# Patient Record
Sex: Female | Born: 1985
Health system: Southern US, Community
[De-identification: ages and names within clinical notes are randomized; demographics above are authoritative.]

## PROBLEM LIST (undated history)

## (undated) DIAGNOSIS — Z98891 History of uterine scar from previous surgery: Secondary | ICD-10-CM

## (undated) DIAGNOSIS — O3110X0 Continuing pregnancy after spontaneous abortion of one fetus or more, unspecified trimester, not applicable or unspecified: Secondary | ICD-10-CM

## (undated) DIAGNOSIS — F32A Depression, unspecified: Secondary | ICD-10-CM

## (undated) DIAGNOSIS — N942 Vaginismus: Secondary | ICD-10-CM

## (undated) DIAGNOSIS — E559 Vitamin D deficiency, unspecified: Secondary | ICD-10-CM

## (undated) DIAGNOSIS — K219 Gastro-esophageal reflux disease without esophagitis: Secondary | ICD-10-CM

## (undated) DIAGNOSIS — N979 Female infertility, unspecified: Secondary | ICD-10-CM

## (undated) DIAGNOSIS — Z8489 Family history of other specified conditions: Secondary | ICD-10-CM

## (undated) DIAGNOSIS — E282 Polycystic ovarian syndrome: Secondary | ICD-10-CM

## (undated) DIAGNOSIS — Z8759 Personal history of other complications of pregnancy, childbirth and the puerperium: Secondary | ICD-10-CM

## (undated) DIAGNOSIS — F419 Anxiety disorder, unspecified: Secondary | ICD-10-CM

## (undated) DIAGNOSIS — R7303 Prediabetes: Secondary | ICD-10-CM

## (undated) HISTORY — DX: Personal history of other complications of pregnancy, childbirth and the puerperium: Z87.59

## (undated) HISTORY — DX: Prediabetes: R73.03

## (undated) HISTORY — DX: Vaginismus: N94.2

## (undated) HISTORY — DX: Vitamin D deficiency, unspecified: E55.9

## (undated) HISTORY — DX: Anxiety disorder, unspecified: F41.9

## (undated) HISTORY — DX: History of uterine scar from previous surgery: Z98.891

## (undated) HISTORY — DX: Female infertility, unspecified: N97.9

## (undated) HISTORY — DX: Continuing pregnancy after spontaneous abortion of one fetus or more, unspecified trimester, not applicable or unspecified: O31.10X0

## (undated) HISTORY — DX: Gastro-esophageal reflux disease without esophagitis: K21.9

## (undated) HISTORY — DX: Polycystic ovarian syndrome: E28.2

## (undated) HISTORY — DX: Depression, unspecified: F32.A

---

## 2007-09-12 ENCOUNTER — Emergency Department (HOSPITAL_COMMUNITY): Admission: EM | Admit: 2007-09-12 | Discharge: 2007-09-12 | Payer: Self-pay | Admitting: Emergency Medicine

## 2009-05-08 ENCOUNTER — Other Ambulatory Visit: Admission: RE | Admit: 2009-05-08 | Discharge: 2009-05-08 | Payer: Self-pay | Admitting: Obstetrics and Gynecology

## 2010-05-05 ENCOUNTER — Encounter: Payer: Self-pay | Admitting: Emergency Medicine

## 2010-11-06 ENCOUNTER — Other Ambulatory Visit: Payer: Self-pay | Admitting: Otolaryngology

## 2010-11-12 ENCOUNTER — Ambulatory Visit
Admission: RE | Admit: 2010-11-12 | Discharge: 2010-11-12 | Disposition: A | Discharge status: Home / Self Care | Payer: BC Managed Care – PPO | Source: Ambulatory Visit | Attending: Otolaryngology | Admitting: Otolaryngology

## 2011-01-08 LAB — POCT PREGNANCY, URINE
Operator id: 244461
Preg Test, Ur: NEGATIVE

## 2012-07-28 ENCOUNTER — Ambulatory Visit: Payer: BC Managed Care – PPO | Admitting: Physical Therapy

## 2012-08-11 ENCOUNTER — Ambulatory Visit: Payer: BC Managed Care – PPO | Attending: Obstetrics and Gynecology | Admitting: Physical Therapy

## 2012-08-11 DIAGNOSIS — M2420 Disorder of ligament, unspecified site: Secondary | ICD-10-CM | POA: Insufficient documentation

## 2012-08-11 DIAGNOSIS — IMO0002 Reserved for concepts with insufficient information to code with codable children: Secondary | ICD-10-CM | POA: Insufficient documentation

## 2012-08-11 DIAGNOSIS — IMO0001 Reserved for inherently not codable concepts without codable children: Secondary | ICD-10-CM | POA: Insufficient documentation

## 2012-08-11 DIAGNOSIS — M629 Disorder of muscle, unspecified: Secondary | ICD-10-CM | POA: Insufficient documentation

## 2012-08-15 ENCOUNTER — Ambulatory Visit: Payer: BC Managed Care – PPO | Admitting: Physical Therapy

## 2012-08-23 ENCOUNTER — Ambulatory Visit: Payer: BC Managed Care – PPO | Admitting: Physical Therapy

## 2012-08-25 ENCOUNTER — Other Ambulatory Visit: Payer: Self-pay | Admitting: Family Medicine

## 2012-08-25 ENCOUNTER — Encounter: Payer: BC Managed Care – PPO | Admitting: Physical Therapy

## 2012-08-25 DIAGNOSIS — R635 Abnormal weight gain: Secondary | ICD-10-CM

## 2012-08-29 ENCOUNTER — Other Ambulatory Visit: Payer: BC Managed Care – PPO

## 2012-08-30 ENCOUNTER — Ambulatory Visit: Payer: BC Managed Care – PPO | Admitting: Physical Therapy

## 2012-09-01 ENCOUNTER — Ambulatory Visit: Payer: BC Managed Care – PPO | Admitting: Physical Therapy

## 2012-09-06 ENCOUNTER — Ambulatory Visit: Payer: BC Managed Care – PPO | Admitting: Physical Therapy

## 2012-09-08 ENCOUNTER — Ambulatory Visit: Payer: BC Managed Care – PPO | Admitting: Physical Therapy

## 2012-09-13 ENCOUNTER — Ambulatory Visit: Payer: BC Managed Care – PPO | Admitting: Physical Therapy

## 2012-09-15 ENCOUNTER — Ambulatory Visit: Payer: BC Managed Care – PPO | Attending: Obstetrics and Gynecology | Admitting: Physical Therapy

## 2012-09-15 DIAGNOSIS — M629 Disorder of muscle, unspecified: Secondary | ICD-10-CM | POA: Insufficient documentation

## 2012-09-15 DIAGNOSIS — IMO0001 Reserved for inherently not codable concepts without codable children: Secondary | ICD-10-CM | POA: Insufficient documentation

## 2012-09-15 DIAGNOSIS — IMO0002 Reserved for concepts with insufficient information to code with codable children: Secondary | ICD-10-CM | POA: Insufficient documentation

## 2012-09-15 DIAGNOSIS — M2420 Disorder of ligament, unspecified site: Secondary | ICD-10-CM | POA: Insufficient documentation

## 2012-09-19 ENCOUNTER — Other Ambulatory Visit: Payer: BC Managed Care – PPO

## 2012-09-20 ENCOUNTER — Ambulatory Visit: Payer: BC Managed Care – PPO | Admitting: Physical Therapy

## 2012-09-21 ENCOUNTER — Other Ambulatory Visit: Payer: BC Managed Care – PPO

## 2012-09-22 ENCOUNTER — Ambulatory Visit: Payer: BC Managed Care – PPO | Admitting: Physical Therapy

## 2012-09-27 ENCOUNTER — Ambulatory Visit: Payer: BC Managed Care – PPO | Admitting: Physical Therapy

## 2012-09-29 ENCOUNTER — Encounter: Payer: BC Managed Care – PPO | Admitting: Physical Therapy

## 2012-09-30 ENCOUNTER — Ambulatory Visit
Admission: RE | Admit: 2012-09-30 | Discharge: 2012-09-30 | Disposition: A | Discharge status: Home / Self Care | Payer: BC Managed Care – PPO | Source: Ambulatory Visit | Attending: Family Medicine | Admitting: Family Medicine

## 2012-09-30 DIAGNOSIS — R635 Abnormal weight gain: Secondary | ICD-10-CM

## 2012-10-04 ENCOUNTER — Ambulatory Visit: Payer: BC Managed Care – PPO | Admitting: Physical Therapy

## 2012-10-06 ENCOUNTER — Ambulatory Visit: Payer: BC Managed Care – PPO | Admitting: Physical Therapy

## 2013-01-04 ENCOUNTER — Emergency Department (HOSPITAL_COMMUNITY): Payer: BC Managed Care – PPO

## 2013-01-04 ENCOUNTER — Encounter (HOSPITAL_COMMUNITY): Payer: Self-pay | Admitting: Nurse Practitioner

## 2013-01-04 ENCOUNTER — Emergency Department (HOSPITAL_COMMUNITY)
Admission: EM | Admit: 2013-01-04 | Discharge: 2013-01-04 | Disposition: A | Discharge status: Home / Self Care | Payer: BC Managed Care – PPO | Attending: Emergency Medicine | Admitting: Emergency Medicine

## 2013-01-04 DIAGNOSIS — R109 Unspecified abdominal pain: Secondary | ICD-10-CM | POA: Insufficient documentation

## 2013-01-04 DIAGNOSIS — N949 Unspecified condition associated with female genital organs and menstrual cycle: Secondary | ICD-10-CM | POA: Insufficient documentation

## 2013-01-04 DIAGNOSIS — N921 Excessive and frequent menstruation with irregular cycle: Secondary | ICD-10-CM | POA: Insufficient documentation

## 2013-01-04 DIAGNOSIS — R102 Pelvic and perineal pain: Secondary | ICD-10-CM

## 2013-01-04 DIAGNOSIS — N898 Other specified noninflammatory disorders of vagina: Secondary | ICD-10-CM | POA: Insufficient documentation

## 2013-01-04 DIAGNOSIS — Z3202 Encounter for pregnancy test, result negative: Secondary | ICD-10-CM | POA: Insufficient documentation

## 2013-01-04 LAB — URINE MICROSCOPIC-ADD ON

## 2013-01-04 LAB — URINALYSIS, ROUTINE W REFLEX MICROSCOPIC
Bilirubin Urine: NEGATIVE
Ketones, ur: NEGATIVE mg/dL
Nitrite: NEGATIVE
Urobilinogen, UA: 0.2 mg/dL (ref 0.0–1.0)
pH: 5 (ref 5.0–8.0)

## 2013-01-04 LAB — POCT PREGNANCY, URINE: Preg Test, Ur: NEGATIVE

## 2013-01-04 LAB — WET PREP, GENITAL: Trich, Wet Prep: NONE SEEN

## 2013-01-04 MED ORDER — HYDROCODONE-ACETAMINOPHEN 5-325 MG PO TABS
1.0000 | ORAL_TABLET | Freq: Once | ORAL | Status: AC
  Administered 2013-01-04: 1 via ORAL
  Filled 2013-01-04: qty 1

## 2013-01-04 MED ORDER — PHENAZOPYRIDINE HCL 200 MG PO TABS: 200.0000 mg | ORAL_TABLET | Freq: Three times a day (TID) | ORAL | Status: DC

## 2013-01-04 MED ORDER — PHENAZOPYRIDINE HCL 100 MG PO TABS
100.0000 mg | ORAL_TABLET | Freq: Once | ORAL | Status: AC
  Administered 2013-01-04: 100 mg via ORAL
  Filled 2013-01-04: qty 1

## 2013-01-04 MED ORDER — TRAMADOL HCL 50 MG PO TABS: 50.0000 mg | ORAL_TABLET | Freq: Four times a day (QID) | ORAL | Status: DC | PRN

## 2013-01-04 NOTE — ED Provider Notes (Signed)
CSN: [id number]     Arrival date & time 01/04/13  1044 History   First MD Initiated Contact with Patient 01/04/13 1201     Chief Complaint  Patient presents with  . Pelvic Pain   (Consider location/radiation/quality/duration/timing/severity/associated sxs/prior Treatment) Patient is a 27 y.o. female presenting with pelvic pain. The history is provided by the patient and medical records.  Pelvic Pain  Patient presents to the ED for pelvic pain and abnormal menstrual cycle. Patient states she has not had a menstrual cycle in over 2 months but has had some slight spotting. She states her cycles are abnormal but she never goes more than 1 month without a cycle. She's not currently on any type of birth control. Patient has taken 2 home pregnancy tests, both of which were negative. Patient states she is followed by OB-GYN, Dr. Vickey Sages, but has not had a pap smear this year due to pelvic spasms. Her OB/GYN sent her to physical therapy, which she completed but did not notice any improvement.  Pt states pain starts below her navel and progresses all the way down to the entrance of her vagina.  Pain described as a sharp sensation, exacerbated during intercourse.  Has had some intermittent nausea but no vomiting.  No urinary sx or vaginal discharge.  No new sexual partners or concern for STD.  BM and PO intake normal.  Denies hx of ovarian cysts, endometriosis, or other GYN conditions.  No recent fevers, sweats, or chills.  History reviewed. No pertinent past medical history. History reviewed. No pertinent past surgical history. History reviewed. No pertinent family history. History  Substance Use Topics  . Smoking status: Never Smoker   . Smokeless tobacco: Not on file  . Alcohol Use: No   OB History   Grav Para Term Preterm Abortions TAB SAB Ect Mult Living                 Review of Systems  Genitourinary: Positive for menstrual problem and pelvic pain.  All other systems reviewed and are  negative.    Allergies  Lamisil  Home Medications   Current Outpatient Rx  Name  Route  Sig  Dispense  Refill  . famotidine (PEPCID AC) 10 MG chewable tablet   Oral   Chew 10 mg by mouth 2 (two) times daily as needed for heartburn.         . IBUPROFEN CHILDRENS PO   Oral   Take 30 mLs by mouth daily as needed (headache).         . Pediatric Multiple Vit-C-FA (FLINSTONES GUMMIES OMEGA-3 DHA PO)   Oral   Take 1 each by mouth daily.          BP 137/96  Pulse 73  Temp(Src) 98.1 F (36.7 C) (Oral)  Resp 18  SpO2 100%  Physical Exam  Nursing note and vitals reviewed. Constitutional: She is oriented to person, place, and time. She appears well-developed and well-nourished.  HENT:  Head: Normocephalic and atraumatic.  Mouth/Throat: Oropharynx is clear and moist.  Eyes: Conjunctivae and EOM are normal. Pupils are equal, round, and reactive to light.  Neck: Normal range of motion.  Cardiovascular: Normal rate, regular rhythm and normal heart sounds.   Pulmonary/Chest: Effort normal and breath sounds normal.  Abdominal: Soft. Bowel sounds are normal. There is tenderness in the suprapubic area. There is no guarding, no CVA tenderness, no tenderness at McBurney's point and negative Murphy's sign.    Genitourinary: Vaginal discharge found.  Pt  could not tolerate speculum or bimanual exam; scant purulent vaginal discharge at anterior introitus  Musculoskeletal: Normal range of motion.  Neurological: She is alert and oriented to person, place, and time.  Skin: Skin is warm and dry.  Psychiatric: She has a normal mood and affect.    ED Course  Procedures (including critical care time) Labs Review Labs Reviewed  URINALYSIS, ROUTINE W REFLEX MICROSCOPIC - Abnormal; Notable for the following:    Leukocytes, UA SMALL (*)    All other components within normal limits  URINE MICROSCOPIC-ADD ON - Abnormal; Notable for the following:    Squamous Epithelial / LPF FEW (*)     Bacteria, UA FEW (*)    All other components within normal limits  URINE CULTURE  GC/CHLAMYDIA PROBE AMP  WET PREP, GENITAL  POCT PREGNANCY, URINE   Imaging Review No results found.  MDM   1. Pelvic pain     u-preg negative.  U/a without signs of infection, culture pending.  Given vicodin and pyrdium prior to pelvic exam.  1:18 PM Pt continually tensed up and moved away from me during pelvic exam.  Could not tolerate speculum or bimanual exam but cultures were obtained.  Advised pt that she will likely will not be able to tolerate pelvic u/s but she would like to try.  2:59 PM Notified by ultrasound that pt could not tolerate pelvic u/s despite numerous tries-- repeated tensing and moving away as before.  Discussed with pt, sx improved after meds and does not want any other invasive procedures at this time.  Pt afebrile, non-toxic appearing, NAD, VS stable.  I doubt acute/surgical abdomen at this time including but not limited to TOA, ovarian torsion, appendicitis, SBO.  She has previously scheduled FU with her OB-GYN on oct 7th-- instructed to keep this appt and discuss other options for possible imaging.  Rx tramadol and pyridium.  Discussed plan with pt, she agreed.  Return precautions advised.  Garlon Hatchet, PA-C 01/04/13 1557

## 2013-01-04 NOTE — ED Provider Notes (Signed)
Medical screening examination/treatment/procedure(s) were performed by non-physician practitioner and as supervising physician I was immediately available for consultation/collaboration.   Layla Maw Candi Profit, DO 01/04/13 1558

## 2013-01-04 NOTE — ED Notes (Addendum)
States she has not had a period since July 13, had 2 different days where she noticed a light spotting on tissue paper but no full cycle. Took 2 home pregnancy tests at home that were negative. Pt reports she did not have her pap smear this year because she had too many spasms to obtain culture and OBGYN sent her to physical therapy to try to reduce spasms. Pt reports she has been having abd pain from navel to pelvis that increases with intercourse.

## 2013-01-05 LAB — GC/CHLAMYDIA PROBE AMP
CT Probe RNA: NEGATIVE
GC Probe RNA: NEGATIVE

## 2013-01-05 LAB — URINE CULTURE: Colony Count: 50000

## 2013-03-08 DIAGNOSIS — I10 Essential (primary) hypertension: Secondary | ICD-10-CM | POA: Insufficient documentation

## 2013-03-08 DIAGNOSIS — Z7189 Other specified counseling: Secondary | ICD-10-CM | POA: Insufficient documentation

## 2013-03-08 DIAGNOSIS — E282 Polycystic ovarian syndrome: Secondary | ICD-10-CM | POA: Insufficient documentation

## 2013-08-04 ENCOUNTER — Encounter (HOSPITAL_COMMUNITY): Payer: Self-pay | Admitting: Emergency Medicine

## 2013-08-04 DIAGNOSIS — R55 Syncope and collapse: Secondary | ICD-10-CM | POA: Insufficient documentation

## 2013-08-04 DIAGNOSIS — Z79899 Other long term (current) drug therapy: Secondary | ICD-10-CM | POA: Insufficient documentation

## 2013-08-04 DIAGNOSIS — R42 Dizziness and giddiness: Secondary | ICD-10-CM | POA: Insufficient documentation

## 2013-08-04 DIAGNOSIS — R110 Nausea: Secondary | ICD-10-CM | POA: Insufficient documentation

## 2013-08-04 DIAGNOSIS — R0789 Other chest pain: Secondary | ICD-10-CM | POA: Insufficient documentation

## 2013-08-04 DIAGNOSIS — Z3202 Encounter for pregnancy test, result negative: Secondary | ICD-10-CM | POA: Insufficient documentation

## 2013-08-04 LAB — BASIC METABOLIC PANEL
BUN: 8 mg/dL (ref 6–23)
CO2: 24 mEq/L (ref 19–32)
Calcium: 9.7 mg/dL (ref 8.4–10.5)
Chloride: 104 mEq/L (ref 96–112)
Creatinine, Ser: 0.83 mg/dL (ref 0.50–1.10)
Glucose, Bld: 110 mg/dL — ABNORMAL HIGH (ref 70–99)
POTASSIUM: 3.5 meq/L — AB (ref 3.7–5.3)
SODIUM: 142 meq/L (ref 137–147)

## 2013-08-04 LAB — CBC
HCT: 40.1 % (ref 36.0–46.0)
Hemoglobin: 13.5 g/dL (ref 12.0–15.0)
MCH: 29.2 pg (ref 26.0–34.0)
MCHC: 33.7 g/dL (ref 30.0–36.0)
MCV: 86.8 fL (ref 78.0–100.0)
Platelets: 427 10*3/uL — ABNORMAL HIGH (ref 150–400)
RBC: 4.62 MIL/uL (ref 3.87–5.11)
RDW: 13.1 % (ref 11.5–15.5)
WBC: 14.2 10*3/uL — ABNORMAL HIGH (ref 4.0–10.5)

## 2013-08-04 LAB — I-STAT TROPONIN, ED: Troponin i, poc: 0.01 ng/mL (ref 0.00–0.08)

## 2013-08-04 NOTE — ED Notes (Signed)
Pt. reports intermittent mid chest tightness with nausea and dizziness for 3 days . Denies SOB or diaphoresis .

## 2013-08-05 ENCOUNTER — Emergency Department (HOSPITAL_COMMUNITY)
Admission: EM | Admit: 2013-08-05 | Discharge: 2013-08-05 | Disposition: A | Discharge status: Home / Self Care | Payer: BC Managed Care – PPO | Attending: Emergency Medicine | Admitting: Emergency Medicine

## 2013-08-05 DIAGNOSIS — R110 Nausea: Secondary | ICD-10-CM

## 2013-08-05 DIAGNOSIS — R42 Dizziness and giddiness: Secondary | ICD-10-CM

## 2013-08-05 LAB — URINALYSIS, ROUTINE W REFLEX MICROSCOPIC
Bilirubin Urine: NEGATIVE
Glucose, UA: NEGATIVE mg/dL
KETONES UR: NEGATIVE mg/dL
Nitrite: NEGATIVE
PH: 5.5 (ref 5.0–8.0)
Protein, ur: NEGATIVE mg/dL
SPECIFIC GRAVITY, URINE: 1.027 (ref 1.005–1.030)
Urobilinogen, UA: 0.2 mg/dL (ref 0.0–1.0)

## 2013-08-05 LAB — DIFFERENTIAL
BASOS ABS: 0 10*3/uL (ref 0.0–0.1)
Basophils Relative: 0 % (ref 0–1)
Eosinophils Absolute: 0 10*3/uL (ref 0.0–0.7)
Eosinophils Relative: 0 % (ref 0–5)
LYMPHS ABS: 3.5 10*3/uL (ref 0.7–4.0)
LYMPHS PCT: 25 % (ref 12–46)
Monocytes Absolute: 0.8 10*3/uL (ref 0.1–1.0)
Monocytes Relative: 6 % (ref 3–12)
NEUTROS ABS: 9.4 10*3/uL — AB (ref 1.7–7.7)
Neutrophils Relative %: 69 % (ref 43–77)

## 2013-08-05 LAB — POC URINE PREG, ED: PREG TEST UR: NEGATIVE

## 2013-08-05 LAB — URINE MICROSCOPIC-ADD ON

## 2013-08-05 MED ORDER — PANTOPRAZOLE SODIUM 40 MG PO TBEC
40.0000 mg | DELAYED_RELEASE_TABLET | Freq: Every day | ORAL | Status: DC
  Filled 2013-08-05: qty 1

## 2013-08-05 MED ORDER — ONDANSETRON 8 MG PO TBDP: 8.0000 mg | ORAL_TABLET | Freq: Three times a day (TID) | ORAL | Status: DC | PRN

## 2013-08-05 MED ORDER — PANTOPRAZOLE SODIUM 40 MG PO TBEC
40.0000 mg | DELAYED_RELEASE_TABLET | Freq: Once | ORAL | Status: AC
  Administered 2013-08-05: 40 mg via ORAL

## 2013-08-05 MED ORDER — MECLIZINE HCL 25 MG PO TABS
25.0000 mg | ORAL_TABLET | Freq: Once | ORAL | Status: AC
  Administered 2013-08-05: 25 mg via ORAL
  Filled 2013-08-05: qty 1

## 2013-08-05 MED ORDER — LANSOPRAZOLE 30 MG PO TBDP: 30.0000 mg | ORAL_TABLET | Freq: Every day | ORAL | Status: DC

## 2013-08-05 MED ORDER — MECLIZINE HCL 50 MG PO TABS: 50.0000 mg | ORAL_TABLET | Freq: Three times a day (TID) | ORAL | Status: DC | PRN

## 2013-08-05 MED ORDER — ONDANSETRON 4 MG PO TBDP
8.0000 mg | ORAL_TABLET | Freq: Once | ORAL | Status: AC
  Administered 2013-08-05: 8 mg via ORAL
  Filled 2013-08-05: qty 2

## 2013-08-05 NOTE — ED Provider Notes (Signed)
CSN: [id number]     Arrival date & time 08/04/13  2230 History   First MD Initiated Contact with Patient 08/05/13 [phone]     No chief complaint on file.    (Consider location/radiation/quality/duration/timing/severity/associated sxs/prior Treatment) HPI 28 year old female presents to emergency room from home with complaint of dizziness, nausea, chest tightness.  Symptoms ongoing for the last 3 days, worse over the last 24 hours.  Patient describes dizziness as both vertigo and near syncope.  Symptoms are worse when she is standing.  No fever, chills.  No vomiting, or diarrhea.  Patient, reports she's had decreased appetite, and has not been eating or drinking well, secondary to feeling nauseated.  Patient reports she has history of PCOS., has had her menstrual cycle, on for last 8 days.  No recent URI symptoms.  No ear pain, no headache, no chest pain, no abdominal pain.  No urinary symptoms. History reviewed. No pertinent past medical history. History reviewed. No pertinent past surgical history. No family history on file. History  Substance Use Topics  . Smoking status: Never Smoker   . Smokeless tobacco: Not on file  . Alcohol Use: No   OB History   Grav Para Term Preterm Abortions TAB SAB Ect Mult Living                 Review of Systems   See History of Present Illness; otherwise all other systems are reviewed and negative  Allergies  Lamisil  Home Medications   Prior to Admission medications   Medication Sig Start Date End Date Taking? Authorizing Provider  BLACK COHOSH PO Take 1 tablet by mouth daily.   Yes Historical Provider, MD  ibuprofen (ADVIL,MOTRIN) 200 MG tablet Take 200 mg by mouth every 6 (six) hours as needed for mild pain.   Yes Historical Provider, MD  IBUPROFEN CHILDRENS PO Take 30 mLs by mouth daily as needed (headache).   Yes Historical Provider, MD  IRON PO Take 1 tablet by mouth daily.   Yes Historical Provider, MD  MAGNESIUM PO Take 1 tablet by mouth  daily.   Yes Historical Provider, MD  Pediatric Multivit-Minerals-C (CHILDRENS VITAMINS PO) Take 1 tablet by mouth daily.   Yes Historical Provider, MD  tiZANidine (ZANAFLEX) 4 MG tablet Take 4 mg by mouth every 6 (six) hours as needed for muscle spasms.   Yes Historical Provider, MD   BP 111/70  Pulse 60  Temp(Src) 97.2 F (36.2 C) (Oral)  Resp 18  Ht 5\' 4"  (1.626 m)  Wt 193 lb (87.544 kg)  BMI 33.11 kg/m2  SpO2 98%  LMP 07/24/2013 Physical Exam  Nursing note and vitals reviewed. Constitutional: She is oriented to person, place, and time. She appears well-developed and well-nourished. She appears distressed (uncomfortable appearing).  HENT:  Head: Normocephalic and atraumatic.  Right Ear: External ear normal.  Left Ear: External ear normal.  Nose: Nose normal.  Dry mucous membranes  Eyes: Conjunctivae and EOM are normal. Pupils are equal, round, and reactive to light.  Neck: Normal range of motion. Neck supple. No JVD present. No tracheal deviation present. No thyromegaly present.  Cardiovascular: Normal rate, regular rhythm, normal heart sounds and intact distal pulses.  Exam reveals no gallop and no friction rub.   No murmur heard. Pulmonary/Chest: Effort normal and breath sounds normal. No stridor. No respiratory distress. She has no wheezes. She has no rales. She exhibits no tenderness.  Abdominal: Soft. Bowel sounds are normal. She exhibits no distension and no mass. There  is no tenderness. There is no rebound and no guarding.  Musculoskeletal: Normal range of motion. She exhibits no edema and no tenderness.  Lymphadenopathy:    She has no cervical adenopathy.  Neurological: She is alert and oriented to person, place, and time. She has normal reflexes. No cranial nerve deficit. She exhibits normal muscle tone. Coordination normal.  Skin: Skin is warm and dry. No rash noted. No erythema. No pallor.  Psychiatric: She has a normal mood and affect. Her behavior is normal. Judgment  and thought content normal.    ED Course  Procedures (including critical care time) Labs Review Labs Reviewed  CBC - Abnormal; Notable for the following:    WBC 14.2 (*)    Platelets 427 (*)    All other components within normal limits  BASIC METABOLIC PANEL - Abnormal; Notable for the following:    Potassium 3.5 (*)    Glucose, Bld 110 (*)    All other components within normal limits  URINALYSIS, ROUTINE W REFLEX MICROSCOPIC - Abnormal; Notable for the following:    APPearance CLOUDY (*)    Hgb urine dipstick LARGE (*)    Leukocytes, UA SMALL (*)    All other components within normal limits  DIFFERENTIAL - Abnormal; Notable for the following:    Neutro Abs 9.4 (*)    All other components within normal limits  URINE MICROSCOPIC-ADD ON  I-STAT TROPOININ, ED  POC URINE PREG, ED    Imaging Review No results found.   EKG Interpretation   Date/Time:  Friday August 04 2013 22:33:42 EDT Ventricular Rate:  97 PR Interval:  128 QRS Duration: 84 QT Interval:  346 QTC Calculation: 439 R Axis:   64 Text Interpretation:  Normal sinus rhythm Normal ECG Confirmed by Baylin Cabal   MD, Marge Vandermeulen ([id number]) on 08/05/2013 3:23:03 AM      MDM   Final diagnoses:  Dizziness  Nausea   28 year old female with, dizziness, nausea for 3 days.  Mild elevation in her white blood cell count.  Patient's physical exam is normal, aside from mild dehydration.  No signs of urinary tract infection.  EKG is normal.  We'll instruct patient to increase fluid intake, prescribed at the scene, and Zofran.  Suspect this is a viral process, we'll refer her to primary care Dr. if symptoms are not improving.    Olivia Mackielga M Jary Louvier, MD 08/05/13 [phone]

## 2013-08-05 NOTE — ED Notes (Signed)
Family at bedside. 

## 2013-08-05 NOTE — Discharge Instructions (Signed)
Increase your fluid intake.  Stick to a bland diet, until you're feeling better.  It is important for you to the injury.  To help with her symptoms of dizziness and nausea.  Take medication as needed.  Followup with a primary care provider in 3-5 days.  Contact your insurance company for local providers.   Dizziness Dizziness is a common problem. It is a feeling of unsteadiness or lightheadedness. You may feel like you are about to faint. Dizziness can lead to injury if you stumble or fall. A person of any age group can suffer from dizziness, but dizziness is more common in older adults. CAUSES  Dizziness can be caused by many different things, including:  Middle ear problems.  Standing for too long.  Infections.  An allergic reaction.  Aging.  An emotional response to something, such as the sight of blood.  Side effects of medicines.  Fatigue.  Problems with circulation or blood pressure.  Excess use of alcohol, medicines, or illegal drug use.  Breathing too fast (hyperventilation).  An arrhythmia or problems with your heart rhythm.  Low red blood cell count (anemia).  Pregnancy.  Vomiting, diarrhea, fever, or other illnesses that cause dehydration.  Diseases or conditions such as Parkinson's disease, high blood pressure (hypertension), diabetes, and thyroid problems.  Exposure to extreme heat. DIAGNOSIS  To find the cause of your dizziness, your caregiver may do a physical exam, lab tests, radiologic imaging scans, or an electrocardiography test (ECG).  TREATMENT  Treatment of dizziness depends on the cause of your symptoms and can vary greatly. HOME CARE INSTRUCTIONS   Drink enough fluids to keep your urine clear or pale yellow. This is especially important in very hot weather. In the elderly, it is also important in cold weather.  If your dizziness is caused by medicines, take them exactly as directed. When taking blood pressure medicines, it is especially important  to get up slowly.  Rise slowly from chairs and steady yourself until you feel okay.  In the morning, first sit up on the side of the bed. When this seems okay, stand slowly while holding onto something until you know your balance is fine.  If you need to stand in one place for a long time, be sure to move your legs often. Tighten and relax the muscles in your legs while standing.  If dizziness continues to be a problem, have someone stay with you for a day or two. Do this until you feel you are well enough to stay alone. Have the person call your caregiver if he or she notices changes in you that are concerning.  Do not drive or use heavy machinery if you feel dizzy.  Do not drink alcohol. SEEK IMMEDIATE MEDICAL CARE IF:   Your dizziness or lightheadedness gets worse.  You feel nauseous or vomit.  You develop problems with talking, walking, weakness, or using your arms, hands, or legs.  You are not thinking clearly or you have difficulty forming sentences. It may take a friend or family member to determine if your thinking is normal.  You develop chest pain, abdominal pain, shortness of breath, or sweating.  Your vision changes.  You notice any bleeding.  You have side effects from medicine that seems to be getting worse rather than better. MAKE SURE YOU:   Understand these instructions.  Will watch your condition.  Will get help right away if you are not doing well or get worse. Document Released: 09/23/2000 Document Revised: 06/22/2011 Document  Reviewed: 10/17/2010 ExitCare Patient Information 2014 [place].  Nausea, Adult Nausea is the feeling that you have an upset stomach or have to vomit. Nausea by itself is not likely a serious concern, but it may be an early sign of more serious medical problems. As nausea gets worse, it can lead to vomiting. If vomiting develops, there is the risk of dehydration.  CAUSES   Viral infections.  Food  poisoning.  Medicines.  Pregnancy.  Motion sickness.  Migraine headaches.  Emotional distress.  Severe pain from any source.  Alcohol intoxication. HOME CARE INSTRUCTIONS  Get plenty of rest.  Ask your caregiver about specific rehydration instructions.  Eat small amounts of food and sip liquids more often.  Take all medicines as told by your caregiver. SEEK MEDICAL CARE IF:  You have not improved after 2 days, or you get worse.  You have a headache. SEEK IMMEDIATE MEDICAL CARE IF:   You have a fever.  You faint.  You keep vomiting or have blood in your vomit.  You are extremely weak or dehydrated.  You have dark or bloody stools.  You have severe chest or abdominal pain. MAKE SURE YOU:  Understand these instructions.  Will watch your condition.  Will get help right away if you are not doing well or get worse. Document Released: 05/07/2004 Document Revised: 12/23/2011 Document Reviewed: 12/10/2010 [facility] Patient Information 2014 [place], [place].

## 2015-11-08 ENCOUNTER — Ambulatory Visit: Payer: Self-pay | Admitting: Podiatry

## 2015-11-12 ENCOUNTER — Ambulatory Visit: Payer: Self-pay | Admitting: Podiatry

## 2016-06-10 ENCOUNTER — Ambulatory Visit (INDEPENDENT_AMBULATORY_CARE_PROVIDER_SITE_OTHER): Payer: BLUE CROSS/BLUE SHIELD | Admitting: Podiatry

## 2016-06-10 DIAGNOSIS — Z79899 Other long term (current) drug therapy: Secondary | ICD-10-CM

## 2016-06-10 DIAGNOSIS — B351 Tinea unguium: Secondary | ICD-10-CM

## 2016-06-10 DIAGNOSIS — L608 Other nail disorders: Secondary | ICD-10-CM

## 2016-06-10 DIAGNOSIS — L603 Nail dystrophy: Secondary | ICD-10-CM | POA: Diagnosis not present

## 2016-06-10 DIAGNOSIS — M79609 Pain in unspecified limb: Secondary | ICD-10-CM | POA: Diagnosis not present

## 2016-06-12 ENCOUNTER — Other Ambulatory Visit: Payer: Self-pay | Admitting: Podiatry

## 2016-06-12 LAB — HEPATIC FUNCTION PANEL
ALBUMIN: 4 g/dL (ref 3.5–5.5)
ALK PHOS: 89 IU/L (ref 39–117)
ALT: 9 IU/L (ref 0–32)
AST: 12 IU/L (ref 0–40)
Bilirubin Total: 0.2 mg/dL (ref 0.0–1.2)
Bilirubin, Direct: <0.1 mg/dL (ref 0.00–0.40)
TOTAL PROTEIN: 6.8 g/dL (ref 6.0–8.5)

## 2016-06-13 NOTE — Progress Notes (Signed)
   Subjective: Patient presents today for possible treatment and evaluation of fungal nails bilaterally 1 through 5. Patient states that the nails have been discolored and thickened for greater than 1 month. Patient presents today for further treatment and evaluation. Patient states that she did have a pedicure in 2011 at which time she developed toenail fungus. Patient also has an allergy to Lamisil/hives  Objective: Physical Exam General: The patient is alert and oriented x3 in no acute distress.  Dermatology: Hyperkeratotic, discolored, thickened, onychodystrophy of nails noted bilaterally.  Skin is warm, dry and supple bilateral lower extremities. Negative for open lesions or macerations.  Vascular: Palpable pedal pulses bilaterally. No edema or erythema noted. Capillary refill within normal limits.  Neurological: Epicritic and protective threshold grossly intact bilaterally.   Musculoskeletal Exam: Range of motion within normal limits to all pedal and ankle joints bilateral. Muscle strength 5/5 in all groups bilateral.   Assessment: #1 onychodystrophy bilateral toenails #2 possible onychomycosis #3 hyperkeratotic nails bilateral  Plan of Care:  #1 Patient was evaluated. #2 Orders for liver function tests were ordered today.  #3 Today nail biopsy was taken and sent to pathology for fungal culture. #5 patient is going to schedule EPAT with jessica quintana #6 if fungal results are (+) onychomycosis, we will prescribe Sporonox and antifungal nail lacquer through [facility] #7 RTC 4 months  Felecia ShellingBrent M. Eaton Folmar, DPM [facility]  Dr. Felecia ShellingBrent M. Lakeem Rozo, DPM    [address]                                        [place], [place] [address]                Office [phone]  Fax [phone]

## 2016-06-16 ENCOUNTER — Ambulatory Visit: Payer: BLUE CROSS/BLUE SHIELD | Admitting: Podiatry

## 2016-06-16 DIAGNOSIS — B351 Tinea unguium: Secondary | ICD-10-CM

## 2016-06-17 NOTE — Progress Notes (Signed)
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 All safety precautions were in place. Discussed hep fx results with patient and informed her that the results were WNL but we will need the results from her nail culture before we can write her an oral medication. Clinic [facility]. Re-appointed in 4 weeks for 2nd treatment

## 2016-06-25 NOTE — Progress Notes (Signed)
Please contact patient and let them know they are positive for fungus. As soon as the patient gets her liver function test we can order Sporanox and the antifungal nail lacquer.Thanks, Dr. Logan BoresEvans

## 2016-06-25 NOTE — Progress Notes (Signed)
Please disregard previous message. Patient is positive for fungus and liver function test is normal.  Please prescribe Sporanox #90 QD and [facility]. Return to clinic in 4 months.  Dr. Logan BoresEvans.

## 2016-07-17 ENCOUNTER — Telehealth: Payer: Self-pay | Admitting: *Deleted

## 2016-07-17 NOTE — Telephone Encounter (Signed)
Yes, continue topical.  Dr. Logan Bores

## 2016-07-17 NOTE — Telephone Encounter (Addendum)
Pt states she returning a call from her liver function test and was told they were fine,can she get the medication for the fungus without an appt. I spoke with pt and she states she had a reaction to the lamisil oral. I told her I would check if Dr. Logan Bores wanted her to use the topical with lamisil. 07/20/2016-I informed pt of Dr. Logan Bores okay to use the topical [facility] Onychomycosis Nail Lacquer. Faxed orders to [employer].

## 2016-07-20 ENCOUNTER — Ambulatory Visit (INDEPENDENT_AMBULATORY_CARE_PROVIDER_SITE_OTHER): Payer: BLUE CROSS/BLUE SHIELD | Admitting: Podiatry

## 2016-07-20 ENCOUNTER — Encounter: Payer: Self-pay | Admitting: Podiatry

## 2016-07-20 DIAGNOSIS — R52 Pain, unspecified: Secondary | ICD-10-CM

## 2016-07-20 DIAGNOSIS — M79609 Pain in unspecified limb: Secondary | ICD-10-CM

## 2016-07-20 DIAGNOSIS — B351 Tinea unguium: Secondary | ICD-10-CM

## 2016-07-20 MED ORDER — NONFORMULARY OR COMPOUNDED ITEM: 2 refills | Status: DC

## 2016-07-24 NOTE — Progress Notes (Signed)
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 All safety precautions were in place. Clinic will call when [facility] results are in. Re-appointed in 4 weeks for 3rd treatment

## 2016-07-31 ENCOUNTER — Ambulatory Visit (HOSPITAL_COMMUNITY)
Admission: EM | Admit: 2016-07-31 | Discharge: 2016-07-31 | Disposition: A | Discharge status: Home / Self Care | Payer: BLUE CROSS/BLUE SHIELD

## 2016-07-31 ENCOUNTER — Encounter (HOSPITAL_COMMUNITY): Payer: Self-pay | Admitting: Emergency Medicine

## 2016-07-31 DIAGNOSIS — H8309 Labyrinthitis, unspecified ear: Secondary | ICD-10-CM | POA: Diagnosis not present

## 2016-07-31 DIAGNOSIS — R42 Dizziness and giddiness: Secondary | ICD-10-CM | POA: Diagnosis not present

## 2016-07-31 MED ORDER — ONDANSETRON HCL 4 MG PO TABS: 4.0000 mg | ORAL_TABLET | Freq: Four times a day (QID) | ORAL | 0 refills | Status: DC

## 2016-07-31 MED ORDER — MECLIZINE HCL 25 MG PO TABS: 25.0000 mg | ORAL_TABLET | Freq: Three times a day (TID) | ORAL | 0 refills | Status: DC | PRN

## 2016-07-31 NOTE — ED Provider Notes (Signed)
CSN: [id number]     Arrival date & time 07/31/16  1936 History   First MD Initiated Contact with Patient 07/31/16 2028     Chief Complaint  Patient presents with  . Headache   (Consider location/radiation/quality/duration/timing/severity/associated sxs/prior Treatment) 31 year old female complaining of dizziness, lightheadedness and headache for one week. Headache is located to the top of the head. She takes Tylenol and sometimes that helps with her headache help her to feel better. The dizziness is worse with movement. When she gets up in the morning she feels fairly well but when she starts moving around the dizziness is worse. Occasionally she has nausea. At the end of the history the patient volunteers information that she has had 2 similar episodes in the past. The last episode was called BPV. He states she had vertigo then but not this time.      History reviewed. No pertinent past medical history. History reviewed. No pertinent surgical history. History reviewed. No pertinent family history. Social History  Substance Use Topics  . Smoking status: Never Smoker  . Smokeless tobacco: Not on file  . Alcohol use No   OB History    No data available     Review of Systems  Constitutional: Positive for activity change. Negative for fever.  HENT: Negative.   Eyes: Negative.   Respiratory: Negative.   Gastrointestinal: Positive for nausea.  Genitourinary: Negative.   Musculoskeletal: Negative.   Skin: Negative.   Neurological: Positive for dizziness and headaches. Negative for syncope, facial asymmetry and speech difficulty.    Allergies  Lamisil [terbinafine hcl]  Home Medications   Prior to Admission medications   Medication Sig Start Date End Date Taking? Authorizing Provider  phentermine (ADIPEX-P) 37.5 MG tablet Take 37.5 mg by mouth daily before breakfast.   Yes Historical Provider, MD  BLACK COHOSH PO Take 1 tablet by mouth daily.    Historical Provider, MD   Calcium-Phosphorus-Vitamin D (CALCIUM GUMMIES PO) Take by mouth.    Historical Provider, MD  Cholecalciferol (VITAMIN D3) 2000 units CHEW Chew by mouth.    Historical Provider, MD  Cyanocobalamin (B-12 PO) Take by mouth.    Historical Provider, MD  ibuprofen (ADVIL,MOTRIN) 200 MG tablet Take 200 mg by mouth every 6 (six) hours as needed for mild pain.    Historical Provider, MD  IBUPROFEN CHILDRENS PO Take 30 mLs by mouth daily as needed (headache).    Historical Provider, MD  IRON PO Take 1 tablet by mouth daily.    Historical Provider, MD  MAGNESIUM PO Take 1 tablet by mouth daily.    Historical Provider, MD  meclizine (ANTIVERT) 25 MG tablet Take 1 tablet (25 mg total) by mouth 3 (three) times daily as needed for dizziness. 07/31/16   Hayden Rasmussen, NP  Multiple Vitamins-Minerals (MULTIVITAMIN PO) Take by mouth.    Historical Provider, MD  NONFORMULARY OR COMPOUNDED ITEM [facility]:  Onychomycosis Nail Lacquer - Fluconazole 2%, Terbinafine 1%, DMSO, apply to affected areas daily. 07/20/16   Felecia Shelling, DPM  ondansetron (ZOFRAN) 4 MG tablet Take 1 tablet (4 mg total) by mouth every 6 (six) hours. 07/31/16   Hayden Rasmussen, NP  Pediatric Multivit-Minerals-C (CHILDRENS VITAMINS PO) Take 1 tablet by mouth daily.    Historical Provider, MD  Prenatal Vit-Fe Fumarate-FA (PRENATAL VITAMIN PO) Take by mouth.    Historical Provider, MD  Probiotic Product (PROBIOTIC DAILY PO) Take by mouth.    Historical Provider, MD  tiZANidine (ZANAFLEX) 4 MG tablet Take 4 mg  by mouth every 6 (six) hours as needed for muscle spasms.    Historical Provider, MD   Meds Ordered and Administered this Visit  Medications - No data to display  BP 137/90 (BP Location: Right Arm)   Pulse 75   Temp 98.1 F (36.7 C) (Oral)   Resp 20   SpO2 100%  No data found.   Physical Exam  Constitutional: She is oriented to person, place, and time. She appears well-developed and well-nourished. No distress.  HENT:  Head:  Normocephalic and atraumatic.  Mouth/Throat: Oropharynx is clear and moist. No oropharyngeal exudate.  Bilateral TMs are normal.  Eyes: Conjunctivae and EOM are normal. Pupils are equal, round, and reactive to light. Right eye exhibits no discharge. Left eye exhibits no discharge.  No nystagmus.  Neck: Normal range of motion. Neck supple.  Cardiovascular: Normal rate, regular rhythm, normal heart sounds and intact distal pulses.   No murmur heard. Pulmonary/Chest: Effort normal and breath sounds normal. No respiratory distress.  Abdominal: Soft. There is no tenderness.  Musculoskeletal: Normal range of motion. She exhibits no edema or tenderness.  Lymphadenopathy:    She has no cervical adenopathy.  Neurological: She is alert and oriented to person, place, and time. She has normal strength. She displays no tremor. No cranial nerve deficit or sensory deficit. She exhibits normal muscle tone. Coordination and gait normal. GCS eye subscore is 4. GCS verbal subscore is 5. GCS motor subscore is 6.  Having the patient lie down then arise to a seated position produces dizziness. Having her turn her head to the right and left while supine and then while sitting did not cause her to have symptoms. She states at home oftentimes she does have symptoms with these maneuvers.   Skin: Skin is warm and dry. No rash noted.  Psychiatric: She has a normal mood and affect. Her behavior is normal.  Nursing note and vitals reviewed.   Urgent Care Course     Procedures (including critical care time)  Labs Review Labs Reviewed - No data to display  Imaging Review No results found.   Visual Acuity Review  Right Eye Distance:   Left Eye Distance:   Bilateral Distance:    Right Eye Near:   Left Eye Near:    Bilateral Near:         MDM   1. Labyrinthitis, unspecified laterality   2. Dizziness   Patient notes that last week she had allergy symptoms with PND, nasal stuffiness, rhinorrhea, ears  popping and feeling stopped up. Most of the symptoms improved. Take the medication as directed and as needed. Move slowly. Do not get up from a lying position suddenly. If you develop severe headache, change in vision, weakness on one side of the body or persistent vomiting coated to the emergency department. Meds ordered this encounter  Medications  . phentermine (ADIPEX-P) 37.5 MG tablet    Sig: Take 37.5 mg by mouth daily before breakfast.  . meclizine (ANTIVERT) 25 MG tablet    Sig: Take 1 tablet (25 mg total) by mouth 3 (three) times daily as needed for dizziness.    Dispense:  20 tablet    Refill:  0    Order Specific Question:   Supervising Provider    Answer:   Eustace Moore [[id number]]  . ondansetron (ZOFRAN) 4 MG tablet    Sig: Take 1 tablet (4 mg total) by mouth every 6 (six) hours.    Dispense:  12 tablet  Refill:  0    Order Specific Question:   Supervising Provider    Answer:   Eustace Moore [[id number]]       Hayden Rasmussen, NP 07/31/16 [id number]

## 2016-07-31 NOTE — ED Triage Notes (Signed)
The patient presented to the [facility] with a complaint of a headache and dizziness x 1 week.

## 2016-07-31 NOTE — Discharge Instructions (Signed)
Take the medication as directed and as needed. Move slowly. Do not get up from a lying position suddenly. If you develop severe headache, change in vision, weakness on one side of the body or persistent vomiting coated to the emergency department.

## 2016-08-27 ENCOUNTER — Ambulatory Visit (INDEPENDENT_AMBULATORY_CARE_PROVIDER_SITE_OTHER): Payer: Self-pay | Admitting: Podiatry

## 2016-08-27 ENCOUNTER — Telehealth: Payer: Self-pay | Admitting: *Deleted

## 2016-08-27 DIAGNOSIS — B351 Tinea unguium: Secondary | ICD-10-CM

## 2016-08-27 DIAGNOSIS — M79609 Pain in unspecified limb: Secondary | ICD-10-CM

## 2016-08-27 MED ORDER — NONFORMULARY OR COMPOUNDED ITEM: 2 refills | Status: DC

## 2016-08-27 NOTE — Telephone Encounter (Signed)
Dr. Logan BoresEvans ordered [facility] Naftifine HCL Cream. [facility]

## 2016-08-27 NOTE — Progress Notes (Signed)
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 All safety precautions were in place. Patient requested anti-fungal cream to apply in between her toes as needed. [facility]. Follow up in 4 weeks for 4th treatment

## 2016-09-28 ENCOUNTER — Ambulatory Visit: Payer: Self-pay | Admitting: Podiatry

## 2016-09-28 DIAGNOSIS — B351 Tinea unguium: Secondary | ICD-10-CM

## 2016-09-30 NOTE — Progress Notes (Signed)
Pt presents with mycotic infection of nails 1-5 bilateral  All other systems are negative  Laser therapy administered to affected nails and tolerated well. All safety precautions were in place. Follow up in 4 weeks for 5th treatment 

## 2016-10-12 ENCOUNTER — Ambulatory Visit (INDEPENDENT_AMBULATORY_CARE_PROVIDER_SITE_OTHER): Payer: BLUE CROSS/BLUE SHIELD | Admitting: Podiatry

## 2016-10-12 ENCOUNTER — Encounter: Payer: Self-pay | Admitting: Podiatry

## 2016-10-12 DIAGNOSIS — B351 Tinea unguium: Secondary | ICD-10-CM | POA: Diagnosis not present

## 2016-10-12 MED ORDER — ITRACONAZOLE 100 MG PO CAPS: 100.0000 mg | ORAL_CAPSULE | Freq: Every day | ORAL | 0 refills | Status: DC

## 2016-10-14 NOTE — Progress Notes (Signed)
   Subjective: Patient presents today for follow-up treatment and evaluation of fungal nails. Patient states that the nails have been discolored and thickened for greater than 1 month. She is here to get the results of her nail culture. Patient presents today for further treatment and evaluation.  Objective: Physical Exam General: The patient is alert and oriented x3 in no acute distress.  Dermatology: Hyperkeratotic, discolored, thickened, onychodystrophy of nails noted.  Skin is warm, dry and supple bilateral lower extremities. Negative for open lesions or macerations.  Vascular: Palpable pedal pulses bilaterally. No edema or erythema noted. Capillary refill within normal limits.  Neurological: Epicritic and protective threshold grossly intact bilaterally.   Musculoskeletal Exam: Range of motion within normal limits to all pedal and ankle joints bilateral. Muscle strength 5/5 in all groups bilateral.   Assessment: #1 onychodystrophy bilateral toenails #2 possible onychomycosis #3 hyperkeratotic nails bilateral  Plan of Care:  #1 Patient was evaluated. #2 fungal culture results were reviewed today. Patient is positive for fungus. Today we discussed multiple treatment options including topical antifungal, oral antifungal medication, and laser fungal treatment.  #3 liver function test results were reviewed.  #4 prescription for Sporanox given to patient. #5 Continue topical antifungal. #6 Return to clinic in 6 months.  Felecia ShellingBrent M. Rosario Kushner, DPM [facility]  Dr. Felecia ShellingBrent M. Yekaterina Escutia, DPM    [address]                                        [place], [place] [address]                Office [phone]  Fax [phone]

## 2016-12-17 DIAGNOSIS — S0991XA Unspecified injury of ear, initial encounter: Secondary | ICD-10-CM | POA: Diagnosis not present

## 2017-01-07 DIAGNOSIS — H5203 Hypermetropia, bilateral: Secondary | ICD-10-CM | POA: Diagnosis not present

## 2017-01-07 DIAGNOSIS — H52223 Regular astigmatism, bilateral: Secondary | ICD-10-CM | POA: Diagnosis not present

## 2017-01-15 ENCOUNTER — Ambulatory Visit: Payer: BLUE CROSS/BLUE SHIELD

## 2017-01-22 ENCOUNTER — Ambulatory Visit: Payer: Self-pay

## 2017-02-25 DIAGNOSIS — F430 Acute stress reaction: Secondary | ICD-10-CM | POA: Diagnosis not present

## 2017-04-08 DIAGNOSIS — F430 Acute stress reaction: Secondary | ICD-10-CM | POA: Diagnosis not present

## 2017-04-14 ENCOUNTER — Ambulatory Visit: Payer: BLUE CROSS/BLUE SHIELD | Admitting: Podiatry

## 2017-04-21 ENCOUNTER — Ambulatory Visit: Payer: 59 | Admitting: Podiatry

## 2017-04-21 DIAGNOSIS — B351 Tinea unguium: Secondary | ICD-10-CM | POA: Diagnosis not present

## 2017-04-21 DIAGNOSIS — M79609 Pain in unspecified limb: Secondary | ICD-10-CM

## 2017-04-21 MED ORDER — ITRACONAZOLE 100 MG PO CAPS: 100.0000 mg | ORAL_CAPSULE | Freq: Every day | ORAL | 0 refills | Status: DC

## 2017-04-21 MED FILL — ITRACONAZOLE 100 MG CAPSULE: 100 | 90 days supply | Qty: 90 | Fill #0

## 2017-04-25 NOTE — Progress Notes (Signed)
   Subjective: Patient presents today for follow-up treatment and evaluation of fungal nails. She states that some of her nails are better, some worse and some unchanged. She could not afford Sporanox. She had been using the nail lacquer with some improvement but reports spilling it. Patient presents today for further treatment and evaluation.   No past medical history on file.   Objective: Physical Exam General: The patient is alert and oriented x3 in no acute distress.  Dermatology: Hyperkeratotic, discolored, thickened, onychodystrophy of nails noted.  Skin is warm, dry and supple bilateral lower extremities. Negative for open lesions or macerations.  Vascular: Palpable pedal pulses bilaterally. No edema or erythema noted. Capillary refill within normal limits.  Neurological: Epicritic and protective threshold grossly intact bilaterally.   Musculoskeletal Exam: Range of motion within normal limits to all pedal and ankle joints bilateral. Muscle strength 5/5 in all groups bilateral.   Assessment: #1 Onychomycosis bilateral toenails  Plan of Care:  #1 Patient was evaluated. #2 Renew prescription for Sporanox #90  #3 [facility]. #4 Return to clinic as needed.   Felecia ShellingBrent M. Derrick Tiegs, DPM [facility]  Dr. Felecia ShellingBrent M. Audry Pecina, DPM    [address]                                        [place], [place] [address]                Office [phone]  Fax [phone]

## 2017-04-26 DIAGNOSIS — F430 Acute stress reaction: Secondary | ICD-10-CM | POA: Diagnosis not present

## 2017-05-03 DIAGNOSIS — F430 Acute stress reaction: Secondary | ICD-10-CM | POA: Diagnosis not present

## 2017-06-17 DIAGNOSIS — F430 Acute stress reaction: Secondary | ICD-10-CM | POA: Diagnosis not present

## 2017-07-19 DIAGNOSIS — F430 Acute stress reaction: Secondary | ICD-10-CM | POA: Diagnosis not present

## 2017-08-11 DIAGNOSIS — N941 Unspecified dyspareunia: Secondary | ICD-10-CM | POA: Diagnosis not present

## 2017-08-11 DIAGNOSIS — E282 Polycystic ovarian syndrome: Secondary | ICD-10-CM | POA: Diagnosis not present

## 2017-08-11 DIAGNOSIS — R6882 Decreased libido: Secondary | ICD-10-CM | POA: Diagnosis not present

## 2017-08-11 DIAGNOSIS — E559 Vitamin D deficiency, unspecified: Secondary | ICD-10-CM | POA: Diagnosis not present

## 2017-08-11 DIAGNOSIS — R7989 Other specified abnormal findings of blood chemistry: Secondary | ICD-10-CM | POA: Diagnosis not present

## 2017-09-16 DIAGNOSIS — R7989 Other specified abnormal findings of blood chemistry: Secondary | ICD-10-CM | POA: Diagnosis not present

## 2017-09-16 DIAGNOSIS — E282 Polycystic ovarian syndrome: Secondary | ICD-10-CM | POA: Diagnosis not present

## 2017-09-16 DIAGNOSIS — R7309 Other abnormal glucose: Secondary | ICD-10-CM | POA: Diagnosis not present

## 2017-09-16 DIAGNOSIS — R6882 Decreased libido: Secondary | ICD-10-CM | POA: Diagnosis not present

## 2017-09-16 DIAGNOSIS — N941 Unspecified dyspareunia: Secondary | ICD-10-CM | POA: Diagnosis not present

## 2017-09-16 MED FILL — ARMOUR THYROID 15 MG TABLET: 15 | 30 days supply | Qty: 30 | Fill #0

## 2017-09-22 DIAGNOSIS — E041 Nontoxic single thyroid nodule: Secondary | ICD-10-CM | POA: Diagnosis not present

## 2017-09-22 DIAGNOSIS — Z0000 Encounter for general adult medical examination without abnormal findings: Secondary | ICD-10-CM | POA: Diagnosis not present

## 2017-09-22 DIAGNOSIS — Z6836 Body mass index (BMI) 36.0-36.9, adult: Secondary | ICD-10-CM | POA: Diagnosis not present

## 2017-09-22 DIAGNOSIS — Z1331 Encounter for screening for depression: Secondary | ICD-10-CM | POA: Diagnosis not present

## 2017-09-22 DIAGNOSIS — R7303 Prediabetes: Secondary | ICD-10-CM | POA: Diagnosis not present

## 2017-09-22 DIAGNOSIS — E669 Obesity, unspecified: Secondary | ICD-10-CM | POA: Diagnosis not present

## 2017-09-22 DIAGNOSIS — E079 Disorder of thyroid, unspecified: Secondary | ICD-10-CM | POA: Diagnosis not present

## 2017-09-22 DIAGNOSIS — Z1389 Encounter for screening for other disorder: Secondary | ICD-10-CM | POA: Diagnosis not present

## 2017-09-30 ENCOUNTER — Other Ambulatory Visit: Payer: Self-pay | Admitting: Internal Medicine

## 2017-09-30 DIAGNOSIS — E041 Nontoxic single thyroid nodule: Secondary | ICD-10-CM

## 2017-09-30 MED FILL — PROGESTERONE 100 MG CAPSULE: 100 | 7 days supply | Qty: 7 | Fill #0

## 2017-10-06 ENCOUNTER — Encounter: Payer: Self-pay | Admitting: Podiatry

## 2017-10-07 MED FILL — SAXENDA 18 MG/3 ML PEN: 18 | 30 days supply | Qty: 15 | Fill #0

## 2017-10-07 MED FILL — UNIFINE PENTIPS 6MM 31G: 31G X 6 MM | 30 days supply | Qty: 100 | Fill #0

## 2017-10-08 ENCOUNTER — Telehealth: Payer: Self-pay | Admitting: *Deleted

## 2017-10-08 ENCOUNTER — Encounter: Payer: Self-pay | Admitting: Podiatry

## 2017-10-08 ENCOUNTER — Ambulatory Visit: Payer: 59 | Admitting: Podiatry

## 2017-10-08 DIAGNOSIS — B351 Tinea unguium: Secondary | ICD-10-CM | POA: Diagnosis not present

## 2017-10-08 DIAGNOSIS — M79609 Pain in unspecified limb: Secondary | ICD-10-CM | POA: Diagnosis not present

## 2017-10-08 DIAGNOSIS — L600 Ingrowing nail: Secondary | ICD-10-CM | POA: Diagnosis not present

## 2017-10-08 NOTE — Patient Instructions (Signed)
Betadine Soak Instructions  Purchase an 8 oz. bottle of BETADINE solution (Povidone)  THE DAY AFTER THE PROCEDURE  Place 1 tablespoon of betadine solution in a quart of warm tap water.  Submerge your foot or feet with outer bandage intact for the initial soak; this will allow the bandage to become moist and wet for easy lift off.  Once you remove your bandage, continue to soak in the solution for 20 minutes.  This soak should be done twice a day.  Next, remove your foot or feet from solution, blot dry the affected area and cover.  You may use a band aid large enough to cover the area or use gauze and tape.  Apply other medications to the area as directed by the doctor such as cortisporin otic solution (ear drops) or neosporin.  IF YOUR SKIN BECOMES IRRITATED WHILE USING THESE INSTRUCTIONS, IT IS OKAY TO SWITCH TO EPSOM SALTS AND WATER OR WHITE VINEGAR AND WATER.ANTIBACTERIAL SOAP INSTRUCTIONS  THE DAY AFTER PROCEDURE  Please follow the instructions your doctor has marked.   Shower as usual. Before getting out, place a drop of antibacterial liquid soap (Dial) on a wet, clean washcloth.  Gently wipe washcloth over affected area.  Afterward, rinse the area with warm water.  Blot the area dry with a soft cloth and cover with antibiotic ointment (neosporin, polysporin, bacitracin) and band aid or gauze and tape  Place 3-4 drops of antibacterial liquid soap in a quart of warm tap water.  Submerge foot into water for 20 minutes.  If bandage was applied after your procedure, leave on to allow for easy lift off, then remove and continue with soak for the remaining time.  Next, blot area dry with a soft cloth and cover with a bandage.  Apply other medications as directed by your doctor, such as cortisporin otic solution (eardrops) or neosporin antibiotic ointment 

## 2017-10-08 NOTE — Progress Notes (Signed)
Subjective:   Patient ID: Cynthia Figueroa, female   DOB: 32 y.o.   MRN: [id number]   HPI Patient presents stating she has had damage to her right big toenail and it is loose and it at times is discomforting   ROS      Objective:  Physical Exam  Neurovascular status intact with damage right hallux nail with loose nail bed that is partially attached     Assessment:  Traumatized right hallux nail with partial detachment     Plan:  Discussed condition and at this time I recommended nail removal and allowing new nail to regrow explaining that may not regrow normally.  Patient wants procedure and understands it may not regrow normally and today I infiltrated the right hallux 60 mg like Marcaine mixture remove the nail removed all flesh and will allow a new nail to regrow explaining that it may not regrow in a normal fashion.  Reappoint to recheck and sterile dressing applied today

## 2017-10-08 NOTE — Telephone Encounter (Signed)
You left before I could give you your instructions.  I'm still here, I was in the restroom."  I'll bring you your instructions.

## 2017-10-11 DIAGNOSIS — F430 Acute stress reaction: Secondary | ICD-10-CM | POA: Diagnosis not present

## 2017-10-12 ENCOUNTER — Encounter: Payer: Self-pay | Admitting: Podiatry

## 2017-10-15 MED FILL — ARMOUR THYROID 15 MG TABLET: 15 | 30 days supply | Qty: 30 | Fill #1

## 2017-10-18 MED ORDER — IBUPROFEN 800 MG PO TABS: 800.0000 mg | ORAL_TABLET | Freq: Three times a day (TID) | ORAL | 1 refills | Status: DC | PRN

## 2017-10-18 MED FILL — IBUPROFEN 800 MG TAB: 800 | 30 days supply | Qty: 90 | Fill #0

## 2017-10-21 ENCOUNTER — Ambulatory Visit
Admission: RE | Admit: 2017-10-21 | Discharge: 2017-10-21 | Disposition: A | Discharge status: Home / Self Care | Payer: 59 | Source: Ambulatory Visit | Attending: Internal Medicine | Admitting: Internal Medicine

## 2017-10-21 DIAGNOSIS — E039 Hypothyroidism, unspecified: Secondary | ICD-10-CM | POA: Diagnosis not present

## 2017-10-21 DIAGNOSIS — E041 Nontoxic single thyroid nodule: Secondary | ICD-10-CM

## 2017-11-19 MED FILL — ARMOUR THYROID 15 MG TABLET: 15 | 30 days supply | Qty: 30 | Fill #2

## 2017-11-19 MED FILL — SAXENDA 18 MG/3 ML PEN: 18 | 30 days supply | Qty: 15 | Fill #1

## 2017-12-15 ENCOUNTER — Encounter (INDEPENDENT_AMBULATORY_CARE_PROVIDER_SITE_OTHER): Payer: 59

## 2017-12-23 ENCOUNTER — Ambulatory Visit (INDEPENDENT_AMBULATORY_CARE_PROVIDER_SITE_OTHER): Payer: 59 | Admitting: Family Medicine

## 2017-12-23 ENCOUNTER — Encounter (INDEPENDENT_AMBULATORY_CARE_PROVIDER_SITE_OTHER): Payer: Self-pay | Admitting: Family Medicine

## 2017-12-23 VITALS — BP 118/85 | HR 72 | Temp 98.3°F | Ht 61.0 in | Wt 183.0 lb

## 2017-12-23 DIAGNOSIS — Z9189 Other specified personal risk factors, not elsewhere classified: Secondary | ICD-10-CM | POA: Diagnosis not present

## 2017-12-23 DIAGNOSIS — R7303 Prediabetes: Secondary | ICD-10-CM | POA: Diagnosis not present

## 2017-12-23 DIAGNOSIS — Z6834 Body mass index (BMI) 34.0-34.9, adult: Secondary | ICD-10-CM | POA: Diagnosis not present

## 2017-12-23 DIAGNOSIS — R5383 Other fatigue: Secondary | ICD-10-CM | POA: Diagnosis not present

## 2017-12-23 DIAGNOSIS — R0602 Shortness of breath: Secondary | ICD-10-CM

## 2017-12-23 DIAGNOSIS — E669 Obesity, unspecified: Secondary | ICD-10-CM

## 2017-12-23 DIAGNOSIS — Z0289 Encounter for other administrative examinations: Secondary | ICD-10-CM

## 2017-12-23 DIAGNOSIS — Z1331 Encounter for screening for depression: Secondary | ICD-10-CM

## 2017-12-24 LAB — COMPREHENSIVE METABOLIC PANEL
A/G RATIO: 1.3 (ref 1.2–2.2)
ALK PHOS: 93 IU/L (ref 39–117)
ALT: 12 IU/L (ref 0–32)
AST: 14 IU/L (ref 0–40)
Albumin: 3.9 g/dL (ref 3.5–5.5)
BILIRUBIN TOTAL: 0.3 mg/dL (ref 0.0–1.2)
BUN / CREAT RATIO: 11 (ref 9–23)
BUN: 9 mg/dL (ref 6–20)
CO2: 23 mmol/L (ref 20–29)
Calcium: 9.2 mg/dL (ref 8.7–10.2)
Chloride: 103 mmol/L (ref 96–106)
Creatinine, Ser: 0.81 mg/dL (ref 0.57–1.00)
GFR calc Af Amer: 113 mL/min/{1.73_m2} (ref >59)
GFR calc non Af Amer: 98 mL/min/{1.73_m2} (ref >59)
Globulin, Total: 3.1 g/dL (ref 1.5–4.5)
Glucose: 72 mg/dL (ref 65–99)
POTASSIUM: 4.2 mmol/L (ref 3.5–5.2)
SODIUM: 141 mmol/L (ref 134–144)
Total Protein: 7 g/dL (ref 6.0–8.5)

## 2017-12-24 LAB — CBC WITH DIFFERENTIAL
Basophils Absolute: 0 10*3/uL (ref 0.0–0.2)
Basos: 0 %
EOS (ABSOLUTE): 0.1 10*3/uL (ref 0.0–0.4)
Eos: 1 %
Hematocrit: 40.5 % (ref 34.0–46.6)
Hemoglobin: 12.8 g/dL (ref 11.1–15.9)
Immature Grans (Abs): 0 10*3/uL (ref 0.0–0.1)
Immature Granulocytes: 0 %
Lymphocytes Absolute: 2.6 10*3/uL (ref 0.7–3.1)
Lymphs: 27 %
MCH: 27.4 pg (ref 26.6–33.0)
MCHC: 31.6 g/dL (ref 31.5–35.7)
MCV: 87 fL (ref 79–97)
MONOCYTES: 5 %
MONOS ABS: 0.5 10*3/uL (ref 0.1–0.9)
NEUTROS ABS: 6.5 10*3/uL (ref 1.4–7.0)
Neutrophils: 67 %
RBC: 4.68 x10E6/uL (ref 3.77–5.28)
RDW: 12.5 % (ref 12.3–15.4)
WBC: 9.7 10*3/uL (ref 3.4–10.8)

## 2017-12-24 LAB — HEMOGLOBIN A1C
Est. average glucose Bld gHb Est-mCnc: 114 mg/dL
Hgb A1c MFr Bld: 5.6 % (ref 4.8–5.6)

## 2017-12-24 LAB — LIPID PANEL WITH LDL/HDL RATIO
Cholesterol, Total: 165 mg/dL (ref 100–199)
HDL: 40 mg/dL (ref >39)
LDL CALC: 114 mg/dL — AB (ref 0–99)
LDL/HDL RATIO: 2.9 ratio (ref 0.0–3.2)
Triglycerides: 54 mg/dL (ref 0–149)
VLDL CHOLESTEROL CAL: 11 mg/dL (ref 5–40)

## 2017-12-24 LAB — T3: T3 TOTAL: 112 ng/dL (ref 71–180)

## 2017-12-24 LAB — T4, FREE: FREE T4: 1.1 ng/dL (ref 0.82–1.77)

## 2017-12-24 LAB — VITAMIN D 25 HYDROXY (VIT D DEFICIENCY, FRACTURES): Vit D, 25-Hydroxy: 46.1 ng/mL (ref 30.0–100.0)

## 2017-12-24 LAB — VITAMIN B12: VITAMIN B 12: 1262 pg/mL — AB (ref 232–1245)

## 2017-12-24 LAB — TSH: TSH: 1.29 u[IU]/mL (ref 0.450–4.500)

## 2017-12-24 LAB — INSULIN, RANDOM: INSULIN: 13.4 u[IU]/mL (ref 2.6–24.9)

## 2017-12-24 LAB — FOLATE: FOLATE: 12.4 ng/mL (ref >3.0)

## 2017-12-27 DIAGNOSIS — F430 Acute stress reaction: Secondary | ICD-10-CM | POA: Diagnosis not present

## 2017-12-28 NOTE — Progress Notes (Signed)
Office: [phone]  /  Fax: [phone]   Dear Cynthia Kief, PA-C,   Thank you for referring Cynthia Figueroa to our clinic. The following note includes my evaluation and treatment recommendations.  HPI:   Chief Complaint: OBESITY    Cynthia Figueroa has been referred by Cynthia Kief, PA-C for consultation regarding her obesity and obesity related comorbidities.    Cynthia Figueroa (MR# [id number]) is a 32 y.o. female who presents on 12/23/2017 for obesity evaluation and treatment. Current BMI is Body mass index is 34.58 kg/m.Marland Kitchen Cynthia Figueroa has been struggling with her weight for many years and has been unsuccessful in either losing weight, maintaining weight loss, or reaching her healthy weight goal.     Cynthia Figueroa attended our information session and states she is currently in the action stage of change and ready to dedicate time achieving and maintaining a healthier weight. Cynthia Figueroa is interested in becoming our Cynthia Figueroa and working on intensive lifestyle modifications including (but not limited to) diet, exercise and weight loss.    Cynthia Figueroa states her family eats meals together she thinks her family will eat healthier with  her her desired weight loss is 38 lbs she started gaining weight when she got married 5 years ago her heaviest weight ever was 197 lbs she is a picky eater and doesn't like to eat healthier foods  she has significant food cravings issues  she snacks frequently in the evenings she skips meals frequently she is frequently drinking liquids with calories she frequently makes poor food choices she frequently eats larger portions than normal  she struggles with emotional eating    Fatigue Cynthia Figueroa feels her energy is lower than it should be. This has worsened with weight gain and has not worsened recently. Cynthia Figueroa admits to daytime somnolence and  admits to waking up still tired. Cynthia Figueroa is at risk for obstructive sleep apnea. Patent has a history of symptoms of daytime  fatigue. Cynthia Figueroa generally gets 7 hours of sleep per night, and states they generally have nightime awakenings. Snoring is present. Apneic episodes are not present. Epworth Sleepiness Score is 10.  Dyspnea on exertion Cynthia Figueroa notes increasing shortness of breath with exercising and seems to be worsening over time with weight gain. She notes getting out of breath sooner with activity than she used to. This has not gotten worse recently. EKG-Low voltage. Cambreigh denies orthopnea.  Pre-Diabetes Cynthia Figueroa has a diagnosis of pre-diabetes based on her elevated Hgb A1c and was informed this puts her at greater risk of developing diabetes. She is not taking metformin currently and continues to work on diet and exercise to decrease risk of diabetes. She denies nausea or hypoglycemia.  At risk for diabetes Cynthia Figueroa is at higher than average risk for developing diabetes due to her obesity and pre-diabetes. She currently denies polyuria or polydipsia.  Depression Screen Cynthia Figueroa's Food and Mood (modified PHQ-9) score was  Depression screen PHQ 2/9 12/23/2017  Decreased Interest 1  Down, Depressed, Hopeless 3  PHQ - 2 Score 4  Altered sleeping 2  Tired, decreased energy 3  Change in appetite 2  Feeling bad or failure about yourself  1  Trouble concentrating 3  Moving slowly or fidgety/restless 0  Suicidal thoughts 0  PHQ-9 Score 15  Difficult doing work/chores Somewhat difficult    ALLERGIES: Allergies  Allergen Reactions  . Lamisil [Terbinafine] Hives    MEDICATIONS: Current Outpatient Medications on File Prior to Visit  Medication Sig Dispense Refill  . ASHWAGANDHA PO Take 300 mg by  mouth daily.    . Cholecalciferol (VITAMIN D3) 2000 units CHEW Chew by mouth.    . Cranberry 500 MG CAPS Take by mouth.    . itraconazole (SPORANOX) 100 MG capsule Take 1 capsule (100 mg total) by mouth daily. 90 capsule 0  . Liraglutide -Weight Management (SAXENDA) 18 MG/3ML SOPN Inject 3 mg into the skin  daily.    . Multiple Vitamins-Minerals (MULTIVITAMIN PO) Take by mouth.    . NON FORMULARY Lemon balm herb  330 mg /mL qd prn    . Probiotic Product (PROBIOTIC DAILY PO) Take by mouth.    . progesterone (PROMETRIUM) 100 MG capsule Take 100 mg by mouth daily.    . Turmeric 500 MG CAPS Take by mouth.    Marland Kitchen VITAMIN K PO Take 150 mcg by mouth daily.    . IRON PO Take 1 tablet by mouth daily.    Marland Kitchen MAGNESIUM PO Take 1 tablet by mouth daily.     No current facility-administered medications on file prior to visit.     PAST MEDICAL HISTORY: Past Medical History:  Diagnosis Date  . Anxiety   . GERD (gastroesophageal reflux disease)   . Infertility, female   . PCOS (polycystic ovarian syndrome)   . Pre-diabetes   . Vaginismus   . Vitamin D deficiency     PAST SURGICAL HISTORY: History reviewed. No pertinent surgical history.  SOCIAL HISTORY: Social History   Tobacco Use  . Smoking status: Never Smoker  . Smokeless tobacco: Never Used  Substance Use Topics  . Alcohol use: No  . Drug use: No    FAMILY HISTORY: Family History  Problem Relation Age of Onset  . High blood pressure Mother   . Thyroid disease Mother   . Obesity Mother   . AAA (abdominal aortic aneurysm) Father     ROS: Review of Systems  Constitutional: Positive for malaise/fatigue. Negative for weight loss.  HENT:       + Dry mouth  Eyes:       + Wear glasses or contacts  Respiratory: Positive for shortness of breath (with exertion).   Cardiovascular: Negative for orthopnea.  Gastrointestinal: Positive for heartburn. Negative for nausea.  Genitourinary: Negative for frequency.  Musculoskeletal:       + Muscle or joint pain  Endo/Heme/Allergies: Negative for polydipsia.       Negative hypoglycemia  Psychiatric/Behavioral: Positive for depression. Negative for suicidal ideas.       + Stress    PHYSICAL EXAM: Blood pressure 118/85, pulse 72, temperature 98.3 F (36.8 C), temperature source Oral,  height 5\' 1"  (1.549 m), weight 183 lb (83 kg), last menstrual period 11/09/2017, SpO2 100 %. Body mass index is 34.58 kg/m. Physical Exam  Constitutional: She is oriented to person, place, and time. She appears well-developed and well-nourished.  HENT:  Head: Normocephalic and atraumatic.  Nose: Nose normal.  Eyes: EOM are normal. No scleral icterus.  Neck: Normal range of motion. Neck supple. No thyromegaly present.  Cardiovascular: Normal rate and regular rhythm.  Pulmonary/Chest: Effort normal. No respiratory distress.  Abdominal: Soft. There is no tenderness.  + Obesity  Musculoskeletal:  Range of Motion normal in all 4 extremities Trace edema noted in bilateral lower extremities  Neurological: She is alert and oriented to person, place, and time. Coordination normal.  Skin: Skin is warm and dry.  Psychiatric: She has a normal mood and affect. Her behavior is normal.  Vitals reviewed.   RECENT LABS AND TESTS: BMET  Component Value Date/Time   NA 141 12/23/2017 1249   K 4.2 12/23/2017 1249   CL 103 12/23/2017 1249   CO2 23 12/23/2017 1249   GLUCOSE 72 12/23/2017 1249   GLUCOSE 110 (H) 08/04/2013 2248   BUN 9 12/23/2017 1249   CREATININE 0.81 12/23/2017 1249   CALCIUM 9.2 12/23/2017 1249   GFRNONAA 98 12/23/2017 1249   GFRAA 113 12/23/2017 1249   Lab Results  Component Value Date   HGBA1C 5.6 12/23/2017   Lab Results  Component Value Date   INSULIN 13.4 12/23/2017   CBC    Component Value Date/Time   WBC 9.7 12/23/2017 1249   WBC 14.2 (H) 08/04/2013 2248   RBC 4.68 12/23/2017 1249   RBC 4.62 08/04/2013 2248   HGB 12.8 12/23/2017 1249   HCT 40.5 12/23/2017 1249   PLT 427 (H) 08/04/2013 2248   MCV 87 12/23/2017 1249   MCH 27.4 12/23/2017 1249   MCH 29.2 08/04/2013 2248   MCHC 31.6 12/23/2017 1249   MCHC 33.7 08/04/2013 2248   RDW 12.5 12/23/2017 1249   LYMPHSABS 2.6 12/23/2017 1249   MONOABS 0.8 08/04/2013 2239   EOSABS 0.1 12/23/2017 1249    BASOSABS 0.0 12/23/2017 1249   Iron/TIBC/Ferritin/ %Sat No results found for: IRON, TIBC, FERRITIN, IRONPCTSAT Lipid Panel     Component Value Date/Time   CHOL 165 12/23/2017 1249   TRIG 54 12/23/2017 1249   HDL 40 12/23/2017 1249   LDLCALC 114 (H) 12/23/2017 1249   Hepatic Function Panel     Component Value Date/Time   PROT 7.0 12/23/2017 1249   ALBUMIN 3.9 12/23/2017 1249   AST 14 12/23/2017 1249   ALT 12 12/23/2017 1249   ALKPHOS 93 12/23/2017 1249   BILITOT 0.3 12/23/2017 1249   BILIDIR <0.10 06/12/2016 1309      Component Value Date/Time   TSH 1.290 12/23/2017 1249    ECG  shows NSR with a rate of 77 BPM INDIRECT CALORIMETER done today shows a VO2 of 214 and a REE of 1490.  Her calculated basal metabolic rate is 1610 thus her basal metabolic rate is worse than expected.    ASSESSMENT AND PLAN: Other fatigue - Plan: EKG 12-Lead, Vitamin B12, CBC With Differential, Folate, Lipid Panel With LDL/HDL Ratio, T3, T4, free, TSH, VITAMIN D 25 Hydroxy (Vit-D Deficiency, Fractures)  Shortness of breath on exertion - Plan: CBC With Differential  Prediabetes - Plan: Comprehensive metabolic panel, Hemoglobin A1c, Insulin, random  Depression screening  At risk for diabetes mellitus  Class 1 obesity with serious comorbidity and body mass index (BMI) of 34.0 to 34.9 in adult, unspecified obesity type  PLAN:  Fatigue Maryjo was informed that her fatigue may be related to obesity, depression or many other causes. Labs will be ordered, and in the meanwhile Cynthia Figueroa has agreed to work on diet, exercise and weight loss to help with fatigue. Proper sleep hygiene was discussed including the need for 7-8 hours of quality sleep each night. A sleep study was not ordered based on symptoms and Epworth score.  Dyspnea on exertion Cynthia Figueroa's shortness of breath appears to be obesity related and exercise induced. She has agreed to work on weight loss and gradually increase exercise to treat  her exercise induced shortness of breath. If Cynthia Figueroa follows our instructions and loses weight without improvement of her shortness of breath, we will plan to refer to pulmonology. We will monitor this condition regularly. Cynthia Figueroa agrees to this plan.  Pre-Diabetes Cynthia Figueroa will continue  to work on weight loss, exercise, and decreasing simple carbohydrates in her diet to help decrease the risk of diabetes. We dicussed metformin including benefits and risks. She was informed that eating too many simple carbohydrates or too many calories at one sitting increases the likelihood of GI side effects. Cynthia Figueroa declined metformin for now and a prescription was not written today. We will check labs and Cynthia Figueroa agrees to follow up with our clinic in 2 weeks as directed to monitor her progress.  Diabetes risk counselling Cynthia Figueroa was given extended (15 minutes) diabetes prevention counseling today. She is 32 y.o. female and has risk factors for diabetes including obesity and pre-diabetes. We discussed intensive lifestyle modifications today with an emphasis on weight loss as well as increasing exercise and decreasing simple carbohydrates in her diet.  Depression Screen Cynthia Figueroa had a strongly positive depression screening. Depression is commonly associated with obesity and often results in emotional eating behaviors. We will monitor this closely and work on CBT to help improve the non-hunger eating patterns. Referral to Psychology may be required if no improvement is seen as she continues in our clinic.  Obesity Cynthia Figueroa is currently in the action stage of change and her goal is to continue with weight loss efforts. I recommend Cynthia Figueroa begin the structured treatment plan as follows:  She has agreed to follow the Category 2 plan Cynthia Figueroa has been instructed to eventually work up to a goal of 150 minutes of combined cardio and strengthening exercise per week for weight loss and overall health benefits. We discussed the  following Behavioral Modification Strategies today: increasing lean protein intake, increasing vegetables, decrease eating out, and planning for success   She was informed of the importance of frequent follow up visits to maximize her success with intensive lifestyle modifications for her multiple health conditions. She was informed we would discuss her lab results at her next visit unless there is a critical issue that needs to be addressed sooner. Cynthia Figueroa agreed to keep her next visit at the agreed upon time to discuss these results.    OBESITY BEHAVIORAL INTERVENTION VISIT  Today's visit was # 1   Starting weight: 183 lbs Starting date: 12/23/17 Today's weight : 183 lbs  Today's date: 12/23/2017 Total lbs lost to date: 0    ASK: We discussed the diagnosis of obesity with Cynthia Figueroa Mier today and Cynthia Figueroa agreed to give us permission to discuss obesity behavioral modification therapy today.  ASSESS: Cynthia Figueroa has the diagnosis of obesity and her BMI today is 34.6 Cynthia Figueroa is in the action stage of change   ADVISE: Cynthia Figueroa was educated on the multiple health risks of obesity as well as the benefit of weight loss to improve her health. She was advised of the need for long term treatment and the importance of lifestyle modifications to improve her current health and to decrease her risk of future health problems.  AGREE: Multiple dietary modification options and treatment options were discussed and  Cynthia Figueroa agreed to follow the recommendations documented in the above note.  ARRANGE: Cynthia Figueroa was educated on the importance of frequent visits to treat obesity as outlined per CMS and USPSTF guidelines and agreed to schedule her next follow up appointment today.  I, Burt KnackSharon Martin, am acting as transcriptionist for Debbra RidingAlexandria Kadolph, MD   I have reviewed the above documentation for accuracy and completeness, and I agree with the above. - Debbra RidingAlexandria Kadolph, MD

## 2017-12-29 MED FILL — PROGESTERONE 100 MG CAPSULE: 100 | 28 days supply | Qty: 12 | Fill #0

## 2018-01-06 ENCOUNTER — Ambulatory Visit (INDEPENDENT_AMBULATORY_CARE_PROVIDER_SITE_OTHER): Payer: 59 | Admitting: Family Medicine

## 2018-01-06 VITALS — BP 117/87 | HR 82 | Ht 61.0 in | Wt 183.0 lb

## 2018-01-06 DIAGNOSIS — E559 Vitamin D deficiency, unspecified: Secondary | ICD-10-CM | POA: Diagnosis not present

## 2018-01-06 DIAGNOSIS — Z6834 Body mass index (BMI) 34.0-34.9, adult: Secondary | ICD-10-CM | POA: Diagnosis not present

## 2018-01-06 DIAGNOSIS — E8881 Metabolic syndrome: Secondary | ICD-10-CM

## 2018-01-06 DIAGNOSIS — F430 Acute stress reaction: Secondary | ICD-10-CM | POA: Diagnosis not present

## 2018-01-06 DIAGNOSIS — E669 Obesity, unspecified: Secondary | ICD-10-CM

## 2018-01-06 DIAGNOSIS — Z9189 Other specified personal risk factors, not elsewhere classified: Secondary | ICD-10-CM | POA: Diagnosis not present

## 2018-01-06 DIAGNOSIS — E7849 Other hyperlipidemia: Secondary | ICD-10-CM

## 2018-01-06 MED ORDER — LIRAGLUTIDE -WEIGHT MANAGEMENT 18 MG/3ML ~~LOC~~ SOPN: 3.0000 mg | PEN_INJECTOR | Freq: Every day | SUBCUTANEOUS | 0 refills | Status: DC

## 2018-01-06 MED FILL — SAXENDA 18 MG/3 ML PEN: 18 | 30 days supply | Qty: 15 | Fill #0

## 2018-01-10 NOTE — Progress Notes (Signed)
Office: [phone]  /  Fax: [phone]   HPI:   Chief Complaint: OBESITY Cynthia Figueroa is here to discuss her progress with her obesity treatment plan. She is on the  follow the Category 2 plan and is following her eating plan approximately 66 % of the time. She states she is exercising 0 minutes 0 times per week. [name] found out she does struggle with emotional eating and couldn't follow dinner secondary to sister making carb meals. She felt satisfied and energized from breakfast. Her weight is 183 lb (83 kg) today and has maintained since her last visit. She has lost 0 lbs since starting treatment with [place].  Hyperlipidemia Cynthia Figueroa has hyperlipidemia and has been trying to improve her cholesterol levels with intensive lifestyle modification including a low saturated fat diet, exercise and weight loss. She denies any chest pain, claudication or myalgias. She is not currently on a statin. Her last LDL was 114.   Vitamin D deficiency Cynthia Figueroa has a diagnosis of vitamin D deficiency. She is not currently taking vit D and denies nausea, vomiting or muscle weakness. She is currently on 7500iu/day (gets form her holistic doctor) and is positive for fatigue.   Insulin Resistance Cynthia Figueroa has a diagnosis of insulin resistance based on her elevated fasting insulin level >5. Although Cynthia Figueroa's blood glucose readings are still under good control, insulin resistance puts her at greater risk of metabolic syndrome and diabetes. She is not taking metformin currently and continues to work on diet and exercise to decrease risk of diabetes. Currently on Saxenda 2.4 mg , only occasional nausea and lost 15 lbs on Saxenda initially   At risk for diabetes Cynthia Figueroa is at higher than averagerisk for developing diabetes due to her obesity. She currently denies polyuria or polydipsia.   ALLERGIES: Allergies  Allergen Reactions  . Lamisil [Terbinafine] Hives    MEDICATIONS: Current Outpatient Medications on File  Prior to Visit  Medication Sig Dispense Refill  . ASHWAGANDHA PO Take 300 mg by mouth daily.    . Cholecalciferol (VITAMIN D3) 2000 units CHEW Chew by mouth.    . Cranberry 500 MG CAPS Take by mouth.    . IRON PO Take 1 tablet by mouth daily.    Marland Kitchen itraconazole (SPORANOX) 100 MG capsule Take 1 capsule (100 mg total) by mouth daily. 90 capsule 0  . MAGNESIUM PO Take 1 tablet by mouth daily.    . Multiple Vitamins-Minerals (MULTIVITAMIN PO) Take by mouth.    . NON FORMULARY Lemon balm herb  330 mg /mL qd prn    . Probiotic Product (PROBIOTIC DAILY PO) Take by mouth.    . progesterone (PROMETRIUM) 100 MG capsule Take 100 mg by mouth daily.    . Turmeric 500 MG CAPS Take by mouth.    Marland Kitchen VITAMIN K PO Take 150 mcg by mouth daily.     No current facility-administered medications on file prior to visit.     PAST MEDICAL HISTORY: Past Medical History:  Diagnosis Date  . Anxiety   . GERD (gastroesophageal reflux disease)   . Infertility, female   . PCOS (polycystic ovarian syndrome)   . Pre-diabetes   . Vaginismus   . Vitamin D deficiency     PAST SURGICAL HISTORY: No past surgical history on file.  SOCIAL HISTORY: Social History   Tobacco Use  . Smoking status: Never Smoker  . Smokeless tobacco: Never Used  Substance Use Topics  . Alcohol use: No  . Drug use: No  FAMILY HISTORY: Family History  Problem Relation Age of Onset  . High blood pressure Mother   . Thyroid disease Mother   . Obesity Mother   . AAA (abdominal aortic aneurysm) Father     ROS: Review of Systems  Constitutional: Positive for malaise/fatigue.  All other systems reviewed and are negative.   PHYSICAL EXAM: Blood pressure 117/87, pulse 82, height 5\' 1"  (1.549 m), weight 183 lb (83 kg), last menstrual period 12/31/2017, SpO2 99 %. Body mass index is 34.58 kg/m. Physical Exam  Constitutional: She appears well-developed and well-nourished.  HENT:  Head: Normocephalic.  Eyes: EOM are normal.    Neck: Normal range of motion.  Pulmonary/Chest: Effort normal.  Musculoskeletal: Normal range of motion.  Skin: Skin is warm and dry.  Psychiatric: She has a normal mood and affect. Her behavior is normal.  Vitals reviewed.   RECENT LABS AND TESTS: BMET    Component Value Date/Time   NA 141 12/23/2017 1249   K 4.2 12/23/2017 1249   CL 103 12/23/2017 1249   CO2 23 12/23/2017 1249   GLUCOSE 72 12/23/2017 1249   GLUCOSE 110 (H) 08/04/2013 2248   BUN 9 12/23/2017 1249   CREATININE 0.81 12/23/2017 1249   CALCIUM 9.2 12/23/2017 1249   GFRNONAA 98 12/23/2017 1249   GFRAA 113 12/23/2017 1249   Lab Results  Component Value Date   HGBA1C 5.6 12/23/2017   Lab Results  Component Value Date   INSULIN 13.4 12/23/2017   CBC    Component Value Date/Time   WBC 9.7 12/23/2017 1249   WBC 14.2 (H) 08/04/2013 2248   RBC 4.68 12/23/2017 1249   RBC 4.62 08/04/2013 2248   HGB 12.8 12/23/2017 1249   HCT 40.5 12/23/2017 1249   PLT 427 (H) 08/04/2013 2248   MCV 87 12/23/2017 1249   MCH 27.4 12/23/2017 1249   MCH 29.2 08/04/2013 2248   MCHC 31.6 12/23/2017 1249   MCHC 33.7 08/04/2013 2248   RDW 12.5 12/23/2017 1249   LYMPHSABS 2.6 12/23/2017 1249   MONOABS 0.8 08/04/2013 2239   EOSABS 0.1 12/23/2017 1249   BASOSABS 0.0 12/23/2017 1249   Iron/TIBC/Ferritin/ %Sat No results found for: IRON, TIBC, FERRITIN, IRONPCTSAT Lipid Panel     Component Value Date/Time   CHOL 165 12/23/2017 1249   TRIG 54 12/23/2017 1249   HDL 40 12/23/2017 1249   LDLCALC 114 (H) 12/23/2017 1249   Hepatic Function Panel     Component Value Date/Time   PROT 7.0 12/23/2017 1249   ALBUMIN 3.9 12/23/2017 1249   AST 14 12/23/2017 1249   ALT 12 12/23/2017 1249   ALKPHOS 93 12/23/2017 1249   BILITOT 0.3 12/23/2017 1249   BILIDIR <0.10 06/12/2016 1309      Component Value Date/Time   TSH 1.290 12/23/2017 1249    ASSESSMENT AND PLAN: Other hyperlipidemia  Vitamin D deficiency  Insulin  resistance  At risk for diabetes mellitus  Class 1 obesity with serious comorbidity and body mass index (BMI) of 34.0 to 34.9 in adult, unspecified obesity type - Plan: Liraglutide -Weight Management (SAXENDA) 18 MG/3ML SOPN  PLAN: Hyperlipidemia Cynthia Figueroa was informed of the American Heart Association Guidelines emphasizing intensive lifestyle modifications as the first line treatment for hyperlipidemia. We discussed many lifestyle modifications today in depth, and Mimie will continue to work on decreasing saturated fats such as fatty red meat, butter and many fried foods. She will also increase vegetables and lean protein in her diet and continue to work on  exercise and weight loss efforts. Will follow up in 3 months.   Vitamin D Deficiency Cynthia Figueroa was informed that low vitamin D levels contributes to fatigue and are associated with obesity, breast, and colon cancer. She agrees to continue to take prescription Vit D @50 ,000 IU every week and will follow up for routine testing of vitamin D, at least 2-3 times per year. She was informed of the risk of over-replacement of vitamin D and agrees to not increase her dose unless she discusses this with [place] first. Continue Vit D supplement at this time.   Insulin Resistance Cynthia Figueroa will continue to work on weight loss, exercise, and decreasing simple carbohydrates in her diet to help decrease the risk of diabetes. We dicussed metformin including benefits and risks. She was informed that eating too many simple carbohydrates or too many calories at one sitting increases the likelihood of GI side effects. Cynthia Figueroa declined metformin for now and prescription was not written today. Cynthia Figueroa agreed to follow up with [place] as directed to monitor her progress. Will continue Saxenda 2.4 mg daily at this time and follow up.   Diabetes risk counselling Cynthia Figueroa was given extended (15 minutes) diabetes prevention counseling today. She is 32 y.o. female and has risk factors  for diabetes including obesity. We discussed intensive lifestyle modifications today with an emphasis on weight loss as well as increasing exercise and decreasing simple carbohydrates in her diet.  Obesity Cynthia Figueroa is currently in the action stage of change. As such, her goal is to continue with weight loss efforts She has agreed to follow the Category 3 plan Cynthia Figueroa has been instructed to work up to a goal of 150 minutes of combined cardio and strengthening exercise per week for weight loss and overall health benefits. We discussed the following Behavioral Modification Stratagies today: increasing lean protein intake, increasing vegetables and dealing with family or coworker sabotage also making better snacking choices while planning for success.    Cynthia Figueroa has agreed to follow up with our clinic in 2 weeks. She was informed of the importance of frequent follow up visits to maximize her success with intensive lifestyle modifications for her multiple health conditions.   OBESITY BEHAVIORAL INTERVENTION VISIT  Today's visit was # 2   Starting weight: 183 lbs  Starting date: 12/23/17 Today's weight : Weight: 183 lb (83 kg)  Today's date: 01/06/18 Total lbs lost to date: 0   ASK: We discussed the diagnosis of obesity with Cynthia Figueroa today and Cynthia Figueroa agreed to give [place] permission to discuss obesity behavioral modification therapy today.  ASSESS: Cynthia Figueroa has the diagnosis of obesity and her BMI today is @TBMI @ Cynthia Figueroa is in the action stage of change   ADVISE: Desaray was educated on the multiple health risks of obesity as well as the benefit of weight loss to improve her health. She was advised of the need for long term treatment and the importance of lifestyle modifications to improve her current health and to decrease her risk of future health problems.  AGREE: Multiple dietary modification options and treatment options were discussed and  Dyane agreed to follow the recommendations  documented in the above note.  ARRANGE: Harneet was educated on the importance of frequent visits to treat obesity as outlined per CMS and USPSTF guidelines and agreed to schedule her next follow up appointment today.  I, April Moore, am acting as Energy manager for Dr Debbra Riding.  I have reviewed the above documentation for accuracy and completeness, and I agree with the above. -  Ilene Qua, MD

## 2018-01-13 DIAGNOSIS — F430 Acute stress reaction: Secondary | ICD-10-CM | POA: Diagnosis not present

## 2018-01-21 DIAGNOSIS — F430 Acute stress reaction: Secondary | ICD-10-CM | POA: Diagnosis not present

## 2018-01-27 ENCOUNTER — Ambulatory Visit (INDEPENDENT_AMBULATORY_CARE_PROVIDER_SITE_OTHER): Payer: 59 | Admitting: Family Medicine

## 2018-01-27 VITALS — BP 117/76 | HR 89 | Temp 98.3°F | Ht 61.0 in | Wt 182.0 lb

## 2018-01-27 DIAGNOSIS — Z9189 Other specified personal risk factors, not elsewhere classified: Secondary | ICD-10-CM | POA: Diagnosis not present

## 2018-01-27 DIAGNOSIS — E559 Vitamin D deficiency, unspecified: Secondary | ICD-10-CM | POA: Diagnosis not present

## 2018-01-27 DIAGNOSIS — Z6834 Body mass index (BMI) 34.0-34.9, adult: Secondary | ICD-10-CM | POA: Diagnosis not present

## 2018-01-27 DIAGNOSIS — E8881 Metabolic syndrome: Secondary | ICD-10-CM

## 2018-01-27 DIAGNOSIS — E669 Obesity, unspecified: Secondary | ICD-10-CM

## 2018-01-27 DIAGNOSIS — K5909 Other constipation: Secondary | ICD-10-CM

## 2018-01-27 MED ORDER — METFORMIN HCL 500 MG PO TABS: 500.0000 mg | ORAL_TABLET | Freq: Every day | ORAL | 0 refills | Status: DC

## 2018-01-27 MED FILL — metFORMIN HCL 500 MG TABS: 500 | 30 days supply | Qty: 30 | Fill #0

## 2018-01-31 NOTE — Progress Notes (Signed)
Office: [phone]  /  Fax: [phone]   HPI:   Chief Complaint: OBESITY Cynthia Figueroa is here to discuss her progress with her obesity treatment plan. She is on the Category 2 plan and is following her eating plan approximately 90 % of the time. She states she is exercising 0 minutes 0 times per week. Cynthia Figueroa found that the morning after eating a full dinner she couldn't eat all eggs.  Her weight is 182 lb (82.6 kg) today and has had a weight loss of 1 pound over a period of 3 weeks since her last visit. She has lost 1 lb since starting treatment with [place].  Insulin Resistance Cynthia Figueroa has a diagnosis of insulin resistance based on her elevated fasting insulin level >5. Although Cynthia Figueroa's blood glucose readings are still under good control, insulin resistance puts her at greater risk of metabolic syndrome and diabetes. She notes carbohydrate cravings and previously on Saxenda. She continues to work on diet and exercise to decrease risk of diabetes.  At risk for diabetes Cynthia Figueroa is at higher than average risk for developing diabetes due to her obesity and insulin resistance. She currently denies polyuria or polydipsia.  Vitamin D Deficiency Cynthia Figueroa has a diagnosis of vitamin D deficiency. She is currently taking OTC Vit D. She notes fatigue and denies nausea, vomiting or muscle weakness.  Constipation Cynthia Figueroa notes constipation for the last few weeks, worse since attempting weight loss. She is note taking any fiber and states BM are less frequent and are hard and painful. She denies hematochezia or melena. She denies drinking less H20 recently.  ALLERGIES: Allergies  Allergen Reactions  . Lamisil [Terbinafine] Hives    MEDICATIONS: Current Outpatient Medications on File Prior to Visit  Medication Sig Dispense Refill  . ASHWAGANDHA PO Take 300 mg by mouth daily.    . Cholecalciferol (VITAMIN D3) 2000 units CHEW Chew by mouth.    . Cranberry 500 MG CAPS Take by mouth.    . IRON PO Take  1 tablet by mouth daily.    Marland Kitchen itraconazole (SPORANOX) 100 MG capsule Take 1 capsule (100 mg total) by mouth daily. 90 capsule 0  . Liraglutide -Weight Management (SAXENDA) 18 MG/3ML SOPN Inject 3 mg into the skin daily. 5 pen 0  . MAGNESIUM PO Take 1 tablet by mouth daily.    . Multiple Vitamins-Minerals (MULTIVITAMIN PO) Take by mouth.    . NON FORMULARY Lemon balm herb  330 mg /mL qd prn    . Probiotic Product (PROBIOTIC DAILY PO) Take by mouth.    . progesterone (PROMETRIUM) 100 MG capsule Take 100 mg by mouth daily.    . Turmeric 500 MG CAPS Take by mouth.    Marland Kitchen VITAMIN K PO Take 150 mcg by mouth daily.     No current facility-administered medications on file prior to visit.     PAST MEDICAL HISTORY: Past Medical History:  Diagnosis Date  . Anxiety   . GERD (gastroesophageal reflux disease)   . Infertility, female   . PCOS (polycystic ovarian syndrome)   . Pre-diabetes   . Vaginismus   . Vitamin D deficiency     PAST SURGICAL HISTORY: No past surgical history on file.  SOCIAL HISTORY: Social History   Tobacco Use  . Smoking status: Never Smoker  . Smokeless tobacco: Never Used  Substance Use Topics  . Alcohol use: No  . Drug use: No    FAMILY HISTORY: Family History  Problem Relation Age of Onset  . High  blood pressure Mother   . Thyroid disease Mother   . Obesity Mother   . AAA (abdominal aortic aneurysm) Father     ROS: Review of Systems  Constitutional: Positive for malaise/fatigue and weight loss.  Gastrointestinal: Positive for constipation. Negative for melena, nausea and vomiting.       Negative hematochezia  Genitourinary: Negative for frequency.  Musculoskeletal:       Negative muscle weakness  Endo/Heme/Allergies: Negative for polydipsia.    PHYSICAL EXAM: Blood pressure 117/76, pulse 89, temperature 98.3 F (36.8 C), temperature source Oral, height 5\' 1"  (1.549 m), weight 182 lb (82.6 kg), last menstrual period 12/31/2017, SpO2 99 %. Body  mass index is 34.39 kg/m. Physical Exam  Constitutional: She is oriented to person, place, and time. She appears well-developed and well-nourished.  Cardiovascular: Normal rate.  Pulmonary/Chest: Effort normal.  Musculoskeletal: Normal range of motion.  Neurological: She is oriented to person, place, and time.  Skin: Skin is warm and dry.  Psychiatric: She has a normal mood and affect. Her behavior is normal.  Vitals reviewed.   RECENT LABS AND TESTS: BMET    Component Value Date/Time   NA 141 12/23/2017 1249   K 4.2 12/23/2017 1249   CL 103 12/23/2017 1249   CO2 23 12/23/2017 1249   GLUCOSE 72 12/23/2017 1249   GLUCOSE 110 (H) 08/04/2013 2248   BUN 9 12/23/2017 1249   CREATININE 0.81 12/23/2017 1249   CALCIUM 9.2 12/23/2017 1249   GFRNONAA 98 12/23/2017 1249   GFRAA 113 12/23/2017 1249   Lab Results  Component Value Date   HGBA1C 5.6 12/23/2017   Lab Results  Component Value Date   INSULIN 13.4 12/23/2017   CBC    Component Value Date/Time   WBC 9.7 12/23/2017 1249   WBC 14.2 (H) 08/04/2013 2248   RBC 4.68 12/23/2017 1249   RBC 4.62 08/04/2013 2248   HGB 12.8 12/23/2017 1249   HCT 40.5 12/23/2017 1249   PLT 427 (H) 08/04/2013 2248   MCV 87 12/23/2017 1249   MCH 27.4 12/23/2017 1249   MCH 29.2 08/04/2013 2248   MCHC 31.6 12/23/2017 1249   MCHC 33.7 08/04/2013 2248   RDW 12.5 12/23/2017 1249   LYMPHSABS 2.6 12/23/2017 1249   MONOABS 0.8 08/04/2013 2239   EOSABS 0.1 12/23/2017 1249   BASOSABS 0.0 12/23/2017 1249   Iron/TIBC/Ferritin/ %Sat No results found for: IRON, TIBC, FERRITIN, IRONPCTSAT Lipid Panel     Component Value Date/Time   CHOL 165 12/23/2017 1249   TRIG 54 12/23/2017 1249   HDL 40 12/23/2017 1249   LDLCALC 114 (H) 12/23/2017 1249   Hepatic Function Panel     Component Value Date/Time   PROT 7.0 12/23/2017 1249   ALBUMIN 3.9 12/23/2017 1249   AST 14 12/23/2017 1249   ALT 12 12/23/2017 1249   ALKPHOS 93 12/23/2017 1249   BILITOT  0.3 12/23/2017 1249   BILIDIR <0.10 06/12/2016 1309      Component Value Date/Time   TSH 1.290 12/23/2017 1249  Results for PAIDEN, CARAVEO (MRN [id number]) as of 01/31/2018 14:33  Ref. Range 12/23/2017 12:49  Vitamin D, 25-Hydroxy Latest Ref Range: 30.0 - 100.0 ng/mL 46.1    ASSESSMENT AND PLAN: Insulin resistance - Plan: metFORMIN (GLUCOPHAGE) 500 MG tablet  Vitamin D deficiency  Other constipation  At risk for diabetes mellitus  Class 1 obesity with serious comorbidity and body mass index (BMI) of 34.0 to 34.9 in adult, unspecified obesity type  PLAN:  Insulin Resistance  Honesti will continue to work on weight loss, exercise, and decreasing simple carbohydrates in her diet to help decrease the risk of diabetes. We dicussed metformin including benefits and risks. She was informed that eating too many simple carbohydrates or too many calories at one sitting increases the likelihood of GI side effects. Felipe agrees to start metformin 500 mg PO q AM #30 with no refills. Mollyann agrees to follow up with our clinic in 2 weeks as directed to monitor her progress.  Diabetes risk counselling Mckell was given extended (15 minutes) diabetes prevention counseling today. She is 32 y.o. female and has risk factors for diabetes including obesity and insulin resistance. We discussed intensive lifestyle modifications today with an emphasis on weight loss as well as increasing exercise and decreasing simple carbohydrates in her diet.  Vitamin D Deficiency Aunesti was informed that low vitamin D levels contributes to fatigue and are associated with obesity, breast, and colon cancer. Jaycee agrees to continue taking OTC Vit D and will follow up for routine testing of vitamin D, at least 2-3 times per year. She was informed of the risk of over-replacement of vitamin D and agrees to not increase her dose unless she discusses this with [place] first. Eli agrees to follow up with our clinic in 2  weeks.  Constipation Terresa was informed decrease bowel movement frequency is normal while losing weight, but stools should not be hard or painful. She was advised to increase her H20 intake and work on increasing her fiber intake. High fiber foods were discussed today. [place] agrees to start metamucil or benefiber daily, and if no BM for 3-4 days then she is to switch to miralax. Sunnie agrees to follow up with our clinic in 2 weeks.  Obesity Merline is currently in the action stage of change. As such, her goal is to continue with weight loss efforts She has agreed to follow the Category 2 plan, Dinner 4/7 days of category 2 Journei has been instructed to work up to a goal of 150 minutes of combined cardio and strengthening exercise per week for weight loss and overall health benefits. We discussed the following Behavioral Modification Strategies today: increasing lean protein intake, increasing vegetables, work on meal planning and easy cooking plans, and planning for success We discussed various medication options to help Davena with her weight loss efforts and we both agreed to discontinue Saxenda.  Ziara has agreed to follow up with our clinic in 2 weeks. She was informed of the importance of frequent follow up visits to maximize her success with intensive lifestyle modifications for her multiple health conditions.   OBESITY BEHAVIORAL INTERVENTION VISIT  Today's visit was # 3   Starting weight: 183 lbs Starting date: 12/23/17 Today's weight : 182 lbs  Today's date: 01/27/2018 Total lbs lost to date: 1    ASK: We discussed the diagnosis of obesity with [name] today and Chava agreed to give [place] permission to discuss obesity behavioral modification therapy today.  ASSESS: Eurika has the diagnosis of obesity and her BMI today is 34.41 Kalany is in the action stage of change   ADVISE: Vonne was educated on the multiple health risks of obesity as well as the  benefit of weight loss to improve her health. She was advised of the need for long term treatment and the importance of lifestyle modifications to improve her current health and to decrease her risk of future health problems.  AGREE: Multiple dietary modification options and treatment options were discussed  and  Blaize agreed to follow the recommendations documented in the above note.  ARRANGE: Baylie was educated on the importance of frequent visits to treat obesity as outlined per CMS and USPSTF guidelines and agreed to schedule her next follow up appointment today.  I, Burt Knack, am acting as transcriptionist for Debbra Riding, MD  I have reviewed the above documentation for accuracy and completeness, and I agree with the above. - Debbra Riding, MD

## 2018-02-01 DIAGNOSIS — F430 Acute stress reaction: Secondary | ICD-10-CM | POA: Diagnosis not present

## 2018-02-14 ENCOUNTER — Ambulatory Visit (INDEPENDENT_AMBULATORY_CARE_PROVIDER_SITE_OTHER): Payer: 59 | Admitting: Family Medicine

## 2018-02-14 VITALS — BP 124/88 | HR 82 | Temp 98.0°F | Ht 61.0 in | Wt 184.0 lb

## 2018-02-14 DIAGNOSIS — Z6834 Body mass index (BMI) 34.0-34.9, adult: Secondary | ICD-10-CM | POA: Diagnosis not present

## 2018-02-14 DIAGNOSIS — F3289 Other specified depressive episodes: Secondary | ICD-10-CM | POA: Diagnosis not present

## 2018-02-14 DIAGNOSIS — E669 Obesity, unspecified: Secondary | ICD-10-CM | POA: Diagnosis not present

## 2018-02-14 DIAGNOSIS — E559 Vitamin D deficiency, unspecified: Secondary | ICD-10-CM

## 2018-02-14 DIAGNOSIS — Z9189 Other specified personal risk factors, not elsewhere classified: Secondary | ICD-10-CM | POA: Diagnosis not present

## 2018-02-14 DIAGNOSIS — F430 Acute stress reaction: Secondary | ICD-10-CM | POA: Diagnosis not present

## 2018-02-14 MED ORDER — SERTRALINE HCL 25 MG PO TABS: 25.0000 mg | ORAL_TABLET | Freq: Every day | ORAL | 0 refills | Status: DC

## 2018-02-14 MED FILL — SERTRALINE HCL 25 MG TABLET: 25 | 30 days supply | Qty: 30 | Fill #0

## 2018-02-14 NOTE — Progress Notes (Signed)
Office: [phone]  /  Fax: [phone]   HPI:   Chief Complaint: OBESITY Cynthia Figueroa is here to discuss her progress with her obesity treatment plan. She is on the Category 2 plan, dinner 4/7 days of Category 2 plan and is following her eating plan approximately 100 % of the time. She states she is exercising 0 minutes 0 times per week. Katee voices she had a lot of emotional eating with increased stress, secondary to her husband's recent surgery, dogs missing, and increased hours at work. Even when she voices she ate, she significantly overindulged with sweets.  Her weight is 184 lb (83.5 kg) today and has gained 2 pounds since her last visit. She has lost 0 lbs since starting treatment with [place].  Vitamin D Deficiency Cynthia Figueroa has a diagnosis of vitamin D deficiency. She is on OTC Vit D 2,000 IU daily and denies nausea, vomiting or muscle weakness.  At risk for osteopenia and osteoporosis Cynthia Figueroa is at higher risk of osteopenia and osteoporosis due to vitamin D deficiency.   Depression with emotional eating behaviors Cynthia Figueroa is seeing a Veterinary surgeon at [facility]. [name] struggles with emotional eating and using food for comfort to the extent that it is negatively impacting her health. She often snacks when she is not hungry. Cynthia Figueroa sometimes feels she is out of control and then feels guilty that she made poor food choices. She has been working on behavior modification techniques to help reduce her emotional eating and has been somewhat successful. She shows no sign of suicidal or homicidal ideations.  Depression screen PHQ 2/9 12/23/2017  Decreased Interest 1  Down, Depressed, Hopeless 3  PHQ - 2 Score 4  Altered sleeping 2  Tired, decreased energy 3  Change in appetite 2  Feeling bad or failure about yourself  1  Trouble concentrating 3  Moving slowly or fidgety/restless 0  Suicidal thoughts 0  PHQ-9 Score 15  Difficult doing work/chores Somewhat difficult     ALLERGIES: Allergies  Allergen Reactions  . Lamisil [Terbinafine] Hives    MEDICATIONS: Current Outpatient Medications on File Prior to Visit  Medication Sig Dispense Refill  . ASHWAGANDHA PO Take 300 mg by mouth daily.    . Cholecalciferol (VITAMIN D3) 2000 units CHEW Chew by mouth.    . Cranberry 500 MG CAPS Take by mouth.    . IRON PO Take 1 tablet by mouth daily.    Marland Kitchen itraconazole (SPORANOX) 100 MG capsule Take 1 capsule (100 mg total) by mouth daily. 90 capsule 0  . MAGNESIUM PO Take 1 tablet by mouth daily.    . metFORMIN (GLUCOPHAGE) 500 MG tablet Take 1 tablet (500 mg total) by mouth daily with breakfast. 30 tablet 0  . Multiple Vitamins-Minerals (MULTIVITAMIN PO) Take by mouth.    . NON FORMULARY Lemon balm herb  330 mg /mL qd prn    . Probiotic Product (PROBIOTIC DAILY PO) Take by mouth.    . progesterone (PROMETRIUM) 100 MG capsule Take 100 mg by mouth daily.    . Turmeric 500 MG CAPS Take by mouth.    Marland Kitchen VITAMIN K PO Take 150 mcg by mouth daily.     No current facility-administered medications on file prior to visit.     PAST MEDICAL HISTORY: Past Medical History:  Diagnosis Date  . Anxiety   . GERD (gastroesophageal reflux disease)   . Infertility, female   . PCOS (polycystic ovarian syndrome)   . Pre-diabetes   . Vaginismus   .  Vitamin D deficiency     PAST SURGICAL HISTORY: No past surgical history on file.  SOCIAL HISTORY: Social History   Tobacco Use  . Smoking status: Never Smoker  . Smokeless tobacco: Never Used  Substance Use Topics  . Alcohol use: No  . Drug use: No    FAMILY HISTORY: Family History  Problem Relation Age of Onset  . High blood pressure Mother   . Thyroid disease Mother   . Obesity Mother   . AAA (abdominal aortic aneurysm) Father     ROS: Review of Systems  Constitutional: Negative for weight loss.  Gastrointestinal: Negative for nausea and vomiting.  Musculoskeletal:       Negative muscle weakness   Psychiatric/Behavioral: Positive for depression. Negative for suicidal ideas.    PHYSICAL EXAM: Blood pressure 124/88, pulse 82, temperature 98 F (36.7 C), temperature source Oral, height 5\' 1"  (1.549 m), weight 184 lb (83.5 kg), SpO2 99 %. Body mass index is 34.77 kg/m. Physical Exam  Constitutional: She is oriented to person, place, and time. She appears well-developed and well-nourished.  Cardiovascular: Normal rate.  Pulmonary/Chest: Effort normal.  Musculoskeletal: Normal range of motion.  Neurological: She is oriented to person, place, and time.  Skin: Skin is warm and dry.  Psychiatric: She has a normal mood and affect. Her behavior is normal.  Vitals reviewed.   RECENT LABS AND TESTS: BMET    Component Value Date/Time   NA 141 12/23/2017 1249   K 4.2 12/23/2017 1249   CL 103 12/23/2017 1249   CO2 23 12/23/2017 1249   GLUCOSE 72 12/23/2017 1249   GLUCOSE 110 (H) 08/04/2013 2248   BUN 9 12/23/2017 1249   CREATININE 0.81 12/23/2017 1249   CALCIUM 9.2 12/23/2017 1249   GFRNONAA 98 12/23/2017 1249   GFRAA 113 12/23/2017 1249   Lab Results  Component Value Date   HGBA1C 5.6 12/23/2017   Lab Results  Component Value Date   INSULIN 13.4 12/23/2017   CBC    Component Value Date/Time   WBC 9.7 12/23/2017 1249   WBC 14.2 (H) 08/04/2013 2248   RBC 4.68 12/23/2017 1249   RBC 4.62 08/04/2013 2248   HGB 12.8 12/23/2017 1249   HCT 40.5 12/23/2017 1249   PLT 427 (H) 08/04/2013 2248   MCV 87 12/23/2017 1249   MCH 27.4 12/23/2017 1249   MCH 29.2 08/04/2013 2248   MCHC 31.6 12/23/2017 1249   MCHC 33.7 08/04/2013 2248   RDW 12.5 12/23/2017 1249   LYMPHSABS 2.6 12/23/2017 1249   MONOABS 0.8 08/04/2013 2239   EOSABS 0.1 12/23/2017 1249   BASOSABS 0.0 12/23/2017 1249   Iron/TIBC/Ferritin/ %Sat No results found for: IRON, TIBC, FERRITIN, IRONPCTSAT Lipid Panel     Component Value Date/Time   CHOL 165 12/23/2017 1249   TRIG 54 12/23/2017 1249   HDL 40  12/23/2017 1249   LDLCALC 114 (H) 12/23/2017 1249   Hepatic Function Panel     Component Value Date/Time   PROT 7.0 12/23/2017 1249   ALBUMIN 3.9 12/23/2017 1249   AST 14 12/23/2017 1249   ALT 12 12/23/2017 1249   ALKPHOS 93 12/23/2017 1249   BILITOT 0.3 12/23/2017 1249   BILIDIR <0.10 06/12/2016 1309      Component Value Date/Time   TSH 1.290 12/23/2017 1249  Results for LAZARIA, SCHABEN (MRN [id number]) as of 02/14/2018 17:52  Ref. Range 12/23/2017 12:49  Vitamin D, 25-Hydroxy Latest Ref Range: 30.0 - 100.0 ng/mL 46.1    ASSESSMENT AND PLAN:  Vitamin D deficiency  Other depression - Plan: sertraline (ZOLOFT) 25 MG tablet  At risk for osteoporosis  Class 1 obesity with serious comorbidity and body mass index (BMI) of 34.0 to 34.9 in adult, unspecified obesity type  PLAN:  Vitamin D Deficiency [place] was informed that low vitamin D levels contributes to fatigue and are associated with obesity, breast, and colon cancer. [place] agrees to continue taking current OTC Vit D supplementation and will follow up for routine testing of vitamin D, at least 2-3 times per year. She was informed of the risk of over-replacement of vitamin D and agrees to not increase her dose unless she discusses this with [place] first. Vanya agrees to follow up with our clinic in 2 weeks.  At risk for osteopenia and osteoporosis November was given extended (15 minutes) osteoporosis prevention counseling today. Harini is at risk for osteopenia and osteoporsis due to her vitamin D deficiency. She was encouraged to take her vitamin D and follow her higher calcium diet and increase strengthening exercise to help strengthen her bones and decrease her risk of osteopenia and osteoporosis.  Depression with Emotional Eating Behaviors We discussed behavior modification techniques today to help Cheresa deal with her emotional eating and depression. Kiante agrees to start Zoloft 25 mg PO q daily #30 with no refills. We  will refer to Dr. Dewaine Conger, our bariatric psychologist. Ashlay agrees to follow up with our clinic in 2 weeks.  Obesity Alvita is currently in the action stage of change. As such, her goal is to continue with weight loss efforts She has agreed to follow the Category 2 plan Amira has been instructed to work up to a goal of 150 minutes of combined cardio and strengthening exercise per week for weight loss and overall health benefits. We discussed the following Behavioral Modification Strategies today: increasing lean protein intake, increasing vegetables, work on meal planning and easy cooking plans, and planning for success   Keshana has agreed to follow up with our clinic in 2 weeks. She was informed of the importance of frequent follow up visits to maximize her success with intensive lifestyle modifications for her multiple health conditions.   OBESITY BEHAVIORAL INTERVENTION VISIT  Today's visit was # 4   Starting weight: 183 lbs Starting date: 12/23/17 Today's weight : 184 lbs  Today's date: 02/14/2018 Total lbs lost to date: 0    ASK: We discussed the diagnosis of obesity with [name] today and Shenique agreed to give [place] permission to discuss obesity behavioral modification therapy today.  ASSESS: Naquisha has the diagnosis of obesity and her BMI today is 34.78 Vincenzina is in the action stage of change   ADVISE: Noelle was educated on the multiple health risks of obesity as well as the benefit of weight loss to improve her health. She was advised of the need for long term treatment and the importance of lifestyle modifications to improve her current health and to decrease her risk of future health problems.  AGREE: Multiple dietary modification options and treatment options were discussed and  Rory agreed to follow the recommendations documented in the above note.  ARRANGE: Brookelynne was educated on the importance of frequent visits to treat obesity as outlined per CMS  and USPSTF guidelines and agreed to schedule her next follow up appointment today.  I, Burt Knack, am acting as transcriptionist for Debbra Riding, MD  I have reviewed the above documentation for accuracy and completeness, and I agree with the above. - Debbra Riding, MD

## 2018-02-22 DIAGNOSIS — F430 Acute stress reaction: Secondary | ICD-10-CM | POA: Diagnosis not present

## 2018-02-22 MED FILL — PROGESTERONE 100 MG CAPSULE: 100 | 28 days supply | Qty: 12 | Fill #1

## 2018-02-23 ENCOUNTER — Other Ambulatory Visit: Payer: Self-pay | Admitting: Podiatry

## 2018-03-03 ENCOUNTER — Ambulatory Visit (INDEPENDENT_AMBULATORY_CARE_PROVIDER_SITE_OTHER): Payer: 59 | Admitting: Family Medicine

## 2018-03-03 ENCOUNTER — Other Ambulatory Visit: Payer: Self-pay | Admitting: Internal Medicine

## 2018-03-03 ENCOUNTER — Encounter (INDEPENDENT_AMBULATORY_CARE_PROVIDER_SITE_OTHER): Payer: Self-pay

## 2018-03-03 ENCOUNTER — Encounter (INDEPENDENT_AMBULATORY_CARE_PROVIDER_SITE_OTHER): Payer: Self-pay | Admitting: Family Medicine

## 2018-03-03 ENCOUNTER — Ambulatory Visit (INDEPENDENT_AMBULATORY_CARE_PROVIDER_SITE_OTHER): Payer: 59 | Admitting: Psychology

## 2018-03-03 VITALS — BP 113/78 | HR 63 | Temp 98.3°F | Ht 61.0 in | Wt 185.0 lb

## 2018-03-03 DIAGNOSIS — R7303 Prediabetes: Secondary | ICD-10-CM

## 2018-03-03 DIAGNOSIS — Z6835 Body mass index (BMI) 35.0-35.9, adult: Secondary | ICD-10-CM | POA: Diagnosis not present

## 2018-03-03 DIAGNOSIS — F3289 Other specified depressive episodes: Secondary | ICD-10-CM | POA: Diagnosis not present

## 2018-03-03 DIAGNOSIS — Z9189 Other specified personal risk factors, not elsewhere classified: Secondary | ICD-10-CM

## 2018-03-03 DIAGNOSIS — E66812 Obesity, class 2: Secondary | ICD-10-CM

## 2018-03-03 MED ORDER — LIRAGLUTIDE -WEIGHT MANAGEMENT 18 MG/3ML ~~LOC~~ SOPN: 0.6000 mg | PEN_INJECTOR | Freq: Every day | SUBCUTANEOUS | 0 refills | Status: DC

## 2018-03-07 ENCOUNTER — Other Ambulatory Visit: Payer: Self-pay | Admitting: Internal Medicine

## 2018-03-08 NOTE — Progress Notes (Signed)
Office: [phone]  /  Fax: [phone]   HPI:   Chief Complaint: OBESITY Cynthia Figueroa is here to discuss her progress with her obesity treatment plan. She is on the  follow the Category 2 plan and is following her eating plan approximately 50 % of the time. She states she is exercising 0 minutes 0 times per week. Cynthia Figueroa first week after last appointment she was ill with URI and did not want to eat so would eat microwave meal for lunch and soup for dinner. She stopped metformin. She is getting tired of eating eggs for breakfast.  Her weight is 185 lb (83.9 kg) today and has had a weight loss of 1 pounds over a period of 3 weeks since her last visit. She has lost 0 lbs since starting treatment with [place].  Pre-Diabetes Cynthia Figueroa has a diagnosis of prediabetes based on her elevated HgA1c and was informed this puts her at greater risk of developing diabetes. She is not taking metformin currently, she stopped due to GI upset and continues to work on diet and exercise to decrease risk of diabetes. She denies nausea or hypoglycemia. She admits to occasional carb cravings.   Depression with emotional eating behaviors Cynthia Figueroa is struggling with emotional eating and using food for comfort to the extent that it is negatively impacting her health. She often snacks when she is not hungry. Cynthia Figueroa sometimes feels she is out of control and then feels guilty that she made poor food choices. She has been working on behavior modification techniques to help reduce her emotional eating and has been somewhat successful. She shows no sign of suicidal or homicidal ideations. She was scheduled to see Dr. Dewaine Conger today.   Depression screen PHQ 2/9 12/23/2017  Decreased Interest 1  Down, Depressed, Hopeless 3  PHQ - 2 Score 4  Altered sleeping 2  Tired, decreased energy 3  Change in appetite 2  Feeling bad or failure about yourself  1  Trouble concentrating 3  Moving slowly or fidgety/restless 0  Suicidal thoughts 0    PHQ-9 Score 15  Difficult doing work/chores Somewhat difficult    At risk for diabetes Cynthia Figueroa is at higher than averagerisk for developing diabetes due to her obesity. She currently denies polyuria or polydipsia.  ALLERGIES: Allergies  Allergen Reactions  . Lamisil [Terbinafine] Hives    MEDICATIONS: Current Outpatient Medications on File Prior to Visit  Medication Sig Dispense Refill  . ASHWAGANDHA PO Take 300 mg by mouth daily.    . Cholecalciferol (VITAMIN D3) 2000 units CHEW Chew by mouth.    . Cranberry 500 MG CAPS Take by mouth.    . IRON PO Take 1 tablet by mouth daily.    Marland Kitchen itraconazole (SPORANOX) 100 MG capsule Take 1 capsule (100 mg total) by mouth daily. 90 capsule 0  . MAGNESIUM PO Take 1 tablet by mouth daily.    . metFORMIN (GLUCOPHAGE) 500 MG tablet Take 1 tablet (500 mg total) by mouth daily with breakfast. 30 tablet 0  . Multiple Vitamins-Minerals (MULTIVITAMIN PO) Take by mouth.    . NON FORMULARY Lemon balm herb  330 mg /mL qd prn    . Probiotic Product (PROBIOTIC DAILY PO) Take by mouth.    . progesterone (PROMETRIUM) 100 MG capsule Take 100 mg by mouth daily.    . sertraline (ZOLOFT) 25 MG tablet Take 1 tablet (25 mg total) by mouth daily. 30 tablet 0  . Turmeric 500 MG CAPS Take by mouth.    Marland Kitchen  VITAMIN K PO Take 150 mcg by mouth daily.     No current facility-administered medications on file prior to visit.     PAST MEDICAL HISTORY: Past Medical History:  Diagnosis Date  . Anxiety   . GERD (gastroesophageal reflux disease)   . Infertility, female   . PCOS (polycystic ovarian syndrome)   . Pre-diabetes   . Vaginismus   . Vitamin D deficiency     PAST SURGICAL HISTORY: No past surgical history on file.  SOCIAL HISTORY: Social History   Tobacco Use  . Smoking status: Never Smoker  . Smokeless tobacco: Never Used  Substance Use Topics  . Alcohol use: No  . Drug use: No    FAMILY HISTORY: Family History  Problem Relation Age of Onset   . High blood pressure Mother   . Thyroid disease Mother   . Obesity Mother   . AAA (abdominal aortic aneurysm) Father     ROS: Review of Systems  Constitutional: Negative for weight loss.  Gastrointestinal: Negative for nausea.  Endo/Heme/Allergies: Negative for polydipsia.       Negative for hypoglycemia Negative for polyuria  Psychiatric/Behavioral: Positive for depression. Negative for suicidal ideas.       Negative for homicidal ideations    PHYSICAL EXAM: Blood pressure 113/78, pulse 63, temperature 98.3 F (36.8 C), temperature source Oral, height 5\' 1"  (1.549 m), weight 185 lb (83.9 kg), SpO2 96 %. Body mass index is 34.96 kg/m. Physical Exam  Constitutional: She is oriented to person, place, and time. She appears well-developed and well-nourished.  HENT:  Head: Normocephalic.  Neck: Normal range of motion.  Cardiovascular: Normal rate.  Pulmonary/Chest: Effort normal.  Musculoskeletal: Normal range of motion.  Neurological: She is alert and oriented to person, place, and time.  Skin: Skin is warm and dry.  Psychiatric: She has a normal mood and affect. Her behavior is normal.  Vitals reviewed.   RECENT LABS AND TESTS: BMET    Component Value Date/Time   NA 141 12/23/2017 1249   K 4.2 12/23/2017 1249   CL 103 12/23/2017 1249   CO2 23 12/23/2017 1249   GLUCOSE 72 12/23/2017 1249   GLUCOSE 110 (H) 08/04/2013 2248   BUN 9 12/23/2017 1249   CREATININE 0.81 12/23/2017 1249   CALCIUM 9.2 12/23/2017 1249   GFRNONAA 98 12/23/2017 1249   GFRAA 113 12/23/2017 1249   Lab Results  Component Value Date   HGBA1C 5.6 12/23/2017   Lab Results  Component Value Date   INSULIN 13.4 12/23/2017   CBC    Component Value Date/Time   WBC 9.7 12/23/2017 1249   WBC 14.2 (H) 08/04/2013 2248   RBC 4.68 12/23/2017 1249   RBC 4.62 08/04/2013 2248   HGB 12.8 12/23/2017 1249   HCT 40.5 12/23/2017 1249   PLT 427 (H) 08/04/2013 2248   MCV 87 12/23/2017 1249   MCH 27.4  12/23/2017 1249   MCH 29.2 08/04/2013 2248   MCHC 31.6 12/23/2017 1249   MCHC 33.7 08/04/2013 2248   RDW 12.5 12/23/2017 1249   LYMPHSABS 2.6 12/23/2017 1249   MONOABS 0.8 08/04/2013 2239   EOSABS 0.1 12/23/2017 1249   BASOSABS 0.0 12/23/2017 1249   Iron/TIBC/Ferritin/ %Sat No results found for: IRON, TIBC, FERRITIN, IRONPCTSAT Lipid Panel     Component Value Date/Time   CHOL 165 12/23/2017 1249   TRIG 54 12/23/2017 1249   HDL 40 12/23/2017 1249   LDLCALC 114 (H) 12/23/2017 1249   Hepatic Function Panel  Component Value Date/Time   PROT 7.0 12/23/2017 1249   ALBUMIN 3.9 12/23/2017 1249   AST 14 12/23/2017 1249   ALT 12 12/23/2017 1249   ALKPHOS 93 12/23/2017 1249   BILITOT 0.3 12/23/2017 1249   BILIDIR <0.10 06/12/2016 1309      Component Value Date/Time   TSH 1.290 12/23/2017 1249    ASSESSMENT AND PLAN: Prediabetes  Other depression - with emotional eating  At risk for diabetes mellitus  Class 2 severe obesity with serious comorbidity and body mass index (BMI) of 35.0 to 35.9 in adult, unspecified obesity type (HCC) - Plan: Liraglutide -Weight Management (SAXENDA) 18 MG/3ML SOPN  PLAN: Pre-Diabetes Cynthia Figueroa will continue to work on weight loss, exercise, and decreasing simple carbohydrates in her diet to help decrease the risk of diabetes. We dicussed metformin including benefits and risks. She was informed that eating too many simple carbohydrates or too many calories at one sitting increases the likelihood of GI side effects. Cynthia Figueroa agreed to follow up with us as directed to monitor her progress. We will repeat labs in 2 months.   Depression with Emotional Eating Behaviors We discussed behavior modification techniques today to help Cynthia Figueroa deal with her emotional eating and depression. She has agreed to zoloft as prescribed and agreed to follow up as directed.  Diabetes risk counseling Cynthia Figueroa was given extended (15 minutes) diabetes prevention counseling  today. She is 32 y.o. female and has risk factors for diabetes including obesity. We discussed intensive lifestyle modifications today with an emphasis on weight loss as well as increasing exercise and decreasing simple carbohydrates in her diet.  Obesity Cynthia Figueroa is currently in the action stage of change. As such, her goal is to continue with weight loss efforts She has agreed to follow the Category 2 plan Cynthia Figueroa has been instructed to work up to a goal of 150 minutes of combined cardio and strengthening exercise per week for weight loss and overall health benefits. We discussed the following Behavioral Modification Strategies today: increasing lean protein intake, increasing lower sugar fruits, work on meal planning and easy cooking plans, planning for success, and holiday eating strategies    Cynthia Figueroa has agreed to follow up with our clinic in 2 weeks. She was informed of the importance of frequent follow up visits to maximize her success with intensive lifestyle modifications for her multiple health conditions.   OBESITY BEHAVIORAL INTERVENTION VISIT  Today's visit was # 5   Starting weight: 183 lb Starting date: 12/23/17 Today's weight : Weight: 185 lb (83.9 kg)  Today's date: 03/03/18 Total lbs lost to date: 0 At least 15 minutes were spent on discussing the following behavioral intervention visit.   ASK: We discussed the diagnosis of obesity with Cynthia Figueroa today and Cynthia Figueroa agreed to give us permission to discuss obesity behavioral modification therapy today.  ASSESS: Cynthia Figueroa has the diagnosis of obesity and her BMI today is 34.97 Cynthia Figueroa is in the action stage of change   ADVISE: Cynthia Figueroa was educated on the multiple health risks of obesity as well as the benefit of weight loss to improve her health. She was advised of the need for long term treatment and the importance of lifestyle modifications to improve her current health and to decrease her risk of future health  problems.  AGREE: Multiple dietary modification options and treatment options were discussed and  Cynthia Figueroa agreed to follow the recommendations documented in the above note.  ARRANGE: Cynthia Figueroa was educated on the importance of frequent visits to treat obesity  as outlined per CMS and USPSTF guidelines and agreed to schedule her next follow up appointment today.  I, Renee Ramus, am acting as transcriptionist for Ilene Qua, MD   I have reviewed the above documentation for accuracy and completeness, and I agree with the above. - Ilene Qua, MD

## 2018-03-09 DIAGNOSIS — F430 Acute stress reaction: Secondary | ICD-10-CM | POA: Diagnosis not present

## 2018-03-14 DIAGNOSIS — F430 Acute stress reaction: Secondary | ICD-10-CM | POA: Diagnosis not present

## 2018-03-16 ENCOUNTER — Other Ambulatory Visit (INDEPENDENT_AMBULATORY_CARE_PROVIDER_SITE_OTHER): Payer: Self-pay | Admitting: Family Medicine

## 2018-03-16 DIAGNOSIS — F3289 Other specified depressive episodes: Secondary | ICD-10-CM

## 2018-03-17 MED FILL — SERTRALINE HCL 25 MG TABLET: 25 | 30 days supply | Qty: 30 | Fill #0

## 2018-03-21 ENCOUNTER — Encounter (INDEPENDENT_AMBULATORY_CARE_PROVIDER_SITE_OTHER): Payer: Self-pay

## 2018-03-23 NOTE — Progress Notes (Signed)
Office: [phone]  /  Fax: [phone]   Date:March 24, 2018 Time Seen: 10:00am Duration: 48 minutes Provider: Glennie Isle, PsyD Type of Session: Intake for Individual Therapy   Informed Consent:The provider's role was explained to [employer]. The provider reviewed and discussed issues of confidentiality, privacy, and limits therein. In addition to verbal informed consent, written informed consent for psychological services was obtained from [facility] prior to the initial intake interview. Written consent included information concerning the practice, financial arrangements, and confidentiality and patients' rights. Since the clinic is not a 24/7 crisis center, mental health emergency resources were shared and a handout was provided. The provider further explained the utilization of MyChart, e-mail, voicemail, and/or other messaging systems can be utilized for non-emergency reasons. Shiori verbally acknowledged understanding of the aforementioned, and agreed to use mental health emergency resources discussed if needed. Moreover, Desirey agreed information may be shared with other [facility] providers as needed for coordination of care, and written consent was obtained.   Chief Complaint: Nyemah was referred by Dr. Ilene Qua due to depression with emotional eating behaviors. Per the note for the visit with Dr. Ilene Qua on March 03, 2018, "Yohana is struggling with emotional eating and using food for comfort to the extent that it is negatively impacting her health. She often snacks when she is not hungry. Chaslyn sometimes feels she is out of control and then feels guilty that she made poor food choices. She has been working on behavior modification techniques to help reduce her emotional eating and has been somewhat successful. She shows no sign of suicidal or homicidal ideations. She was scheduled to see Dr. Mallie Mussel today."  During today's  appointment, Rashanda reported she started observing she is in "emotional eater" since starting with this clinic.  She recalls engaging in emotional eating approximately 2 weeks ago, but could not recall additional details. Regarding overeating, Sharin indicated she over ate pasta this past Tuesday that was cooked by her stepdaughter as she did not want to hurt her stepdaughter's feelings. She shared she tends to crave sweets, including chocolate and cake.  Jumanah was asked to complete a questionnaire assessing various behaviors related to emotional eating. Andreanna endorsed the following: overeat when you are celebrating, experience food cravings on a regular basis, eat certain foods when you are anxious, stressed, depressed, or your feelings are hurt, find food is comforting to you, overeat when you are angry or upset, overeat frequently when you are bored or lonely, not worry about what you eat when you are in a good mood, eat to help you stay awake and eat as a reward.  HPI: Per the note for the initial visit with Dr. Ilene Qua on December 23, 2017, Ayjah started gaining weight when got married five years ago and her heaviest weight ever was 197 pounds. During the initial appointment with Dr. Ilene Qua, Ronny Bacon reported experiencing the following: snacking frequently in the evenings, frequently drinking liquids with calories, frequently making poor food choices, frequently eating larger portions than normal  and struggling with emotional eating. She also described herself as a picky eater and she does not like to eat healthier foods. In addition, she skips meals frequently. During today's appointment, Dunya reported the onset of emotional eating was after she was married approximately 5 years ago. She noted nothing significant occurred at that time aside from getting married and having 3 stepchildren reside with her. Khelani reported she began engaging in overeating a couple years ago.  She denied a history of binge eating. Ahmiya also denied history of purging and engagement in other compensatory strategies. She has never been diagnosed with an eating disorder.  Mental Status Examination: Donnalyn arrived on time for the appointment. She presented as appropriately dressed and groomed. Nabiha appeared her stated age and demonstrated adequate orientation to time, place, person, and purpose of the appointment. She also demonstrated appropriate eye contact. No psychomotor abnormalities or behavioral peculiarities noted. Her mood was euthymic with congruent affect. Her thought processes were logical, linear, and goal-directed. No hallucinations, delusions, bizarre thinking or behavior reported or observed. Judgment, insight, and impulse control appeared to be grossly intact. There was no evidence of paraphasias (i.e., errors in speech, gross mispronunciations, and word substitutions), repetition deficits, or disturbances in volume or prosody (i.e., rhythm and intonation). There was no evidence of attention or memory impairments. Maansi denied current suicidal and homicidal ideation, plan, and intent.   The Mini-Mental State Examination, Second Edition (MMSE-2) was administered. The MMSE-2 briefly screens for cognitive dysfunction and overall mental status and assesses different cognitive domains: orientation, registration, attention and calculation, recall, and language and praxis. Sumaiya received 25 out of 30 points possible on the MMSE-2, which is noted in the below the normal range. However, it is important to note that symptoms of depression and anxiety can impact scores on this measure. Per Vianne's self-report on the PHQ-9 and GAD-7, she is experiencing symptoms of depression and anxiety. In addition, when introduced to the attention and calculation task, Randilyn reported, "I can't do this." Thus, the results should be interpreted with caution. Danaiya lost one point on the orientation to  time task, as she erroneously identified the current season. Four points were lost on the attention and calculation task due to calculation errors of serial 7s.   Family & Psychosocial History: Nazaria reports she has been married for 5 years and has 3 stepchildren, ages 56, 65, and 63. Her 2 youngest stepchildren currently reside with her. Jenee shared she is employed full-time with [employer] as a Psychologist, sport and exercise. Her highest level of education obtained is an associates degree; however, she is currently in school part-time for a nursing degree. She indicated her social support system consists of her mother, friends at work, husband, and therapist.  She identifies with Christianity.  Medical History:  Past Medical History:  Diagnosis Date  . Anxiety   . GERD (gastroesophageal reflux disease)   . Infertility, female   . PCOS (polycystic ovarian syndrome)   . Pre-diabetes   . Vaginismus   . Vitamin D deficiency    No past surgical history on file. Current Outpatient Medications on File Prior to Visit  Medication Sig Dispense Refill  . ASHWAGANDHA PO Take 300 mg by mouth daily.    . Cholecalciferol (VITAMIN D3) 2000 units CHEW Chew by mouth.    . Cranberry 500 MG CAPS Take by mouth.    . IRON PO Take 1 tablet by mouth daily.    Marland Kitchen itraconazole (SPORANOX) 100 MG capsule Take 1 capsule (100 mg total) by mouth daily. 90 capsule 0  . Liraglutide -Weight Management (SAXENDA) 18 MG/3ML SOPN Inject 0.6 mg into the skin daily. 1 pen 0  . MAGNESIUM PO Take 1 tablet by mouth daily.    . metFORMIN (GLUCOPHAGE) 500 MG tablet Take 1 tablet (500 mg total) by mouth daily with breakfast. 30 tablet 0  . Multiple Vitamins-Minerals (MULTIVITAMIN PO) Take by mouth.    . NON FORMULARY Lemon balm herb  330 mg /mL qd prn    . Probiotic Product (PROBIOTIC DAILY PO) Take by mouth.    . progesterone (PROMETRIUM) 100 MG capsule Take 100 mg by mouth daily.    . sertraline (ZOLOFT) 25 MG tablet TAKE 1 TABLET (25  MG TOTAL) BY MOUTH DAILY. 30 tablet 0  . Turmeric 500 MG CAPS Take by mouth.    Marland Kitchen VITAMIN K PO Take 150 mcg by mouth daily.     No current facility-administered medications on file prior to visit.   Jonnae shared that as a baby she fell on a glass table and hit her head. She denied losing consciousness and received medical attention. She denied a history of any other head injuries and LOC.   Mental Health History: Eshani first received therapeutic services around 2016 in the form of individual therapy for vaginismus. She noted she attended one appointment and stopped due to the cost. Since last year, she and her husband reportedly have been meeting with a marriage therapist every 2 weeks. The therapist does not focus on emotional eating, and the therapist is aware that Dorette is meeting with this provider. Their next appointment is on March 31, 2018. The focus of marriage therapy is navigating relationships and she described it as "helpful." Merion indicated she has never met with a psychiatrist and explained the Zoloft is prescribed by Dr. Adair Patter. Milcah has never been hospitalized for psychiatric concerns and denied a family history of mental health related concerns. Odessie denied a trauma history, including psychological, physical and sexual abuse, as well as neglect.  Lalitha reported experiencing the following: anhedonia, depressed mood, hopelessness, fatigue, fluctuations in appetite, attention and concentration issues, irritability and becoming easily annoyed. Regarding hopelessness, she noted, "I have to pick and choose my battles." She explained she was referring to family. She also reported experiencing angry outbursts, and noted a history of throwing objects. More specifically, she indicated a history of throwing a comb at her husband during arguments; however, the last time was more than a few months ago. Lillien reported experiencing worry thoughts regarding the following: current  events and her wellbeing.  Marillyn denied experiencing the following: sleep difficulties, memory concerns, feeling fidgety/restless, obsessions and compulsions, hallucinations and delusions, mania, substance use, moving/speaking slowly and social withdrawal. She also denied experiencing history of and current suicidal ideation, plan, and intent; history of and current homicidal ideation, plan, and intent; and history of and current engagement in self-harm.  Structured Assessment Results: The Patient Health Questionnaire-9 (PHQ-9) is a self-report measure that assesses symptoms and severity of depression over the course of the last two weeks. Judie obtained a score of 11 suggesting moderate depression. Nazli finds the endorsed symptoms to be somewhat difficult. Depression screen PHQ 2/9 03/24/2018  Decreased Interest 1  Down, Depressed, Hopeless 2  PHQ - 2 Score 3  Altered sleeping 0  Tired, decreased energy 1  Change in appetite 3  Feeling bad or failure about yourself  2  Trouble concentrating 2  Moving slowly or fidgety/restless 0  Suicidal thoughts 0  PHQ-9 Score 11  Difficult doing work/chores -   The Generalized Anxiety Disorder-7 (GAD-7) is a brief self-report measure that assesses symptoms of anxiety over the course of the last two weeks. Abria obtained a score of 12 suggesting moderate anxiety. GAD 7 : Generalized Anxiety Score 03/24/2018  Nervous, Anxious, on Edge 1  Control/stop worrying 2  Worry too much - different things 2  Trouble relaxing 3  Restless 1  Easily  annoyed or irritable 3  Afraid - awful might happen 0  Total GAD 7 Score 12  Anxiety Difficulty Somewhat difficult   Interventions: A chart review was conducted prior to the clinical intake interview. The MMSE-2, PHQ-9, and GAD-7 were administered and a clinical intake interview was completed. In addition, Naryah was asked to complete a Mood and Food questionnaire to assess various behaviors related to  emotional eating. Throughout session, empathic reflections and validation was provided. Continuing treatment with this provider was discussed and a treatment goal was established. Psychoeducation regarding emotional versus physical hunger was provided. Tailey was given a handout to utilize between now and the next appointment to increase awareness of hunger patterns and subsequent eating.   Provisional DSM-5 Diagnosis: 311 (F32.8) Other Specified Depressive Disorder, Emotional Eating Behaviors  Plan: Dandria expressed understanding and agreement with the initial treatment plan of care. She appears able and willing to participate as evidenced by collaboration on a treatment goal, engagement in reciprocal conversation, and asking questions as needed for clarification. The next appointment will be scheduled in three to four weeks due to the holidays. The following treatment goal was established: decrease emotional eating. For the aforementioned goal, Shimeka can benefit from biweekly sessions that are brief in duration for approximately four to six sessions.

## 2018-03-24 ENCOUNTER — Ambulatory Visit (INDEPENDENT_AMBULATORY_CARE_PROVIDER_SITE_OTHER): Payer: 59 | Admitting: Family Medicine

## 2018-03-24 ENCOUNTER — Encounter (INDEPENDENT_AMBULATORY_CARE_PROVIDER_SITE_OTHER): Payer: Self-pay | Admitting: Family Medicine

## 2018-03-24 ENCOUNTER — Ambulatory Visit (INDEPENDENT_AMBULATORY_CARE_PROVIDER_SITE_OTHER): Payer: 59 | Admitting: Psychology

## 2018-03-24 VITALS — BP 122/78 | HR 71 | Temp 98.3°F | Ht 61.0 in | Wt 184.0 lb

## 2018-03-24 DIAGNOSIS — F418 Other specified anxiety disorders: Secondary | ICD-10-CM

## 2018-03-24 DIAGNOSIS — E669 Obesity, unspecified: Secondary | ICD-10-CM | POA: Diagnosis not present

## 2018-03-24 DIAGNOSIS — F3289 Other specified depressive episodes: Secondary | ICD-10-CM | POA: Diagnosis not present

## 2018-03-24 DIAGNOSIS — Z6834 Body mass index (BMI) 34.0-34.9, adult: Secondary | ICD-10-CM

## 2018-03-24 DIAGNOSIS — Z9189 Other specified personal risk factors, not elsewhere classified: Secondary | ICD-10-CM | POA: Diagnosis not present

## 2018-03-24 DIAGNOSIS — R7303 Prediabetes: Secondary | ICD-10-CM | POA: Diagnosis not present

## 2018-03-24 MED ORDER — SERTRALINE HCL 25 MG PO TABS: 25.0000 mg | ORAL_TABLET | Freq: Every day | ORAL | 0 refills | Status: DC

## 2018-03-24 NOTE — Progress Notes (Signed)
Office: [phone]  /  Fax: [phone]   HPI:   Chief Complaint: OBESITY Cynthia Figueroa is here to discuss her progress with her obesity treatment plan. She is on the Category 2 plan and is following her eating plan approximately 50 % of the time. She states she is exercising 0 minutes 0 times per week. Cynthia Figueroa was ill last week with a cold the a GI illness. She has 1 holiday party and family gathering in the next few weeks. When she is at work, she notes following the plan is more doable. She got back on Saxenda and denies GI issues.  Her weight is 184 lb (83.5 kg) today and has had a weight loss of 1 pound over a period of 3 weeks since her last visit. She has lost 0 lbs since starting treatment with [place].  Pre-Diabetes Cynthia Figueroa has a diagnosis of pre-diabetes based on her elevated Hgb A1c and was informed this puts her at greater risk of developing diabetes. She notes carbohydrate cravings and she is not on metformin anymore. She continues to work on diet and exercise to decrease risk of diabetes. She denies nausea or hypoglycemia.  At risk for diabetes Cynthia Figueroa is at higher than average risk for developing diabetes due to her obesity and pre-diabetes. She currently denies polyuria or polydipsia.  Depression with GAD Cynthia Figueroa struggles with depression with anxiety. She notes improvement in symptoms with medicine. She shows no sign of suicidal or homicidal ideations.  Depression screen [facility] 2/9 03/24/2018 12/23/2017  Decreased Interest 1 1  Down, Depressed, Hopeless 2 3  PHQ - 2 Score 3 4  Altered sleeping 0 2  Tired, decreased energy 1 3  Change in appetite 3 2  Feeling bad or failure about yourself  2 1  Trouble concentrating 2 3  Moving slowly or fidgety/restless 0 0  Suicidal thoughts 0 0  PHQ-9 Score 11 15  Difficult doing work/chores - Somewhat difficult    ALLERGIES: Allergies  Allergen Reactions  . Lamisil [Terbinafine] Hives    MEDICATIONS: Current Outpatient Medications  on File Prior to Visit  Medication Sig Dispense Refill  . ASHWAGANDHA PO Take 300 mg by mouth daily.    . Cholecalciferol (VITAMIN D3) 2000 units CHEW Chew by mouth.    . Cranberry 500 MG CAPS Take by mouth.    . IRON PO Take 1 tablet by mouth daily.    Marland Kitchen itraconazole (SPORANOX) 100 MG capsule Take 1 capsule (100 mg total) by mouth daily. 90 capsule 0  . Liraglutide -Weight Management (SAXENDA) 18 MG/3ML SOPN Inject 0.6 mg into the skin daily. 1 pen 0  . MAGNESIUM PO Take 1 tablet by mouth daily.    . metFORMIN (GLUCOPHAGE) 500 MG tablet Take 1 tablet (500 mg total) by mouth daily with breakfast. 30 tablet 0  . Multiple Vitamins-Minerals (MULTIVITAMIN PO) Take by mouth.    . NON FORMULARY Lemon balm herb  330 mg /mL qd prn    . Probiotic Product (PROBIOTIC DAILY PO) Take by mouth.    . progesterone (PROMETRIUM) 100 MG capsule Take 100 mg by mouth daily.    . Turmeric 500 MG CAPS Take by mouth.    Marland Kitchen VITAMIN K PO Take 150 mcg by mouth daily.     No current facility-administered medications on file prior to visit.     PAST MEDICAL HISTORY: Past Medical History:  Diagnosis Date  . Anxiety   . GERD (gastroesophageal reflux disease)   . Infertility, female   .  PCOS (polycystic ovarian syndrome)   . Pre-diabetes   . Vaginismus   . Vitamin D deficiency     PAST SURGICAL HISTORY: History reviewed. No pertinent surgical history.  SOCIAL HISTORY: Social History   Tobacco Use  . Smoking status: Never Smoker  . Smokeless tobacco: Never Used  Substance Use Topics  . Alcohol use: No  . Drug use: No    FAMILY HISTORY: Family History  Problem Relation Age of Onset  . High blood pressure Mother   . Thyroid disease Mother   . Obesity Mother   . AAA (abdominal aortic aneurysm) Father     ROS: Review of Systems  Constitutional: Positive for weight loss.  Gastrointestinal: Negative for nausea.  Genitourinary: Negative for frequency.  Endo/Heme/Allergies: Negative for  polydipsia.       Negative hypoglycemia  Psychiatric/Behavioral: Positive for depression. Negative for suicidal ideas.       + Anxiety    PHYSICAL EXAM: Blood pressure 122/78, pulse 71, temperature 98.3 F (36.8 C), temperature source Oral, height 5\' 1"  (1.549 m), weight 184 lb (83.5 kg), last menstrual period 03/03/2018, SpO2 98 %. Body mass index is 34.77 kg/m. Physical Exam Vitals signs reviewed.  Constitutional:      Appearance: Normal appearance. She is obese.  Cardiovascular:     Rate and Rhythm: Normal rate.  Pulmonary:     Effort: Pulmonary effort is normal.  Musculoskeletal: Normal range of motion.  Skin:    General: Skin is warm and dry.  Neurological:     Mental Status: She is alert and oriented to person, place, and time.  Psychiatric:        Mood and Affect: Mood normal.        Behavior: Behavior normal.     RECENT LABS AND TESTS: BMET    Component Value Date/Time   NA 141 12/23/2017 1249   K 4.2 12/23/2017 1249   CL 103 12/23/2017 1249   CO2 23 12/23/2017 1249   GLUCOSE 72 12/23/2017 1249   GLUCOSE 110 (H) 08/04/2013 2248   BUN 9 12/23/2017 1249   CREATININE 0.81 12/23/2017 1249   CALCIUM 9.2 12/23/2017 1249   GFRNONAA 98 12/23/2017 1249   GFRAA 113 12/23/2017 1249   Lab Results  Component Value Date   HGBA1C 5.6 12/23/2017   Lab Results  Component Value Date   INSULIN 13.4 12/23/2017   CBC    Component Value Date/Time   WBC 9.7 12/23/2017 1249   WBC 14.2 (H) 08/04/2013 2248   RBC 4.68 12/23/2017 1249   RBC 4.62 08/04/2013 2248   HGB 12.8 12/23/2017 1249   HCT 40.5 12/23/2017 1249   PLT 427 (H) 08/04/2013 2248   MCV 87 12/23/2017 1249   MCH 27.4 12/23/2017 1249   MCH 29.2 08/04/2013 2248   MCHC 31.6 12/23/2017 1249   MCHC 33.7 08/04/2013 2248   RDW 12.5 12/23/2017 1249   LYMPHSABS 2.6 12/23/2017 1249   MONOABS 0.8 08/04/2013 2239   EOSABS 0.1 12/23/2017 1249   BASOSABS 0.0 12/23/2017 1249   Iron/TIBC/Ferritin/ %Sat No results  found for: IRON, TIBC, FERRITIN, IRONPCTSAT Lipid Panel     Component Value Date/Time   CHOL 165 12/23/2017 1249   TRIG 54 12/23/2017 1249   HDL 40 12/23/2017 1249   LDLCALC 114 (H) 12/23/2017 1249   Hepatic Function Panel     Component Value Date/Time   PROT 7.0 12/23/2017 1249   ALBUMIN 3.9 12/23/2017 1249   AST 14 12/23/2017 1249   ALT  12 12/23/2017 1249   ALKPHOS 93 12/23/2017 1249   BILITOT 0.3 12/23/2017 1249   BILIDIR <0.10 06/12/2016 1309      Component Value Date/Time   TSH 1.290 12/23/2017 1249    ASSESSMENT AND PLAN: Prediabetes  Depression with anxiety - Plan: sertraline (ZOLOFT) 25 MG tablet  At risk for diabetes mellitus  Class 1 obesity with serious comorbidity and body mass index (BMI) of 34.0 to 34.9 in adult, unspecified obesity type  PLAN:  Pre-Diabetes Cynthia Figueroa will continue to work on weight loss, exercise, and decreasing simple carbohydrates in her diet to help decrease the risk of diabetes. We dicussed metformin including benefits and risks. She was informed that eating too many simple carbohydrates or too many calories at one sitting increases the likelihood of GI side effects. Oluwademilade declined metformin for now and a prescription was not written today. We will repeat labs at next visit. Emmali agrees to follow up with our clinic in 3 weeks as directed to monitor her progress.  Diabetes risk counselling Cynthia Figueroa was given extended (15 minutes) diabetes prevention counseling today. She is 32 y.o. female and has risk factors for diabetes including obesity and pre-diabetes. We discussed intensive lifestyle modifications today with an emphasis on weight loss as well as increasing exercise and decreasing simple carbohydrates in her diet.  Depression with GAD We discussed behavior modification techniques today to help Cynthia Figueroa deal with her depression and anxiety. Cynthia Figueroa agrees to continue taking Zoloft 25 mg PO daily #30 and we will refill for 1 month.  Cynthia Figueroa agrees to follow up with our clinic in 3 weeks.  Obesity Cynthia Figueroa is currently in the action stage of change. As such, her goal is to continue with weight loss efforts She has agreed to follow the Category 2 plan Cynthia Figueroa has been instructed to work up to a goal of 150 minutes of combined cardio and strengthening exercise per week for weight loss and overall health benefits. We discussed the following Behavioral Modification Strategies today: increasing lean protein intake, work on meal planning and easy cooking plans, holiday eating strategies, better snacking choices, and planning for success    Cynthia Figueroa has agreed to follow up with our clinic in 3 weeks. She was informed of the importance of frequent follow up visits to maximize her success with intensive lifestyle modifications for her multiple health conditions.   OBESITY BEHAVIORAL INTERVENTION VISIT  Today's visit was # 6   Starting weight: 183 lbs Starting date: 12/23/17 Today's weight : 184 lbs Today's date: 03/24/2018 Total lbs lost to date: 0    ASK: We discussed the diagnosis of obesity with Cynthia Figueroa today and Cynthia Figueroa agreed to give [place] permission to discuss obesity behavioral modification therapy today.  ASSESS: Cynthia Figueroa has the diagnosis of obesity and her BMI today is 34.78 Cynthia Figueroa is in the action stage of change   ADVISE: Cynthia Figueroa was educated on the multiple health risks of obesity as well as the benefit of weight loss to improve her health. She was advised of the need for long term treatment and the importance of lifestyle modifications to improve her current health and to decrease her risk of future health problems.  AGREE: Multiple dietary modification options and treatment options were discussed and  Cynthia Figueroa agreed to follow the recommendations documented in the above note.  ARRANGE: Makenleigh was educated on the importance of frequent visits to treat obesity as outlined per CMS and USPSTF guidelines  and agreed to schedule her next follow up appointment today.  I, Cynthia Figueroa, am acting as transcriptionist for Cynthia Qua, MD  I have reviewed the above documentation for accuracy and completeness, and I agree with the above. - Cynthia Qua, MD

## 2018-03-31 DIAGNOSIS — F430 Acute stress reaction: Secondary | ICD-10-CM | POA: Diagnosis not present

## 2018-04-19 ENCOUNTER — Other Ambulatory Visit (INDEPENDENT_AMBULATORY_CARE_PROVIDER_SITE_OTHER): Payer: Self-pay | Admitting: Family Medicine

## 2018-04-19 DIAGNOSIS — F418 Other specified anxiety disorders: Secondary | ICD-10-CM

## 2018-04-20 NOTE — Progress Notes (Signed)
Office: [phone]  /  Fax: [phone]    Date: April 21, 2018   Time Seen: 9:03am Duration: 31 minutes Provider: Lawerance CruelGaytri Barker, Psy.D. Type of Session: Individual Therapy  Type of Contact: Face-to-face  HPI: [name] was referred by Dr. Debbra RidingAlexandria Kadolph due to depression with emotional eating behaviors. She was seen for an initial appointment by this provider on March 24, 2018. Per the note for the visit with Dr. Debbra RidingAlexandria Kadolph on March 03, 2018, "Tyron Russellatashais struggling with emotional eating and using food for comfort to the extent that it is negatively impacting herhealth. Sheoften snacks when sheis not hungry. Natashasometimes feels sheis out of control and then feels guilty that shemade poor food choices. Lavetta NielsenShehas been working on behavior modification techniques to help reduce heremotional eating and has been somewhat successful.Sheshows no sign of suicidal or homicidal ideations. She was scheduled to see Dr. Dewaine CongerBarker today." Additionally, per the note for the initial visit with Dr. Debbra RidingAlexandria Kadolph on December 23, 2017, [name] started gaining weight when got married five years ago and her heaviest weight ever was 197 pounds. During the initial appointment with Dr. Debbra RidingAlexandria Kadolph, Marcelle SmilingNatasha reported experiencing the following: snacking frequently in the evenings, frequently drinking liquids with calories, frequently making poor food choices, frequently eating larger portions than normal  and struggling with emotional eating. She also described herself as a picky eater and she does not like to eat healthier foods. In addition, she skips meals frequently.   During the initial appointment with this provider, [name] reported she started observing she is in "emotional eater" since starting with this clinic.  She recalls engaging in emotional eating approximately 2 weeks ago, but could not recall additional details. Regarding overeating, [name] indicated she over ate pasta  this past Tuesday that was cooked by her stepdaughter as she did not want to hurt her stepdaughter's feelings. She shared she tends to crave sweets, including chocolate and cake. Moreover, [name] reported the onset of emotional eating was after she was married approximately 5 years ago. She noted nothing significant occurred at that time aside from getting married and having 3 stepchildren reside with her. [name] reported she began engaging in overeating a couple years ago. She denied a history of binge eating. [name] also denied history of purging and engagement in other compensatory strategies. She has never been diagnosed with an eating disorder. Furthermore, [name] was asked to complete a questionnaire assessing various behaviors related to emotional eating. [name] endorsed the following: overeat when you are celebrating, experience food cravings on a regular basis, eat certain foods when you are anxious, stressed, depressed, or your feelings are hurt, find food is comforting to you, overeat when you are angry or upset, overeat frequently when you are bored or lonely, not worry about what you eat when you are in a good mood, eat to help you stay awake and eat as a reward.  During today's appointment, [name] reported gaining three pounds since the last appointment with the clinic.    Session Content: Session focused on the following treatment goal: decrease emotional eating. The session was initiated with the administration of the PHQ-9 and GAD-7, as well as a brief check-in. [name] shared about recent events, including holidays. She discussed deviating from the prescribed meal plan during the holidays. Regarding getting back on track, [name] noted difficulty eating everything on the plan, especially dinner meals. Thus, she was engaged in problem solving. Regarding physical verus emotional hunger, [name] noticed experiencing emotional hunger on Christmas due to frustration. She  shared using the  handout to help cue her into hunger and fulness cues. Psychoeducation regarding triggers for emotional eating was provided. [name] was provided a handout, and encouraged to utilize the handout between now and the next appointment to increase awareness of triggers and frequency. Niyla agreed. This provider also discussed behavioral strategies for specific triggers, such as placing the utensil down when conversing to avoid mindless eating. Due to ongoing stressors at home, this provider discussed the option of a referral for longer-term therapeutic services. [name] requested a referral be placed. [name] was receptive to today's session as evidenced by openness to sharing, responsiveness to feedback, and willingness to identify triggers for emotional eating.  Mental Status Examination: [name] arrived on time for the appointment. She presented as appropriately dressed and groomed. [name] appeared her stated age and demonstrated adequate orientation to time, place, person, and purpose of the appointment. She also demonstrated appropriate eye contact. No psychomotor abnormalities or behavioral peculiarities noted. Her mood was euthymic with congruent affect. Her thought processes were logical, linear, and goal-directed. No hallucinations, delusions, bizarre thinking or behavior reported or observed. Judgment, insight, and impulse control appeared to be grossly intact. There was no evidence of paraphasias (i.e., errors in speech, gross mispronunciations, and word substitutions), repetition deficits, or disturbances in volume or prosody (i.e., rhythm and intonation). There was no evidence of attention or memory impairments. [name] denied current suicidal and homicidal ideation, intent or plan.  Structured Assessment Results: The Patient Health Questionnaire-9 (PHQ-9) is a self-report measure that assesses symptoms and severity of depression over the course of the last two weeks. [name] obtained a score of 5  suggesting mild depression. [name] finds the endorsed symptoms to be not difficult at all. Depression screen PHQ 2/9 04/21/2018  Decreased Interest 1  Down, Depressed, Hopeless 1  PHQ - 2 Score 2  Altered sleeping 0  Tired, decreased energy 1  Change in appetite 2  Feeling bad or failure about yourself  0  Trouble concentrating 0  Moving slowly or fidgety/restless 0  Suicidal thoughts 0  PHQ-9 Score 5  Difficult doing work/chores -   The Generalized Anxiety Disorder-7 (GAD-7) is a brief self-report measure that assesses symptoms of anxiety over the course of the last two weeks. [name] obtained a score of 4 suggesting minimal anxiety. GAD 7 : Generalized Anxiety Score 04/21/2018  Nervous, Anxious, on Edge 1  Control/stop worrying 0  Worry too much - different things 1  Trouble relaxing 1  Restless 0  Easily annoyed or irritable 1  Afraid - awful might happen 0  Total GAD 7 Score 4  Anxiety Difficulty Not difficult at all   Interventions:  Administration of PHQ-9 and GAD-7 for symptom monitoring Review of content from the previous session Empathic reflections and validation Problem solving Psychoeducation regarding triggers for emotional eating Discussed option for a referral for longer-term therapeutic services Rapport building Brief chart review  DSM-5 Diagnosis: 311 (F32.8) Other Specified Depressive Disorder, Emotional Eating Behaviors  Treatment Goal & Progress: During the initial appointment with this provider, the following treatment goal was established: decrease emotional eating. Progress is limited, as [name] has just begun treatment with this provider; however, she is receptive to the interaction and interventions and rapport is being established. Nevertheless, [name] has demonstrated progress in her goal as evidenced by increased awareness of hunger patterns and willingness to identify triggers for emotional eating.  Plan: [name] continues to appear able and  willing to participate as evidenced by engagement in reciprocal conversation,  and asking questions for clarification as appropriate. The next appointment with this provider will be scheduled in two weeks. The next session will focus on reviewing triggers for emotional eating, and the introduction of thought defusion. Moreover, a referral will be placed for longer-term therapeutic services to address ongoing stressors as well as vaginismus per Margan's request.

## 2018-04-21 ENCOUNTER — Ambulatory Visit (INDEPENDENT_AMBULATORY_CARE_PROVIDER_SITE_OTHER): Payer: 59 | Admitting: Psychology

## 2018-04-21 ENCOUNTER — Encounter (INDEPENDENT_AMBULATORY_CARE_PROVIDER_SITE_OTHER): Payer: Self-pay | Admitting: Family Medicine

## 2018-04-21 ENCOUNTER — Ambulatory Visit (INDEPENDENT_AMBULATORY_CARE_PROVIDER_SITE_OTHER): Payer: 59 | Admitting: Family Medicine

## 2018-04-21 VITALS — BP 114/80 | HR 73 | Temp 98.8°F | Ht 61.0 in | Wt 187.0 lb

## 2018-04-21 DIAGNOSIS — Z9189 Other specified personal risk factors, not elsewhere classified: Secondary | ICD-10-CM

## 2018-04-21 DIAGNOSIS — F3289 Other specified depressive episodes: Secondary | ICD-10-CM

## 2018-04-21 DIAGNOSIS — Z6835 Body mass index (BMI) 35.0-35.9, adult: Secondary | ICD-10-CM | POA: Diagnosis not present

## 2018-04-21 DIAGNOSIS — E8881 Metabolic syndrome: Secondary | ICD-10-CM | POA: Diagnosis not present

## 2018-04-21 MED ORDER — SERTRALINE HCL 25 MG PO TABS: 25.0000 mg | ORAL_TABLET | Freq: Every day | ORAL | 0 refills | Status: DC

## 2018-04-21 MED FILL — SERTRALINE HCL 25 MG TABLET: 25 | 30 days supply | Qty: 30 | Fill #0

## 2018-04-22 LAB — LIPID PANEL WITH LDL/HDL RATIO
Cholesterol, Total: 174 mg/dL (ref 100–199)
HDL: 38 mg/dL — ABNORMAL LOW (ref >39)
LDL Calculated: 121 mg/dL — ABNORMAL HIGH (ref 0–99)
LDl/HDL Ratio: 3.2 ratio (ref 0.0–3.2)
TRIGLYCERIDES: 73 mg/dL (ref 0–149)
VLDL Cholesterol Cal: 15 mg/dL (ref 5–40)

## 2018-04-22 LAB — HEMOGLOBIN A1C
ESTIMATED AVERAGE GLUCOSE: 114 mg/dL
Hgb A1c MFr Bld: 5.6 % (ref 4.8–5.6)

## 2018-04-22 LAB — INSULIN, RANDOM: INSULIN: 19.9 u[IU]/mL (ref 2.6–24.9)

## 2018-04-22 LAB — VITAMIN D 25 HYDROXY (VIT D DEFICIENCY, FRACTURES): Vit D, 25-Hydroxy: 27.1 ng/mL — ABNORMAL LOW (ref 30.0–100.0)

## 2018-04-22 NOTE — Progress Notes (Signed)
Office: [phone]  /  Fax: [phone]   HPI:   Chief Complaint: OBESITY Cynthia Figueroa is here to discuss her progress with her obesity treatment plan. She is on the Category 2 plan and is following her eating plan approximately 50 % of the time. She states she is exercising 0 minutes 0 times per week. Cynthia Figueroa had a lot of family celebrations between [place] and [place]. She states it was hard to stay on routine especially with the meal plan. She wants to do the Reuel BoomDaniel fast but wants to know options for following the plan.  Her weight is 187 lb (84.8 kg) today and has gained 3 pounds since her last visit. She has lost 0 lbs since starting treatment with us.  Insulin Resistance Cynthia Figueroa has a diagnosis of insulin resistance based on her elevated fasting insulin level >5. Although Marcile's blood glucose readings are still under good control, insulin resistance puts her at greater risk of metabolic syndrome and diabetes. She was previously on metformin, and denies GI side effect of metformin. She denies feelings of hypoglycemia. She continues to work on diet and exercise to decrease risk of diabetes.  Depression, Generalized Cynthia Figueroa notes symptoms are improving. Cynthia Figueroa struggles with emotional eating and using food for comfort to the extent that it is negatively impacting her health. She often snacks when she is not hungry. Cynthia Figueroa sometimes feels she is out of control and then feels guilty that she made poor food choices. She has been working on behavior modification techniques to help reduce her emotional eating and has been somewhat successful. She shows no sign of suicidal or homicidal ideations.  [facility] 04/21/2018 03/24/2018 12/23/2017  Decreased Interest 1 1 1   Down, Depressed, Hopeless 1 2 3   PHQ - 2 Score 2 3 4   Altered sleeping 0 0 2  Tired, decreased energy 1 1 3   Change in appetite 2 3 2   Feeling bad or failure about yourself  0 2 1  Trouble concentrating 0 2  3  Moving slowly or fidgety/restless 0 0 0  Suicidal thoughts 0 0 0  PHQ-9 Score 5 11 15   Difficult doing work/chores - - Somewhat difficult   At risk for osteopenia and osteoporosis Cynthia Figueroa is at higher risk of osteopenia and osteoporosis due to vitamin D deficiency.   ASSESSMENT AND PLAN:  Insulin resistance - Plan: Hemoglobin A1c, Insulin, random, Lipid Panel With LDL/HDL Ratio, VITAMIN D 25 Hydroxy (Vit-D Deficiency, Fractures)  Other depression - Generalized - Plan: sertraline (ZOLOFT) 25 MG tablet  At risk for osteoporosis  Class 2 severe obesity with serious comorbidity and body mass index (BMI) of 35.0 to 35.9 in adult, unspecified obesity type (HCC)  PLAN:  Insulin Resistance Cynthia Figueroa will continue to work on weight loss, exercise, and decreasing simple carbohydrates in her diet to help decrease the risk of diabetes. We dicussed metformin including benefits and risks. She was informed that eating too many simple carbohydrates or too many calories at one sitting increases the likelihood of GI side effects. We will repeat labs today. Cynthia Figueroa agrees to follow up with our clinic in 2 weeks as directed to monitor her progress.  Depression, Generalized We discussed behavior modification techniques today to help Cynthia Figueroa deal with her emotional eating and depression. Cynthia Figueroa agrees to continue taking Zoloft 25 mg PO daily #30 and we will refill for 1 month. Cynthia Figueroa agrees to follow up with our clinic in 2 weeks.  At risk for osteopenia and osteoporosis Cynthia Figueroa was  given extended (15 minutes) osteoporosis prevention counseling today. Cynthia Figueroa is at risk for osteopenia and osteoporsis due to her vitamin D deficiency. She was encouraged to take her vitamin D and follow her higher calcium diet and increase strengthening exercise to help strengthen her bones and decrease her risk of osteopenia and osteoporosis.  Obesity Cynthia Figueroa is currently in the action stage of change. As such, her goal  is to continue with weight loss efforts She has agreed to follow a lower carbohydrate, vegetable and lean protein rich diet plan Cynthia Figueroa has been instructed to work up to a goal of 150 minutes of combined cardio and strengthening exercise per week or start physical activity for 15-30 minutes 2-3 times per week for weight loss and overall health benefits. We discussed the following Behavioral Modification Strategies today: increasing lean protein intake, increasing vegetables, work on meal planning and easy cooking plans, and planning for success We discussed various medication options to help Cynthia Figueroa with her weight loss efforts and we both agreed to increase Saxenda to 0.9 mg SubQ daily.  Cynthia Figueroa has agreed to follow up with our clinic in 2 weeks. She was informed of the importance of frequent follow up visits to maximize her success with intensive lifestyle modifications for her multiple health conditions.  ALLERGIES: Allergies  Allergen Reactions  . Lamisil [Terbinafine] Hives    MEDICATIONS: Current Outpatient Medications on File Prior to Visit  Medication Sig Dispense Refill  . ASHWAGANDHA PO Take 300 mg by mouth daily.    . Cholecalciferol (VITAMIN D3) 2000 units CHEW Chew by mouth.    . Cranberry 500 MG CAPS Take by mouth.    . IRON PO Take 1 tablet by mouth daily.    Marland Kitchen. itraconazole (SPORANOX) 100 MG capsule Take 1 capsule (100 mg total) by mouth daily. 90 capsule 0  . Liraglutide -Weight Management (SAXENDA) 18 MG/3ML SOPN Inject 0.6 mg into the skin daily. 1 pen 0  . MAGNESIUM PO Take 1 tablet by mouth daily.    . metFORMIN (GLUCOPHAGE) 500 MG tablet Take 1 tablet (500 mg total) by mouth daily with breakfast. 30 tablet 0  . Multiple Vitamins-Minerals (MULTIVITAMIN PO) Take by mouth.    . NON FORMULARY Lemon balm herb  330 mg /mL qd prn    . Probiotic Product (PROBIOTIC DAILY PO) Take by mouth.    . progesterone (PROMETRIUM) 100 MG capsule Take 100 mg by mouth daily.    .  Turmeric 500 MG CAPS Take by mouth.    Marland Kitchen. VITAMIN K PO Take 150 mcg by mouth daily.     No current facility-administered medications on file prior to visit.     PAST MEDICAL HISTORY: Past Medical History:  Diagnosis Date  . Anxiety   . GERD (gastroesophageal reflux disease)   . Infertility, female   . PCOS (polycystic ovarian syndrome)   . Pre-diabetes   . Vaginismus   . Vitamin D deficiency     PAST SURGICAL HISTORY: History reviewed. No pertinent surgical history.  SOCIAL HISTORY: Social History   Tobacco Use  . Smoking status: Never Smoker  . Smokeless tobacco: Never Used  Substance Use Topics  . Alcohol use: No  . Drug use: No    FAMILY HISTORY: Family History  Problem Relation Age of Onset  . High blood pressure Mother   . Thyroid disease Mother   . Obesity Mother   . AAA (abdominal aortic aneurysm) Father     ROS: Review of Systems  Constitutional: Negative  for weight loss.  Endo/Heme/Allergies:       Negative hypoglycemia  Psychiatric/Behavioral: Positive for depression. Negative for suicidal ideas.    PHYSICAL EXAM: Blood pressure 114/80, pulse 73, temperature 98.8 F (37.1 C), temperature source Oral, height 5\' 1"  (1.549 m), weight 187 lb (84.8 kg), last menstrual period 03/31/2018, SpO2 100 %. Body mass index is 35.33 kg/m. Physical Exam Vitals signs reviewed.  Constitutional:      Appearance: Normal appearance. She is obese.  Cardiovascular:     Rate and Rhythm: Normal rate.     Pulses: Normal pulses.  Pulmonary:     Effort: Pulmonary effort is normal.     Breath sounds: Normal breath sounds.  Musculoskeletal: Normal range of motion.  Skin:    General: Skin is warm and dry.  Neurological:     Mental Status: She is alert and oriented to person, place, and time.  Psychiatric:        Mood and Affect: Mood normal.        Behavior: Behavior normal.     RECENT LABS AND TESTS: BMET    Component Value Date/Time   NA 141 12/23/2017 1249     K 4.2 12/23/2017 1249   CL 103 12/23/2017 1249   CO2 23 12/23/2017 1249   GLUCOSE 72 12/23/2017 1249   GLUCOSE 110 (H) 08/04/2013 2248   BUN 9 12/23/2017 1249   CREATININE 0.81 12/23/2017 1249   CALCIUM 9.2 12/23/2017 1249   GFRNONAA 98 12/23/2017 1249   GFRAA 113 12/23/2017 1249   Lab Results  Component Value Date   HGBA1C 5.6 12/23/2017   Lab Results  Component Value Date   INSULIN 13.4 12/23/2017   CBC    Component Value Date/Time   WBC 9.7 12/23/2017 1249   WBC 14.2 (H) 08/04/2013 2248   RBC 4.68 12/23/2017 1249   RBC 4.62 08/04/2013 2248   HGB 12.8 12/23/2017 1249   HCT 40.5 12/23/2017 1249   PLT 427 (H) 08/04/2013 2248   MCV 87 12/23/2017 1249   MCH 27.4 12/23/2017 1249   MCH 29.2 08/04/2013 2248   MCHC 31.6 12/23/2017 1249   MCHC 33.7 08/04/2013 2248   RDW 12.5 12/23/2017 1249   LYMPHSABS 2.6 12/23/2017 1249   MONOABS 0.8 08/04/2013 2239   EOSABS 0.1 12/23/2017 1249   BASOSABS 0.0 12/23/2017 1249   Iron/TIBC/Ferritin/ %Sat No results found for: IRON, TIBC, FERRITIN, IRONPCTSAT Lipid Panel     Component Value Date/Time   CHOL 165 12/23/2017 1249   TRIG 54 12/23/2017 1249   HDL 40 12/23/2017 1249   LDLCALC 114 (H) 12/23/2017 1249   Hepatic Function Panel     Component Value Date/Time   PROT 7.0 12/23/2017 1249   ALBUMIN 3.9 12/23/2017 1249   AST 14 12/23/2017 1249   ALT 12 12/23/2017 1249   ALKPHOS 93 12/23/2017 1249   BILITOT 0.3 12/23/2017 1249   BILIDIR <0.10 06/12/2016 1309      Component Value Date/Time   TSH 1.290 12/23/2017 1249      OBESITY BEHAVIORAL INTERVENTION VISIT  Today's visit was # 7   Starting weight: 183 lbs Starting date: 12/23/17 Today's weight : 187 lbs  Today's date: 04/21/2018 Total lbs lost to date: 0    ASK: We discussed the diagnosis of obesity with [name] today and Cle[place]is agreed to give [place] permission to discuss obesity behavioral modification therapy today.  ASSESS: Andrea has the diagnosis  of obesity and her BMI today is 35.35 Esmerelda is in  the action stage of change   ADVISE: Merle was educated on the multiple health risks of obesity as well as the benefit of weight loss to improve her health. She was advised of the need for long term treatment and the importance of lifestyle modifications to improve her current health and to decrease her risk of future health problems.  AGREE: Multiple dietary modification options and treatment options were discussed and  Shama agreed to follow the recommendations documented in the above note.  ARRANGE: Ellee was educated on the importance of frequent visits to treat obesity as outlined per CMS and USPSTF guidelines and agreed to schedule her next follow up appointment today.  I, Burt Knack, am acting as transcriptionist for Debbra Riding, MD  I have reviewed the above documentation for accuracy and completeness, and I agree with the above. - Debbra Riding, MD

## 2018-04-28 DIAGNOSIS — F430 Acute stress reaction: Secondary | ICD-10-CM | POA: Diagnosis not present

## 2018-05-02 NOTE — Progress Notes (Signed)
Office: [phone]  /  Fax: [phone]    Date: May 09, 2018   Time Seen: 2:18pm Duration: 26 minutes Provider: Lawerance CruelGaytri Barker, Psy.D. Type of Session: Individual Therapy  Type of Contact: Face-to-face  Session Content: Cynthia Figueroa is a 33 year old female presenting for a follow-up appointment to work towards the previously established treatment goal of decreasing emotional eating. The session was initiated with the administration of the PHQ-9 and GAD-7, as well as a brief check-in. Cynthia Figueroa indicated she switched to the "low carb plan" and lost seven pounds. Regarding emotional eating, Cynthia Figueroa indicated engaging in emotional eating when bored at work; however, she indicated choosing snacks from her plan. Cynthia Figueroa also discussed engaging in meal preparation and using a grocery service. Regarding triggers for emotional eating, Cynthia Figueroa identified stress in addition to boredom. Psychoeducation regarding thought defusion, including its impact on emotional eating was provided. Cynthia Figueroa was led through a thought defusion exercise, and a handout with various exercises was provided. Cynthia Figueroa was encouraged to engage in the thought defusion exercises between now and the next appointment with this provider. Cynthia Figueroa agreed. For the exercise, she utilized the thought, "I am not good enough." Following the exercise, Cynthia Figueroa reported, "Initially I started questioning it" and described feeling uncomfortable. As the exercise progressed, Cynthia Figueroa noted feeling sad. Thus, she was led through a couple additional thought defusion exercises. Following the last exercise, she was observed laughing, and she indicated she no longer experienced the sadness she initially felt.This provider checked-in regarding the referral previously placed. She reported she received the call while she was at work, and noted a plan to call back to schedule an appointment. Cynthia Figueroa also shared she will be interviewing for a position with this  provider's clinic. Thus, this provider discussed the concerns of a dual relationship should she gain employment with the clinic and was still meeting with this provider. Cynthia Figueroa expressed understanding and noted she would prefer working with the therapist for which the referral was placed. However, at this time, she expressed desire to continue meeting with this provider. Cynthia Figueroa was receptive to today's session as evidenced by openness to sharing, responsiveness to feedback, and engagement in the thought defusion exercise.  Mental Status Examination: Cynthia Figueroa arrived on time for the appointment. She presented as appropriately dressed and groomed. Cynthia Figueroa appeared her stated age and demonstrated adequate orientation to time, place, person, and purpose of the appointment. She also demonstrated appropriate eye contact. No psychomotor abnormalities or behavioral peculiarities noted. Her mood was euthymic with congruent affect. Her thought processes were logical, linear, and goal-directed. No hallucinations, delusions, bizarre thinking or behavior reported or observed. Judgment, insight, and impulse control appeared to be grossly intact. There was no evidence of paraphasias (i.e., errors in speech, gross mispronunciations, and word substitutions), repetition deficits, or disturbances in volume or prosody (i.e., rhythm and intonation). There was no evidence of attention or memory impairments. Cynthia Figueroa denied current suicidal and homicidal ideation, plan and intent.   Structured Assessment Results: The Patient Health Questionnaire-9 (PHQ-9) is a self-report measure that assesses symptoms and severity of depression over the course of the last two weeks. Cynthia Figueroa obtained a score of 4 suggesting minimal depression. Cynthia Figueroa finds the endorsed symptoms to be not difficult at all. [facility] screen [facility] 05/09/2018  Decreased Interest 0  Down, Depressed, Hopeless 1  PHQ - 2 Score 1  Altered sleeping 0  Tired, decreased  energy 1  Change in appetite 0  Feeling bad or failure about yourself  1  Trouble concentrating 1  Moving slowly or fidgety/restless 0  Suicidal thoughts 0  PHQ-9 Score 4  Difficult doing work/chores -   The Generalized Anxiety Disorder-7 (GAD-7) is a brief self-report measure that assesses symptoms of anxiety over the course of the last two weeks. Cynthia Figueroa obtained a score of 5 suggesting mild anxiety. GAD 7 : Generalized Anxiety Score 05/09/2018  Nervous, Anxious, on Edge 1  Control/stop worrying 1  Worry too much - different things 1  Trouble relaxing 1  Restless 0  Easily annoyed or irritable 1  Afraid - awful might happen 0  Total GAD 7 Score 5  Anxiety Difficulty Not difficult at all   Interventions:  Administration of PHQ-9 and GAD-7 for symptom monitoring Review of content from the previous session Empathic reflections and validation Psychoeducation regarding thought defusion Thought defusion exercise Positive reinforcement Brief chart review  DSM-5 Diagnosis: 311 (F32.8) Other Specified Depressive Disorder, Emotional Eating Behaviors  Treatment Goal & Progress: During the initial appointment with this provider, the following treatment goal was established: decrease emotional eating. Cynthia Figueroa has demonstrated progress in her goal as evidenced by increased awareness of hunger patterns, and triggers for emotional eating. She demonstrated willingness to engage in thought defusion.   Plan: Cynthia Figueroa continues to appear able and willing to participate as evidenced by engagement in reciprocal conversation, and asking questions for clarification as appropriate. The next appointment will be scheduled in two weeks. The next session will focus on reviewing thought defusion and the introduction of mindfulness.

## 2018-05-09 ENCOUNTER — Ambulatory Visit (INDEPENDENT_AMBULATORY_CARE_PROVIDER_SITE_OTHER): Payer: 59 | Admitting: Family Medicine

## 2018-05-09 ENCOUNTER — Ambulatory Visit (INDEPENDENT_AMBULATORY_CARE_PROVIDER_SITE_OTHER): Payer: 59 | Admitting: Psychology

## 2018-05-09 VITALS — BP 134/82 | HR 85 | Temp 98.0°F | Ht 61.0 in | Wt 180.0 lb

## 2018-05-09 DIAGNOSIS — E559 Vitamin D deficiency, unspecified: Secondary | ICD-10-CM | POA: Diagnosis not present

## 2018-05-09 DIAGNOSIS — Z6834 Body mass index (BMI) 34.0-34.9, adult: Secondary | ICD-10-CM | POA: Diagnosis not present

## 2018-05-09 DIAGNOSIS — Z9189 Other specified personal risk factors, not elsewhere classified: Secondary | ICD-10-CM

## 2018-05-09 DIAGNOSIS — E669 Obesity, unspecified: Secondary | ICD-10-CM | POA: Diagnosis not present

## 2018-05-09 DIAGNOSIS — F3289 Other specified depressive episodes: Secondary | ICD-10-CM | POA: Diagnosis not present

## 2018-05-09 DIAGNOSIS — F411 Generalized anxiety disorder: Secondary | ICD-10-CM | POA: Diagnosis not present

## 2018-05-09 DIAGNOSIS — E8881 Metabolic syndrome: Secondary | ICD-10-CM | POA: Diagnosis not present

## 2018-05-09 MED ORDER — SERTRALINE HCL 50 MG PO TABS: 50.0000 mg | ORAL_TABLET | Freq: Every day | ORAL | 0 refills | Status: DC

## 2018-05-09 MED ORDER — VITAMIN D (ERGOCALCIFEROL) 1.25 MG (50000 UNIT) PO CAPS: 50000.0000 [IU] | ORAL_CAPSULE | ORAL | 0 refills | Status: DC

## 2018-05-09 MED FILL — SERTRALINE HCL 50 MG TABLET: 50 | 30 days supply | Qty: 30 | Fill #0

## 2018-05-09 MED FILL — VIT D2 1.25 MG (50,000 UNIT: 1.25 MG | 28 days supply | Qty: 4 | Fill #0

## 2018-05-10 NOTE — Progress Notes (Signed)
Office: [phone]  /  Fax: [phone]   HPI:   Chief Complaint: OBESITY Cynthia Figueroa is here to discuss her progress with her obesity treatment plan. She is on the lower carbohydrate, vegetable and lean protein rich diet plan and is following her eating plan approximately 99 % of the time. She states she is doing T25 for 25 minutes 2-3 times per week. Cynthia Figueroa is liking the low carbohydrate plan, she finds it easier to follow. She is enjoying eating whenever she wants. she has no upcoming celebrations except her husband's birthday and Valentine's Day.  Her weight is 180 lb (81.6 kg) today and has had a weight loss of 7 pounds over a period of 2 to 3 weeks since her last visit. She has lost 3 lbs since starting treatment with [place].  Vitamin D Deficiency Cynthia Figueroa has a diagnosis of vitamin D deficiency. She is currently taking prescription Vit D. She notes fatigue and denies nausea, vomiting or muscle weakness.  At risk for osteopenia and osteoporosis Cynthia Figueroa is at higher risk of osteopenia and osteoporosis due to vitamin D deficiency.   Insulin Resistance [name] has a diagnosis of insulin resistance based on her slightly elevated fasting insulin level >5. Although [name]'s blood glucose readings are still under good control, insulin resistance puts her at greater risk of metabolic syndrome and diabetes. She is taking metformin currently and is doing better with carbohydrate cravings. She continues to work on diet and exercise to decrease risk of diabetes.  Generalized Anxiety Disorder [name] has GAD, and she notes her symptoms have significantly improved but she is still struggling with irritability.  ASSESSMENT AND PLAN:  Insulin resistance  GAD (generalized anxiety disorder) - Plan: sertraline (ZOLOFT) 50 MG tablet  Vitamin D deficiency - Plan: Vitamin D, Ergocalciferol, (DRISDOL) 1.25 MG (50000 UT) CAPS capsule  At risk for osteoporosis  Class 1 obesity with serious comorbidity  and body mass index (BMI) of 34.0 to 34.9 in adult, unspecified obesity type  PLAN:  Vitamin D Deficiency Cynthia Figueroa was informed that low vitamin D levels contributes to fatigue and are associated with obesity, breast, and colon cancer. Cynthia Figueroa agrees to continue taking prescription Vit D @50 ,000 IU every week #4 and we will refill for 1 month. She will follow up for routine testing of vitamin D, at least 2-3 times per year. She was informed of the risk of over-replacement of vitamin D and agrees to not increase her dose unless she discusses this with [place] first. Cynthia Figueroa agrees to follow up with our clinic in 2 weeks.  At risk for osteopenia and osteoporosis Cynthia Figueroa was given extended (15 minutes) osteoporosis prevention counseling today. Cynthia Figueroa is at risk for osteopenia and osteoporsis due to her vitamin D deficiency. She was encouraged to take her vitamin D and follow her higher calcium diet and increase strengthening exercise to help strengthen her bones and decrease her risk of osteopenia and osteoporosis.  Insulin Resistance Cynthia Figueroa will continue to work on weight loss, exercise, and decreasing simple carbohydrates in her diet to help decrease the risk of diabetes. We dicussed metformin including benefits and risks. She was informed that eating too many simple carbohydrates or too many calories at one sitting increases the likelihood of GI side effects. Cynthia Figueroa agrees to continue taking metformin and we will repeat labs in 3 months. Cynthia Figueroa agrees to follow up with our clinic in 2 weeks as directed to monitor her progress.  Generalized Anxiety Disorder [name] agrees to increase Zoloft to 50 mg PO daily #  30 with no refills (needs 50 mg pills). Cynthia Figueroa agrees to follow up with our clinic in 2 weeks.  Obesity Cynthia Figueroa is currently in the action stage of change. As such, her goal is to continue with weight loss efforts She has agreed to follow a lower carbohydrate, vegetable and lean protein rich  diet plan Cynthia Figueroa has been instructed to work up to a goal of 150 minutes of combined cardio and strengthening exercise per week for weight loss and overall health benefits. We discussed the following Behavioral Modification Strategies today: increasing lean protein intake, increasing vegetables and work on meal planning and easy cooking plans, and planning for success   Cynthia Figueroa has agreed to follow up with our clinic in 2 weeks. She was informed of the importance of frequent follow up visits to maximize her success with intensive lifestyle modifications for her multiple health conditions.  ALLERGIES: Allergies  Allergen Reactions  . Lamisil [Terbinafine] Hives    MEDICATIONS: Current Outpatient Medications on File Prior to Visit  Medication Sig Dispense Refill  . ASHWAGANDHA PO Take 300 mg by mouth daily.    . Cholecalciferol (VITAMIN D3) 2000 units CHEW Chew by mouth.    . Cranberry 500 MG CAPS Take by mouth.    . IRON PO Take 1 tablet by mouth daily.    Marland Kitchen. itraconazole (SPORANOX) 100 MG capsule Take 1 capsule (100 mg total) by mouth daily. 90 capsule 0  . Liraglutide -Weight Management (SAXENDA) 18 MG/3ML SOPN Inject 0.6 mg into the skin daily. 1 pen 0  . MAGNESIUM PO Take 1 tablet by mouth daily.    . metFORMIN (GLUCOPHAGE) 500 MG tablet Take 1 tablet (500 mg total) by mouth daily with breakfast. 30 tablet 0  . Multiple Vitamins-Minerals (MULTIVITAMIN PO) Take by mouth.    . NON FORMULARY Lemon balm herb  330 mg /mL qd prn    . Probiotic Product (PROBIOTIC DAILY PO) Take by mouth.    . progesterone (PROMETRIUM) 100 MG capsule Take 100 mg by mouth daily.    . Turmeric 500 MG CAPS Take by mouth.    Marland Kitchen. VITAMIN K PO Take 150 mcg by mouth daily.     No current facility-administered medications on file prior to visit.     PAST MEDICAL HISTORY: Past Medical History:  Diagnosis Date  . Anxiety   . GERD (gastroesophageal reflux disease)   . Infertility, female   . PCOS (polycystic  ovarian syndrome)   . Pre-diabetes   . Vaginismus   . Vitamin D deficiency     PAST SURGICAL HISTORY: No past surgical history on file.  SOCIAL HISTORY: Social History   Tobacco Use  . Smoking status: Never Smoker  . Smokeless tobacco: Never Used  Substance Use Topics  . Alcohol use: No  . Drug use: No    FAMILY HISTORY: Family History  Problem Relation Age of Onset  . High blood pressure Mother   . Thyroid disease Mother   . Obesity Mother   . AAA (abdominal aortic aneurysm) Father     ROS: Review of Systems  Constitutional: Positive for malaise/fatigue and weight loss.  Gastrointestinal: Negative for nausea and vomiting.  Musculoskeletal:       Negative muscle weakness  Psychiatric/Behavioral:       + Anxiety    PHYSICAL EXAM: Blood pressure 134/82, pulse 85, temperature 98 F (36.7 C), temperature source Oral, height 5\' 1"  (1.549 m), weight 180 lb (81.6 kg), SpO2 96 %. Body mass index is  34.01 kg/m. Physical Exam Vitals signs reviewed.  Constitutional:      Appearance: Normal appearance. She is obese.  Cardiovascular:     Rate and Rhythm: Normal rate.     Pulses: Normal pulses.  Pulmonary:     Effort: Pulmonary effort is normal.     Breath sounds: Normal breath sounds.  Musculoskeletal: Normal range of motion.  Skin:    General: Skin is warm and dry.  Neurological:     Mental Status: She is alert and oriented to person, place, and time.  Psychiatric:        Mood and Affect: Mood normal.        Behavior: Behavior normal.     RECENT LABS AND TESTS: BMET    Component Value Date/Time   NA 141 12/23/2017 1249   K 4.2 12/23/2017 1249   CL 103 12/23/2017 1249   CO2 23 12/23/2017 1249   GLUCOSE 72 12/23/2017 1249   GLUCOSE 110 (H) 08/04/2013 2248   BUN 9 12/23/2017 1249   CREATININE 0.81 12/23/2017 1249   CALCIUM 9.2 12/23/2017 1249   GFRNONAA 98 12/23/2017 1249   GFRAA 113 12/23/2017 1249   Lab Results  Component Value Date   HGBA1C 5.6  04/21/2018   HGBA1C 5.6 12/23/2017   Lab Results  Component Value Date   INSULIN 19.9 04/21/2018   INSULIN 13.4 12/23/2017   CBC    Component Value Date/Time   WBC 9.7 12/23/2017 1249   WBC 14.2 (H) 08/04/2013 2248   RBC 4.68 12/23/2017 1249   RBC 4.62 08/04/2013 2248   HGB 12.8 12/23/2017 1249   HCT 40.5 12/23/2017 1249   PLT 427 (H) 08/04/2013 2248   MCV 87 12/23/2017 1249   MCH 27.4 12/23/2017 1249   MCH 29.2 08/04/2013 2248   MCHC 31.6 12/23/2017 1249   MCHC 33.7 08/04/2013 2248   RDW 12.5 12/23/2017 1249   LYMPHSABS 2.6 12/23/2017 1249   MONOABS 0.8 08/04/2013 2239   EOSABS 0.1 12/23/2017 1249   BASOSABS 0.0 12/23/2017 1249   Iron/TIBC/Ferritin/ %Sat No results found for: IRON, TIBC, FERRITIN, IRONPCTSAT Lipid Panel     Component Value Date/Time   CHOL 174 04/21/2018 0856   TRIG 73 04/21/2018 0856   HDL 38 (L) 04/21/2018 0856   LDLCALC 121 (H) 04/21/2018 0856   Hepatic Function Panel     Component Value Date/Time   PROT 7.0 12/23/2017 1249   ALBUMIN 3.9 12/23/2017 1249   AST 14 12/23/2017 1249   ALT 12 12/23/2017 1249   ALKPHOS 93 12/23/2017 1249   BILITOT 0.3 12/23/2017 1249   BILIDIR <0.10 06/12/2016 1309      Component Value Date/Time   TSH 1.290 12/23/2017 1249      OBESITY BEHAVIORAL INTERVENTION VISIT  Today's visit was # 8   Starting weight: 183 lbs Starting date: 12/23/17 Today's weight : 180 lbs  Today's date: 05/09/2018 Total lbs lost to date: 3    ASK: We discussed the diagnosis of obesity with Michaelle CopasNatasha Bales today and Cynthia Figueroa agreed to give us permission to discuss obesity behavioral modification therapy today.  ASSESS: Cynthia Figueroa has the diagnosis of obesity and her BMI today is 34.03 Cynthia Figueroa is in the action stage of change   ADVISE: Cynthia Figueroa was educated on the multiple health risks of obesity as well as the benefit of weight loss to improve her health. She was advised of the need for long term treatment and the importance of  lifestyle modifications to improve her current health  and to decrease her risk of future health problems.  AGREE: Multiple dietary modification options and treatment options were discussed and  Kapria agreed to follow the recommendations documented in the above note.  ARRANGE: Katlen was educated on the importance of frequent visits to treat obesity as outlined per CMS and USPSTF guidelines and agreed to schedule her next follow up appointment today.  I, Burt Knack, am acting as transcriptionist for Debbra Riding, MD  I have reviewed the above documentation for accuracy and completeness, and I agree with the above. - Debbra Riding, MD

## 2018-05-26 ENCOUNTER — Ambulatory Visit (INDEPENDENT_AMBULATORY_CARE_PROVIDER_SITE_OTHER): Payer: 59 | Admitting: Family Medicine

## 2018-05-26 ENCOUNTER — Encounter (INDEPENDENT_AMBULATORY_CARE_PROVIDER_SITE_OTHER): Payer: Self-pay | Admitting: Family Medicine

## 2018-05-26 ENCOUNTER — Ambulatory Visit (INDEPENDENT_AMBULATORY_CARE_PROVIDER_SITE_OTHER): Payer: 59 | Admitting: Psychology

## 2018-05-26 VITALS — BP 122/80 | HR 100 | Temp 97.9°F | Ht 61.0 in | Wt 179.0 lb

## 2018-05-26 DIAGNOSIS — F3289 Other specified depressive episodes: Secondary | ICD-10-CM | POA: Diagnosis not present

## 2018-05-26 DIAGNOSIS — E669 Obesity, unspecified: Secondary | ICD-10-CM | POA: Diagnosis not present

## 2018-05-26 DIAGNOSIS — F32A Depression, unspecified: Secondary | ICD-10-CM

## 2018-05-26 DIAGNOSIS — Z9189 Other specified personal risk factors, not elsewhere classified: Secondary | ICD-10-CM | POA: Diagnosis not present

## 2018-05-26 DIAGNOSIS — Z6833 Body mass index (BMI) 33.0-33.9, adult: Secondary | ICD-10-CM

## 2018-05-26 DIAGNOSIS — E559 Vitamin D deficiency, unspecified: Secondary | ICD-10-CM

## 2018-05-26 DIAGNOSIS — F329 Major depressive disorder, single episode, unspecified: Secondary | ICD-10-CM | POA: Diagnosis not present

## 2018-05-26 MED ORDER — VITAMIN D (ERGOCALCIFEROL) 1.25 MG (50000 UNIT) PO CAPS: 50000.0000 [IU] | ORAL_CAPSULE | ORAL | 0 refills | Status: DC

## 2018-05-26 NOTE — Progress Notes (Signed)
Office: [phone]  /  Fax: [phone]    Date:May 26, 2018   Time Seen: 8:15am Duration: 27 minutes Provider: Lawerance Cruel, Psy.D. Type of Session: Individual Therapy  Type of Contact: Face-to-face  Session Content: Cynthia Figueroa is a 33 y.o. female presenting for a follow-up appointment to address the previously established treatment goal of decreasing emotional eating. The session was initiated with the administration of the PHQ-9 and GAD-7, as well as a brief check-in. Jerriyah dicussed a "couple occassions" where she ate [employer] cookies and bread. She also discussed missing dinner a few days due to it "being too late." [place] described experiencing cravings prior to leaving work, which resulted in her consuming foods outside of the meal plan. This was explored further, and it was determined she was experiencing physical hunger as multiple hours passed since her last meal. She described experiencing difficulty packing snacks on the prescribed low-carb plan. As such, an additional handout for the plan was provided and Devion was engaged in problem solving. For instance, this provider and Demetric discussed making cowboy caviar with certain ingredients that were congruent to her meal plan while avoiding other ingredients (e.g., avocado, oil, corn). Moreover, this provider and [place] discussed her skipping meals in the evening. She explained it is often due to lack of meal prep and having to be at church late.  As such, this provider and Boneta engaged in further problem solving to determine meals that she could make ahead of time and also take with her to church.   Additionally, Joyell explained she endorsed various items on the PHQ-9 and GAD-7 secondary to her making a decision about switching jobs. Regarding thought defusion, Arrion discussed that the exercises "really helped [her] make [her] decision" to stay at her current job.  She was provided a handout for an additional  thought  defusion exercise, "Leaves on a Stream." Furthermore, This provider discussed termination planning. Regarding the referral placed for longer-term therapeutic services, [place]e indicated she called back; however, she acknowledged she did not leave a voicemail. She noted a plan to call today and leave a voicemail if deemed necessary. Jacquelene was receptive to today's session as evidenced by openness to sharing, responsiveness to feedback, and engagement in problem solving.  Mental Status Examination: Cynthia Figueroa arrived early for the appointment. She presented as appropriately dressed and groomed. Cynthia Figueroa appeared her stated age and demonstrated adequate orientation to time, place, person, and purpose of the appointment. She also demonstrated appropriate eye contact. No psychomotor abnormalities or behavioral peculiarities noted. Her mood was euthymic with congruent affect. Her thought processes were logical, linear, and goal-directed. No hallucinations, delusions, bizarre thinking or behavior reported or observed. Judgment, insight, and impulse control appeared to be grossly intact. There was no evidence of paraphasias (i.e., errors in speech, gross mispronunciations, and word substitutions), repetition deficits, or disturbances in volume or prosody (i.e., rhythm and intonation). There was no evidence of attention or memory impairments. Cynthia Figueroa denied current suicidal and homicidal ideation, plan and intent.   Structured Assessment Results: The Patient Health Questionnaire-9 (PHQ-9) is a self-report measure that assesses symptoms and severity of depression over the course of the last two weeks. Tanganika obtained a score of 4 suggesting minimal depression. Lovina finds the endorsed symptoms to be not difficult at all. Depression screen [facility] 2/9 05/26/2018  Decreased Interest 0  Down, Depressed, Hopeless 1  PHQ - 2 Score 1  Altered sleeping 1  Tired, decreased energy 1  Change in appetite 1  Feeling bad or failure  about yourself  0  Trouble concentrating 0  Moving slowly or fidgety/restless 0  Suicidal thoughts 0  PHQ-9 Score 4  Difficult doing work/chores -   The Generalized Anxiety Disorder-7 (GAD-7) is a brief self-report measure that assesses symptoms of anxiety over the course of the last two weeks. Marinel obtained a score of 5 suggesting mild anxiety. GAD 7 : Generalized Anxiety Score 05/26/2018  Nervous, Anxious, on Edge 1  Control/stop worrying 1  Worry too much - different things 1  Trouble relaxing 1  Restless 1  Easily annoyed or irritable 0  Afraid - awful might happen 0  Total GAD 7 Score 5  Anxiety Difficulty Not difficult at all   Interventions:  Administration of PHQ-9 and GAD-7 for symptom monitoring Review of content from the previous session Empathic reflections and validation Problem solving Termination planning Positive reinforcement Brief chart review  DSM-5 Diagnosis: 311 (F32.8) Other Specified Depressive Disorder, Emotional Eating Behaviors  Treatment Goal & Progress: During the initial appointment with this provider, the following treatment goal was established: decrease emotional eating. Shaundrea has demonstrated progress in her goal as evidenced by increased awareness of hunger patterns and triggers for emotional eating. She continues to demonstrate willingness to engage in learned skills.  Plan: Jacquiline continues to appear able and willing to participate as evidenced by engagement in reciprocal conversation, and asking questions for clarification as appropriate. The next appointment will be scheduled in two weeks. The next session will focus on the introduction of mindfulness.

## 2018-05-26 NOTE — Progress Notes (Signed)
Office: [phone]  /  Fax: [phone]   HPI:   Chief Complaint: OBESITY Olla is here to discuss her progress with her obesity treatment plan. She is on the  follow a lower carbohydrate, vegetable and lean protein rich diet plan and is following her eating plan approximately 80 % of the time. She states she is doing T25 25 minutes 3 times per week. Maquita's first week was very good on plan. She did indulge in bread at husbands birthday and indulged on [employer] cookies, she has 5 boxes at home. Her weight is 179 lb (81.2 kg) today and has had a weight loss of 1 pounds over a period of 2 weeks since her last visit. She has lost 4 lbs since starting treatment with [place].  Vitamin D deficiency Kaytlyn has a diagnosis of vitamin D deficiency. She is currently taking prescription Vit D and denies nausea, vomiting or muscle weakness.  Depression with emotional eating behaviors Forestine is struggling with emotional eating and using food for comfort to the extent that it is negatively impacting her health. She often snacks when she is not hungry. Danie sometimes feels she is out of control and then feels guilty that she made poor food choices. She has been working on behavior modification techniques to help reduce her emotional eating and has been somewhat successful. She shows no sign of suicidal or homicidal ideations. Shaquel's symptoms have much improved.  Depression screen [facility] 2/9 05/26/2018 05/09/2018 04/21/2018 03/24/2018 12/23/2017  Decreased Interest 0 0 1 1 1   Down, Depressed, Hopeless 1 1 1 2 3   PHQ - 2 Score 1 1 2 3 4   Altered sleeping 1 0 0 0 2  Tired, decreased energy 1 1 1 1 3   Change in appetite 1 0 2 3 2   Feeling bad or failure about yourself  0 1 0 2 1  Trouble concentrating 0 1 0 2 3  Moving slowly or fidgety/restless 0 0 0 0 0  Suicidal thoughts 0 0 0 0 0  PHQ-9 Score 4 4 5 11 15   Difficult doing work/chores - - - - Somewhat difficult     At risk for osteopenia and  osteoporosis Juline is at higher risk of osteopenia and osteoporosis due to vitamin D deficiency.    ASSESSMENT AND PLAN:  Vitamin D deficiency - Plan: Vitamin D, Ergocalciferol, (DRISDOL) 1.25 MG (50000 UT) CAPS capsule  Depressive disorder  At risk for osteoporosis  Class 1 obesity with serious comorbidity and body mass index (BMI) of 33.0 to 33.9 in adult, unspecified obesity type  PLAN:  Vitamin D Deficiency Reagann was informed that low vitamin D levels contributes to fatigue and are associated with obesity, breast, and colon cancer. She agrees to continue to take prescription Vit D 50,000 IU every week #4 with no refills and will follow up for routine testing of vitamin D, at least 2-3 times per year. She was informed of the risk of over-replacement of vitamin D and agrees to not increase her dose unless she discusses this with [place] first. Ajee agrees to follow up with our clinic in 2 weeks.  Depression with Emotional Eating Behaviors We discussed behavior modification techniques today to help Icyss deal with her emotional eating and depression. She has agrees to continue taking Zoloft qd and agrees to follow up with our clinic in 2 weeks.  At risk for osteopenia and osteoporosis Carle was given extended  (15 minutes) osteoporosis prevention counseling today. Jalaiyah is at risk for  osteopenia and osteoporosis due to her vitamin D deficiency. She was encouraged to take her vitamin D and follow her higher calcium diet and increase strengthening exercise to help strengthen her bones and decrease her risk of osteopenia and osteoporosis.  Obesity [name] is currently in the action stage of change. As such, her goal is to continue with weight loss efforts She has agreed to follow a lower carbohydrate, vegetable and lean protein rich diet plan [name] has been instructed to work up to a goal of 150 minutes of combined cardio and strengthening exercise per week for weight loss and  overall health benefits. We discussed the following Behavioral Modification Strategies today: increasing lean protein intake, increasing vegetables and work on meal planning, increase H20 intake and easy cooking plans  [name] has agreed to follow up with our clinic in 2 weeks. She was informed of the importance of frequent follow up visits to maximize her success with intensive lifestyle modifications for her multiple health conditions.  ALLERGIES: Allergies  Allergen Reactions  . Lamisil [Terbinafine] Hives    MEDICATIONS: Current Outpatient Medications on File Prior to Visit  Medication Sig Dispense Refill  . ASHWAGANDHA PO Take 300 mg by mouth daily.    . Cholecalciferol (VITAMIN D3) 2000 units CHEW Chew by mouth.    . Cranberry 500 MG CAPS Take by mouth.    . IRON PO Take 1 tablet by mouth daily.    Marland Kitchen. itraconazole (SPORANOX) 100 MG capsule Take 1 capsule (100 mg total) by mouth daily. 90 capsule 0  . Liraglutide -Weight Management (SAXENDA) 18 MG/3ML SOPN Inject 0.6 mg into the skin daily. 1 pen 0  . MAGNESIUM PO Take 1 tablet by mouth daily.    . metFORMIN (GLUCOPHAGE) 500 MG tablet Take 1 tablet (500 mg total) by mouth daily with breakfast. 30 tablet 0  . Multiple Vitamins-Minerals (MULTIVITAMIN PO) Take by mouth.    . NON FORMULARY Lemon balm herb  330 mg /mL qd prn    . Probiotic Product (PROBIOTIC DAILY PO) Take by mouth.    . progesterone (PROMETRIUM) 100 MG capsule Take 100 mg by mouth daily.    . sertraline (ZOLOFT) 50 MG tablet Take 1 tablet (50 mg total) by mouth daily. 30 tablet 0  . Turmeric 500 MG CAPS Take by mouth.    Marland Kitchen. VITAMIN K PO Take 150 mcg by mouth daily.     No current facility-administered medications on file prior to visit.     PAST MEDICAL HISTORY: Past Medical History:  Diagnosis Date  . Anxiety   . GERD (gastroesophageal reflux disease)   . Infertility, female   . PCOS (polycystic ovarian syndrome)   . Pre-diabetes   . Vaginismus   . Vitamin  D deficiency     PAST SURGICAL HISTORY: History reviewed. No pertinent surgical history.  SOCIAL HISTORY: Social History   Tobacco Use  . Smoking status: Never Smoker  . Smokeless tobacco: Never Used  Substance Use Topics  . Alcohol use: No  . Drug use: No    FAMILY HISTORY: Family History  Problem Relation Age of Onset  . High blood pressure Mother   . Thyroid disease Mother   . Obesity Mother   . AAA (abdominal aortic aneurysm) Father     ROS: Review of Systems  Constitutional: Positive for malaise/fatigue and weight loss.  Gastrointestinal: Negative for nausea and vomiting.  Musculoskeletal:       Negative for muscle weakness  Psychiatric/Behavioral: Positive for depression. Negative  for suicidal ideas.       Negative for homicidal ideation    PHYSICAL EXAM: Blood pressure 122/80, pulse 100, temperature 97.9 F (36.6 C), temperature source Oral, height 5\' 1"  (1.549 m), weight 179 lb (81.2 kg), SpO2 94 %. Body mass index is 33.82 kg/m. Physical Exam Vitals signs reviewed.  Constitutional:      Appearance: Normal appearance. She is obese.  Cardiovascular:     Rate and Rhythm: Normal rate.     Pulses: Normal pulses.  Pulmonary:     Effort: Pulmonary effort is normal.  Musculoskeletal: Normal range of motion.  Skin:    General: Skin is warm and dry.  Neurological:     Mental Status: She is alert and oriented to person, place, and time.  Psychiatric:        Mood and Affect: Mood normal.        Behavior: Behavior normal.     RECENT LABS AND TESTS: BMET    Component Value Date/Time   NA 141 12/23/2017 1249   K 4.2 12/23/2017 1249   CL 103 12/23/2017 1249   CO2 23 12/23/2017 1249   GLUCOSE 72 12/23/2017 1249   GLUCOSE 110 (H) 08/04/2013 2248   BUN 9 12/23/2017 1249   CREATININE 0.81 12/23/2017 1249   CALCIUM 9.2 12/23/2017 1249   GFRNONAA 98 12/23/2017 1249   GFRAA 113 12/23/2017 1249   Lab Results  Component Value Date   HGBA1C 5.6 04/21/2018    HGBA1C 5.6 12/23/2017   Lab Results  Component Value Date   INSULIN 19.9 04/21/2018   INSULIN 13.4 12/23/2017   CBC    Component Value Date/Time   WBC 9.7 12/23/2017 1249   WBC 14.2 (H) 08/04/2013 2248   RBC 4.68 12/23/2017 1249   RBC 4.62 08/04/2013 2248   HGB 12.8 12/23/2017 1249   HCT 40.5 12/23/2017 1249   PLT 427 (H) 08/04/2013 2248   MCV 87 12/23/2017 1249   MCH 27.4 12/23/2017 1249   MCH 29.2 08/04/2013 2248   MCHC 31.6 12/23/2017 1249   MCHC 33.7 08/04/2013 2248   RDW 12.5 12/23/2017 1249   LYMPHSABS 2.6 12/23/2017 1249   MONOABS 0.8 08/04/2013 2239   EOSABS 0.1 12/23/2017 1249   BASOSABS 0.0 12/23/2017 1249   Iron/TIBC/Ferritin/ %Sat No results found for: IRON, TIBC, FERRITIN, IRONPCTSAT Lipid Panel     Component Value Date/Time   CHOL 174 04/21/2018 0856   TRIG 73 04/21/2018 0856   HDL 38 (L) 04/21/2018 0856   LDLCALC 121 (H) 04/21/2018 0856   Hepatic Function Panel     Component Value Date/Time   PROT 7.0 12/23/2017 1249   ALBUMIN 3.9 12/23/2017 1249   AST 14 12/23/2017 1249   ALT 12 12/23/2017 1249   ALKPHOS 93 12/23/2017 1249   BILITOT 0.3 12/23/2017 1249   BILIDIR <0.10 06/12/2016 1309      Component Value Date/Time   TSH 1.290 12/23/2017 1249    Ref. Range 04/21/2018 08:56  Vitamin D, 25-Hydroxy Latest Ref Range: 30.0 - 100.0 ng/mL 27.1 (L)     OBESITY BEHAVIORAL INTERVENTION VISIT  Today's visit was # 9   Starting weight: 183 lbs Starting date: 12/23/2017 Today's weight : 179 lbs Today's date: 05/26/2018 Total lbs lost to date: 4   ASK: We discussed the diagnosis of obesity with [name] today and Crystina agreed to give [place] permission to discuss obesity behavioral modification therapy today.  ASSESS: Amazin has the diagnosis of obesity and her BMI today is  33.84 Korra is in the action stage of change   ADVISE: Xcaret was educated on the multiple health risks of obesity as well as the benefit of weight loss to improve  her health. She was advised of the need for long term treatment and the importance of lifestyle modifications to improve her current health and to decrease her risk of future health problems.  AGREE: Multiple dietary modification options and treatment options were discussed and  Brookes agreed to follow the recommendations documented in the above note.  ARRANGE: Margart was educated on the importance of frequent visits to treat obesity as outlined per CMS and USPSTF guidelines and agreed to schedule her next follow up appointment today.  I, Tammy Wysor, am acting as Energy manager for Debbra Riding MD  I have reviewed the above documentation for accuracy and completeness, and I agree with the above. - Debbra Riding, MD

## 2018-06-07 DIAGNOSIS — F430 Acute stress reaction: Secondary | ICD-10-CM | POA: Diagnosis not present

## 2018-06-10 MED FILL — VIT D2 1.25 MG (50,000 UNIT: 1.25 MG | 28 days supply | Qty: 4 | Fill #0

## 2018-06-13 NOTE — Progress Notes (Signed)
Office: [phone]  /  Fax: [phone]    Date: June 16, 2018   Time Seen: 9:00am Duration: 28 minutes Provider: Lawerance Cruel, Psy.D. Type of Session: Individual Therapy  Type of Contact: Face-to-face  Session Content: Cynthia Figueroa is a 33 y.o. female presenting for a follow-up appointment to address the previously established treatment goal of decreasing emotional eating. The session was initiated with the administration of the PHQ-9 and GAD-7, as well as a brief check-in. Regarding eating, Cynthia Figueroa stated, "It is not too bad, but sometimes I want something sweet." She indicated she eats dark chocolate chips. While the serving size reportedly stated 1 teaspoon, she indicated she typically consumed 1 cup "every other day" while menstruating. This provider and Cynthia Figueroa discussed possibly switching from the low carbohydrates plan to the initial structured meal plan, as she discussed difficulty with snacks and feeling full. Dailany indicated, "I've been thinking about physical versus emotional hunger." She denied episodes of emotional eating outside of when she was menstruating. She added, "Since my period, I haven't even picked up the dark chocolate chips." Regarding the referral placed by this provider, Cynthia Figueroa indicated she called the number back that left her a voicemail yesterday. She stated she left a message, and is waiting for a call back. Moreover, psychoeducation regarding mindfulness was provided. A handout was provided to Cynthia Figueroa with further information regarding mindfulness, including exercises. This provider also explained the benefit of mindfulness as it relates to emotional eating. Cynthia Figueroa was encouraged to engage in the provided exercises between now and the next appointment with this provider. Cynthia Figueroa agreed. She was also led through an exercise involving her senses. Cynthia Figueroa was receptive to today's session as evidenced by openness to sharing, responsiveness to feedback, and engagement in  a mindfulness exercise.  Mental Status Examination: Cynthia Figueroa arrived on time for the appointment. She presented as appropriately dressed and groomed. Cynthia Figueroa appeared her stated age and demonstrated adequate orientation to time, place, person, and purpose of the appointment. She also demonstrated appropriate eye contact. No psychomotor abnormalities or behavioral peculiarities noted. Her mood was euthymic with congruent affect. Her thought processes were logical, linear, and goal-directed. No hallucinations, delusions, bizarre thinking or behavior reported or observed. Judgment, insight, and impulse control appeared to be grossly intact. There was no evidence of paraphasias (i.e., errors in speech, gross mispronunciations, and word substitutions), repetition deficits, or disturbances in volume or prosody (i.e., rhythm and intonation). There was no evidence of attention or memory impairments. Cynthia Figueroa denied current suicidal and homicidal ideation, plan and intent.   Structured Assessment Results: The Patient Health Questionnaire-9 (PHQ-9) is a self-report measure that assesses symptoms and severity of depression over the course of the last two weeks. Cynthia Figueroa obtained a score of 3 suggesting minimal depression. Cynthia Figueroa finds the endorsed symptoms to be not difficult at all. Depression screen Cynthia Figueroa 2/9 06/16/2018  Decreased Interest 0  Down, Depressed, Hopeless 1  PHQ - 2 Score 1  Altered sleeping 0  Tired, decreased energy 0  Change in appetite 1  Feeling bad or failure about yourself  1  Trouble concentrating 0  Moving slowly or fidgety/restless 0  Suicidal thoughts 0  PHQ-9 Score 3  Difficult doing work/chores -   The Generalized Anxiety Disorder-7 (GAD-7) is a brief self-report measure that assesses symptoms of anxiety over the course of the last two weeks. Cynthia Figueroa obtained a score of 3 suggesting minimal anxiety. GAD 7 : Generalized Anxiety Score 06/16/2018  Nervous, Anxious, on Edge 1  Control/stop  worrying 0  Worry too much - different things 1  Trouble relaxing 0  Restless 0  Easily annoyed or irritable 1  Afraid - awful might happen 0  Total GAD 7 Score 3  Anxiety Difficulty Not difficult at all   Interventions:  Administration of PHQ-9 and GAD-7 for symptom monitoring Empathic reflections and validation Psychoeducation regarding mindfulness Mindfulness exercise Positive reinforcement Brief chart review  DSM-5 Diagnosis: 311 (F32.8) Other Specified Depressive Disorder, Emotional Eating Behaviors  Treatment Goal & Progress: During the initial appointment with this provider, the following treatment goal was established: decrease emotional eating. Cynthia Figueroa has demonstrated progress in her goal as evidenced by increased awareness of hunger patterns and triggers for emotional eating. Aside from when she was menstruating, Cynthia Figueroa denied episodes of emotional eating since last appointment this provider.  She also demonstrated willingness to engage in learned skills.  Plan: Cynthia Figueroa continues to appear able and willing to participate as evidenced by engagement in reciprocal conversation, and asking questions for clarification as appropriate. The next appointment will be scheduled in two weeks. The next session will focus further on mindfulness, and termination.

## 2018-06-16 ENCOUNTER — Ambulatory Visit (INDEPENDENT_AMBULATORY_CARE_PROVIDER_SITE_OTHER): Payer: 59 | Admitting: Psychology

## 2018-06-16 ENCOUNTER — Ambulatory Visit (INDEPENDENT_AMBULATORY_CARE_PROVIDER_SITE_OTHER): Payer: 59 | Admitting: Family Medicine

## 2018-06-16 ENCOUNTER — Encounter (INDEPENDENT_AMBULATORY_CARE_PROVIDER_SITE_OTHER): Payer: Self-pay | Admitting: Family Medicine

## 2018-06-16 VITALS — BP 117/72 | HR 70 | Temp 98.4°F | Ht 61.0 in | Wt 179.0 lb

## 2018-06-16 DIAGNOSIS — Z9189 Other specified personal risk factors, not elsewhere classified: Secondary | ICD-10-CM

## 2018-06-16 DIAGNOSIS — E8881 Metabolic syndrome: Secondary | ICD-10-CM

## 2018-06-16 DIAGNOSIS — F3289 Other specified depressive episodes: Secondary | ICD-10-CM | POA: Diagnosis not present

## 2018-06-16 DIAGNOSIS — E669 Obesity, unspecified: Secondary | ICD-10-CM

## 2018-06-16 DIAGNOSIS — Z6833 Body mass index (BMI) 33.0-33.9, adult: Secondary | ICD-10-CM

## 2018-06-18 NOTE — Progress Notes (Signed)
Office: [phone]  /  Fax: [phone]   HPI:   Chief Complaint: OBESITY Cynthia Figueroa is here to discuss her progress with her obesity treatment plan. She is on the lower carbohydrate, vegetable and Cynthia Figueroa protein rich diet plan and is following her eating plan approximately 90 % of the time. She states she is doing T-25 for 25 minutes 5 times per week. Cynthia Figueroa reports a good past few weeks. She has been indulging in dark chocolate chips. She has been working out 5 times per week and noticing significant changes.  Her weight is 179 lb (81.2 kg) today and has not lost weight since her last visit. She has lost 4 lbs since starting treatment with [place].  Insulin Resistance Cynthia Figueroa has a diagnosis of insulin resistance based on her elevated fasting insulin level >5. Although Cynthia Figueroa's blood glucose readings are still under good control, insulin resistance puts her at greater risk of metabolic syndrome and diabetes. She previously tried metformin but had significant GI issues. She notes carbohydrate cravings and continues to work on diet and exercise to decrease risk of diabetes.  At risk for diabetes Cynthia Figueroa is at higher than average risk for developing diabetes due to her obesity and insulin resistance. She currently denies polyuria or polydipsia.  Depression with emotional eating behaviors Cynthia Figueroa notes improved symptoms on Zoloft. Cynthia Figueroa struggles with emotional eating and using food for comfort to the extent that it is negatively impacting her health. She often snacks when she is not hungry. Cynthia Figueroa sometimes feels she is out of control and then feels guilty that she made poor food choices. She has been working on behavior modification techniques to help reduce her emotional eating and has been somewhat successful. She shows no sign of suicidal or homicidal ideations.  Depression screen Cynthia Figueroa  Decreased Interest 0 0 0 1 1  Down, Depressed,  Hopeless PHQ - 2 Score Altered sleeping 0 1 0 0 0  Tired, decreased energy 0 Change in appetite 1 1 0 2 3  Feeling bad or failure about yourself  1 0 1 0 2  Trouble concentrating 0 0 1 0 2  Moving slowly or fidgety/restless 0 0 0 0 0  Suicidal thoughts 0 0 0 0 0  PHQ-9 Score Difficult doing work/chores - - - - -    ASSESSMENT AND PLAN:  Class 2 severe obesity with serious comorbidity and body mass index (BMI) of 35.0 to 35.9 in adult, unspecified obesity type (HCC) - Plan: Liraglutide -Weight Management (SAXENDA) 18 MG/3ML SOPN  Other depression - with emotional eating - Plan: sertraline (ZOLOFT) 25 MG tablet  At risk for diabetes mellitus  Class 1 obesity with serious comorbidity and body mass index (BMI) of 33.0 to 33.9 in adult, unspecified obesity type  PLAN:  Insulin Resistance Cynthia Figueroa will continue to work on weight loss, exercise, and decreasing simple carbohydrates in her diet to help decrease the risk of diabetes. We dicussed metformin including benefits and risks. She was informed that eating too many simple carbohydrates or too many calories at one sitting increases the likelihood of GI side effects. Cynthia Figueroa agrees to start Victoza 0.6 mg Sub Q daily #1 pen with no refills. Cynthia Figueroa agrees to follow up with our clinic in 2 weeks as directed to monitor her progress.  Diabetes risk counseling Cynthia Figueroa was given extended (15  minutes) diabetes prevention counseling today. She is 33 y.o. female and has risk factors for diabetes including obesity and insulin resistance. We discussed intensive lifestyle modifications today with an emphasis on weight loss as well as increasing exercise and decreasing simple carbohydrates in her diet.  Depression with Emotional Eating Behaviors We discussed behavior modification techniques today to help Cynthia Figueroa deal with her emotional eating and depression. Cynthia Figueroa agrees to decrease Zoloft to 25 mg PO daily  #30 with no refills. Cynthia Figueroa agrees to follow up with our clinic in 2 weeks.  Obesity Cynthia Figueroa is currently in the action stage of change. As such, her goal is to continue with weight loss efforts She has agreed to follow a lower carbohydrate, vegetable and Cynthia Figueroa protein rich diet plan Cynthia Figueroa has been instructed to work up to a goal of 150 minutes of combined cardio and strengthening exercise per week for weight loss and overall health benefits. We discussed the following Behavioral Modification Strategies today: increasing Cynthia Figueroa protein intake, increasing vegetables, work on meal planning and easy cooking plans, better snacking choices, and planning for success Cynthia Figueroa is to MyChart if she is hungry.  Cynthia Figueroa has agreed to follow up with our clinic in 2 weeks. She was informed of the importance of frequent follow up visits to maximize her success with intensive lifestyle modifications for her multiple health conditions.  ALLERGIES: Allergies  Allergen Reactions  . Lamisil [Terbinafine] Hives    MEDICATIONS: Current Outpatient Medications on File Prior to Visit  Medication Sig Dispense Refill  . ASHWAGANDHA PO Take 300 mg by mouth daily.    . Cholecalciferol (VITAMIN D3) 2000 units CHEW Chew by mouth.    . Cranberry 500 MG CAPS Take by mouth.    . IRON PO Take 1 tablet by mouth daily.    Marland Kitchen itraconazole (SPORANOX) 100 MG capsule Take 1 capsule (100 mg total) by mouth daily. 90 capsule 0  . Liraglutide -Weight Management (SAXENDA) 18 MG/3ML SOPN Inject 0.6 mg into the skin daily. 1 pen 0  . MAGNESIUM PO Take 1 tablet by mouth daily.    . metFORMIN (GLUCOPHAGE) 500 MG tablet Take 1 tablet (500 mg total) by mouth daily with breakfast. 30 tablet 0  . Multiple Vitamins-Minerals (MULTIVITAMIN PO) Take by mouth.    . NON FORMULARY Lemon balm herb  330 mg /mL qd prn    . Probiotic Product (PROBIOTIC DAILY PO) Take by mouth.    . progesterone (PROMETRIUM) 100 MG capsule Take 100 mg by mouth  daily.    . Turmeric 500 MG CAPS Take by mouth.    . Vitamin D, Ergocalciferol, (DRISDOL) 1.25 MG (50000 UT) CAPS capsule Take 1 capsule (50,000 Units total) by mouth every 7 (seven) days. 4 capsule 0  . VITAMIN K PO Take 150 mcg by mouth daily.     No current facility-administered medications on file prior to visit.     PAST MEDICAL HISTORY: Past Medical History:  Diagnosis Date  . Anxiety   . GERD (gastroesophageal reflux disease)   . Infertility, female   . PCOS (polycystic ovarian syndrome)   . Pre-diabetes   . Vaginismus   . Vitamin D deficiency     PAST SURGICAL HISTORY: History reviewed. No pertinent surgical history.  SOCIAL HISTORY: Social History   Tobacco Use  . Smoking status: Never Smoker  . Smokeless tobacco: Never Used  Substance Use Topics  . Alcohol use: No  . Drug use: No    FAMILY HISTORY: Family History  Problem Relation Age of Onset  . High blood pressure Mother   . Thyroid disease Mother   . Obesity Mother   . AAA (abdominal aortic aneurysm) Father     ROS: Review of Systems  Constitutional: Negative for weight loss.  Genitourinary: Negative for frequency.  Endo/Heme/Allergies: Negative for polydipsia.  Psychiatric/Behavioral: Positive for depression. Negative for suicidal ideas.    PHYSICAL EXAM: Blood pressure 117/72, pulse 70, temperature 98.4 F (36.9 C), temperature source Oral, height  (1.549 m), weight 179 lb (81.2 kg), SpO2 100 %. Body mass index is 33.82 kg/m. Physical Exam Vitals signs reviewed.  Constitutional:      Appearance: Normal appearance. She is obese.  Cardiovascular:     Rate and Rhythm: Normal rate.     Pulses: Normal pulses.  Pulmonary:     Effort: Pulmonary effort is normal.     Breath sounds: Normal breath sounds.  Musculoskeletal: Normal range of motion.  Skin:    General: Skin is warm and dry.  Neurological:     Mental Status: She is alert and oriented to person, place, and time.  Psychiatric:         Mood and Affect: Mood normal.        Behavior: Behavior normal.     RECENT LABS AND TESTS: BMET    Component Value Date/Time   NA 141 12/23/2017 1249   K 4.2 12/23/2017 1249   CL 103 12/23/2017 1249   CO2 23 12/23/2017 1249   GLUCOSE 72 12/23/2017 1249   GLUCOSE 110 (H) 08/04/2013 2248   BUN 9 12/23/2017 1249   CREATININE 0.81 12/23/2017 1249   CALCIUM 9.2 12/23/2017 1249   GFRNONAA 98 12/23/2017 1249   GFRAA 113 12/23/2017 1249   Lab Results  Component Value Date   HGBA1C 5.6 04/21/2018   HGBA1C 5.6 12/23/2017   Lab Results  Component Value Date   INSULIN 19.9 04/21/2018   INSULIN 13.4 12/23/2017   CBC    Component Value Date/Time   WBC 9.7 12/23/2017 1249   WBC 14.2 (H) 08/04/2013 2248   RBC 4.68 12/23/2017 1249   RBC 4.62 08/04/2013 2248   HGB 12.8 12/23/2017 1249   HCT 40.5 12/23/2017 1249   PLT 427 (H) 08/04/2013 2248   MCV 87 12/23/2017 1249   MCH 27.4 12/23/2017 1249   MCH 29.2 08/04/2013 2248   MCHC 31.6 12/23/2017 1249   MCHC 33.7 08/04/2013 2248   RDW 12.5 12/23/2017 1249   LYMPHSABS 2.6 12/23/2017 1249   MONOABS 0.8 08/04/2013 2239   EOSABS 0.1 12/23/2017 1249   BASOSABS 0.0 12/23/2017 1249   Iron/TIBC/Ferritin/ %Sat No results found for: IRON, TIBC, FERRITIN, IRONPCTSAT Lipid Panel     Component Value Date/Time   CHOL 174 04/21/2018 0856   TRIG 73 04/21/2018 0856   HDL 38 (L) 04/21/2018 0856   LDLCALC 121 (H) 04/21/2018 0856   Hepatic Function Panel     Component Value Date/Time   PROT 7.0 12/23/2017 1249   ALBUMIN 3.9 12/23/2017 1249   AST 14 12/23/2017 1249   ALT 12 12/23/2017 1249   ALKPHOS 93 12/23/2017 1249   BILITOT 0.3 12/23/2017 1249   BILIDIR <0.10 06/12/2016 1309      Component Value Date/Time   TSH 1.290 12/23/2017 1249      OBESITY BEHAVIORAL INTERVENTION VISIT  Today's visit was # 10   Starting weight: 183 lbs Starting date: 12/23/17 Today's weight : 179 lbs  Today's date: 06/16/2018 Total lbs lost  to  date: 4    06/16/2018  Height 5\' 1"  (1.549 m)  Weight 179 lb (81.2 kg)  BMI (Calculated) 33.84  BLOOD PRESSURE - SYSTOLIC 117  BLOOD PRESSURE - DIASTOLIC 72   Body Fat % 39.6 %  Total Body Water (lbs) 75.2 lbs     ASK: We discussed the diagnosis of obesity with [name] today and Shastina agreed to give [place] permission to discuss obesity behavioral modification therapy today.  ASSESS: Deola has the diagnosis of obesity and her BMI today is 33.84 Angelea is in the action stage of change   ADVISE: Yagmur was educated on the multiple health risks of obesity as well as the benefit of weight loss to improve her health. She was advised of the need for long term treatment and the importance of lifestyle modifications to improve her current health and to decrease her risk of future health problems.  AGREE: Multiple dietary modification options and treatment options were discussed and  Ambrie agreed to follow the recommendations documented in the above note.  ARRANGE: Maciee was educated on the importance of frequent visits to treat obesity as outlined per CMS and USPSTF guidelines and agreed to schedule her next follow up appointment today.  I, Burt Knack, am acting as transcriptionist for Debbra Riding, MD  I have reviewed the above documentation for accuracy and completeness, and I agree with the above. - Debbra Riding, MD

## 2018-06-20 ENCOUNTER — Telehealth: Payer: 59 | Admitting: Family

## 2018-06-20 DIAGNOSIS — R42 Dizziness and giddiness: Secondary | ICD-10-CM | POA: Diagnosis not present

## 2018-06-20 MED ORDER — MECLIZINE HCL 25 MG PO TABS: 25.0000 mg | ORAL_TABLET | Freq: Three times a day (TID) | ORAL | 0 refills | Status: DC | PRN

## 2018-06-20 MED ORDER — LIRAGLUTIDE 18 MG/3ML ~~LOC~~ SOPN: 0.6000 mg | PEN_INJECTOR | SUBCUTANEOUS | 0 refills | Status: DC

## 2018-06-20 MED ORDER — DIMENHYDRINATE 50 MG PO TABS: 50.0000 mg | ORAL_TABLET | Freq: Three times a day (TID) | ORAL | 0 refills | Status: DC | PRN

## 2018-06-20 MED ORDER — SERTRALINE HCL 25 MG PO TABS: 25.0000 mg | ORAL_TABLET | Freq: Every day | ORAL | 0 refills | Status: DC

## 2018-06-20 MED FILL — SERTRALINE HCL 25 MG TABLET: 25 | 30 days supply | Qty: 30 | Fill #0

## 2018-06-20 NOTE — Addendum Note (Signed)
Addended by: Jannifer Rodney A on: 06/20/2018 07:40 PM   Modules accepted: Orders

## 2018-06-20 NOTE — Progress Notes (Signed)
E Visit for Motion Sickness  We are sorry that you are not feeling well. Here is how we plan to help!  Based on what you have shared with me it looks like you have symptoms of motion sickness.  I have prescribed a medication that will help prevent or alleviate your symptoms:  Meclizine 25mg  by mouth three times per day as needed for nausea/motion sickness.  Approximately 5 minutes spent reviewing and documenting in patient's chart.   Prevention:  You might feel better if you keep your eyes focused on outside while you are in motion. For example, if you are in a car, sit in the front and look in the direction you are moving; if you are on a boat, stay on the deck and look to the horizon. This helps make what you see match the movement you are feeling, and so you are less likely to feel sick.  You should also avoid reading, watching a movie, texting or reading messages, or looking at things close to you inside the vehicle you are riding in.  . Use the seat head rest. Lean your head against the back of the seat or head rest when traveling in vehicles with seats to minimize head movements.  . On a ship: When making your reservations, choose a cabin in the middle of the ship and near the waterline. When on board, go up on deck and focus on the horizon.  . In an airplane: Request a window seat and look out the window. A seat over the front edge of the wing is the most preferable spot (the degree of motion is the lowest here). Direct the air vent to blow cool air on your face.  . On a train: Always face forward and sit near a window.  . In a vehicle: Sit in the front seat; if you are the passenger, look at the scenery in the distance. For some people, driving the vehicle (rather than being a passenger) is an instant remedy.  . Avoid others who have become nauseous with motion sickness. Seeing and smelling others who have motion sickness may cause you to become sick.  GET HELP RIGHT AWAY  IF:   Your symptoms do not improve or worsen within 2 days after treatment.   You cannot keep down fluids after trying the medication.   Other associated symptoms such as severe headache, visual field changes, fever, or intractable nausea and vomiting.  MAKE SURE YOU:   Understand these instructions.  Will watch your condition.  Will get help right away if you are not doing well or get worse.  Thank you for choosing an e-visit.  Your e-visit answers were reviewed by a board certified advanced clinical practitioner to complete your personal care plan. Depending upon the condition, your plan could have included both over the counter or prescription medications.  Please review your pharmacy choice. Be sure that the pharmacy you have chosen is open so that you can pick up your prescription now.  If there is a problem you may message your provider in MyChart to have the prescription routed to another pharmacy.  Your safety is important to [place]. If you have drug allergies check your prescription carefully.   For the next 24 hours, you can use MyChart to ask questions about today's visit, request a non-urgent call back, or ask for a work or school excuse from your e-visit provider.  You will get an e-mail in the next two days asking about your experience.  I hope that your e-visit has been valuable and will speed your recovery.   References or for more information: [email or web address] [email or web address]

## 2018-06-27 ENCOUNTER — Encounter (INDEPENDENT_AMBULATORY_CARE_PROVIDER_SITE_OTHER): Payer: Self-pay

## 2018-07-06 ENCOUNTER — Encounter (INDEPENDENT_AMBULATORY_CARE_PROVIDER_SITE_OTHER): Payer: Self-pay

## 2018-07-07 ENCOUNTER — Ambulatory Visit (INDEPENDENT_AMBULATORY_CARE_PROVIDER_SITE_OTHER): Payer: Self-pay | Admitting: Psychology

## 2018-07-07 ENCOUNTER — Encounter (INDEPENDENT_AMBULATORY_CARE_PROVIDER_SITE_OTHER): Payer: Self-pay

## 2018-07-07 ENCOUNTER — Ambulatory Visit (INDEPENDENT_AMBULATORY_CARE_PROVIDER_SITE_OTHER): Payer: 59 | Admitting: Family Medicine

## 2018-07-07 ENCOUNTER — Encounter (INDEPENDENT_AMBULATORY_CARE_PROVIDER_SITE_OTHER): Payer: Self-pay | Admitting: Family Medicine

## 2018-07-07 ENCOUNTER — Other Ambulatory Visit: Payer: Self-pay

## 2018-07-07 DIAGNOSIS — F419 Anxiety disorder, unspecified: Secondary | ICD-10-CM | POA: Diagnosis not present

## 2018-07-07 DIAGNOSIS — E669 Obesity, unspecified: Secondary | ICD-10-CM

## 2018-07-07 DIAGNOSIS — E8881 Metabolic syndrome: Secondary | ICD-10-CM

## 2018-07-07 DIAGNOSIS — F3289 Other specified depressive episodes: Secondary | ICD-10-CM

## 2018-07-07 DIAGNOSIS — E559 Vitamin D deficiency, unspecified: Secondary | ICD-10-CM | POA: Diagnosis not present

## 2018-07-07 DIAGNOSIS — F32A Depression, unspecified: Secondary | ICD-10-CM

## 2018-07-07 DIAGNOSIS — Z6833 Body mass index (BMI) 33.0-33.9, adult: Secondary | ICD-10-CM | POA: Diagnosis not present

## 2018-07-07 DIAGNOSIS — F329 Major depressive disorder, single episode, unspecified: Secondary | ICD-10-CM | POA: Diagnosis not present

## 2018-07-07 MED ORDER — VITAMIN D (ERGOCALCIFEROL) 1.25 MG (50000 UNIT) PO CAPS: 50000.0000 [IU] | ORAL_CAPSULE | ORAL | 0 refills | Status: DC

## 2018-07-07 MED ORDER — METFORMIN HCL 500 MG PO TABS: 500.0000 mg | ORAL_TABLET | Freq: Every day | ORAL | 0 refills | Status: DC

## 2018-07-07 MED ORDER — SERTRALINE HCL 50 MG PO TABS: 50.0000 mg | ORAL_TABLET | Freq: Every day | ORAL | 0 refills | Status: DC

## 2018-07-11 NOTE — Progress Notes (Signed)
Office: [phone]  /  Fax: [phone] TeleHealth Visit:  Cynthia Figueroa has consented to this TeleHealth visit today via telephone call. The patient is located at home, the provider is located at the [facility] office. The participants in this visit include the listed provider and patient and provider's assistant. Time spent on visit was 20 minutes  HPI:   Chief Complaint: OBESITY Cynthia Figueroa is here to discuss her progress with her obesity treatment plan. She is on the lower carbohydrate, vegetable and lean protein rich diet plan and is following her eating plan approximately 70 % of the time. She states she is cardio for 25 minutes 3 times per week. Cynthia Figueroa is still working and her office is giving the option to work from home. She is weighing at home. She doesn't feel the low carbohydrate plan is going to be doable for the next few weeks. She has 9 people in her house and is having limited access in the grocery store. She is still exercising and is going to add the elliptical.  We were unable to weight the patient today for this TeleHealth visit. She feels as if she has gained 3 lbs since her last visit. She has lost 4 lbs since starting treatment with [place].  Vitamin D Deficiency Cynthia Figueroa has a diagnosis of vitamin D deficiency. She is currently taking prescription Vit D. She notes fatigue and denies nausea, vomiting or muscle weakness.  Insulin Resistance Cynthia Figueroa has a diagnosis of insulin resistance based on her elevated fasting insulin level >5. Although Cynthia Figueroa's blood glucose readings are still under good control, insulin resistance puts her at greater risk of metabolic syndrome and diabetes. She was denied for Victoza and Saxenda. She plans to try to get healthy for pregnancy and continues to work on diet and exercise to decrease risk of diabetes.  Depression with Anxiety Cynthia Figueroa is struggling with depression and anxiety. She notes irritability and stress. She notes her  blood pressure is running at 117/80. She shows no sign of suicidal or homicidal ideations.  ASSESSMENT AND PLAN:  Vitamin D deficiency - Plan: Vitamin D, Ergocalciferol, (DRISDOL) 1.25 MG (50000 UT) CAPS capsule  Insulin resistance - Plan: metFORMIN (GLUCOPHAGE) 500 MG tablet  Anxiety and depression  Class 1 obesity with serious comorbidity and body mass index (BMI) of 33.0 to 33.9 in adult, unspecified obesity type  Other depression - with emotional eating - Plan: sertraline (ZOLOFT) 50 MG tablet  PLAN:  Vitamin D Deficiency Cynthia Figueroa was informed that low vitamin D levels contributes to fatigue and are associated with obesity, breast, and colon cancer. [name] agrees to continue taking prescription Vit D @50 ,000 IU every week #4 and we will refill for 1 month. She will follow up for routine testing of vitamin D, at least 2-3 times per year. She was informed of the risk of over-replacement of vitamin D and agrees to not increase her dose unless she discusses this with [place] first. Cynthia Figueroa agrees to follow up with our clinic in 2 weeks.  Insulin Resistance Cynthia Figueroa will continue to work on weight loss, exercise, and decreasing simple carbohydrates in her diet to help decrease the risk of diabetes. We dicussed metformin including benefits and risks. She was informed that eating too many simple carbohydrates or too many calories at one sitting increases the likelihood of GI side effects. Cynthia Figueroa agrees to restart metformin 500 mg PO q AM #30 with no refills. Cynthia Figueroa agrees to follow up with our clinic in 2 weeks as directed  to monitor her progress.  Depression with Anxiety We discussed behavior modification techniques today to help Cynthia Figueroa deal with her depression and anxiety. Cynthia Figueroa agrees to increase Zoloft to 50 mg PO daily #30 with no refills. Cynthia Figueroa agrees to follow up with our clinic in 2 weeks.  Obesity Cynthia Figueroa is currently in the action stage of change. As such, her goal is to continue  with weight loss efforts She has agreed to keep a food journal with 250-350 calories and 20+ grams of protein at breakfast daily, keep a food journal with 300-400 calories and 25+ grams of protein at lunch daily and follow the Category 2 plan or keep a food journal with 1200-1300 calories and 85+ grams of protein daily Cynthia Figueroa has been instructed to work up to a goal of 150 minutes of combined cardio and strengthening exercise per week for weight loss and overall health benefits. We discussed the following Behavioral Modification Strategies today: increasing lean protein intake, increasing vegetables, work on meal planning and easy cooking plans, and planning for success   Cynthia Figueroa has agreed to follow up with our clinic in 2 weeks. She was informed of the importance of frequent follow up visits to maximize her success with intensive lifestyle modifications for her multiple health conditions.  ALLERGIES: Allergies  Allergen Reactions  . Lamisil [Terbinafine] Hives    MEDICATIONS: Current Outpatient Medications on File Prior to Visit  Medication Sig Dispense Refill  . ASHWAGANDHA PO Take 300 mg by mouth daily.    . Cholecalciferol (VITAMIN D3) 2000 units CHEW Chew by mouth.    . Cranberry 500 MG CAPS Take by mouth.    . dimenhyDRINATE (DRAMAMINE) 50 MG tablet Take 1 tablet (50 mg total) by mouth every 8 (eight) hours as needed. 30 tablet 0  . IRON PO Take 1 tablet by mouth daily.    Marland Kitchen itraconazole (SPORANOX) 100 MG capsule Take 1 capsule (100 mg total) by mouth daily. 90 capsule 0  . liraglutide (VICTOZA) 18 MG/3ML SOPN Inject 0.1 mLs (0.6 mg total) into the skin every morning. 1 pen 0  . Liraglutide -Weight Management (SAXENDA) 18 MG/3ML SOPN Inject 0.6 mg into the skin daily. 1 pen 0  . MAGNESIUM PO Take 1 tablet by mouth daily.    . meclizine (ANTIVERT) 25 MG tablet Take 1 tablet (25 mg total) by mouth 3 (three) times daily as needed for dizziness. 30 tablet 0  . Multiple  Vitamins-Minerals (MULTIVITAMIN PO) Take by mouth.    . NON FORMULARY Lemon balm herb  330 mg /mL qd prn    . Probiotic Product (PROBIOTIC DAILY PO) Take by mouth.    . progesterone (PROMETRIUM) 100 MG capsule Take 100 mg by mouth daily.    . Turmeric 500 MG CAPS Take by mouth.    Marland Kitchen VITAMIN K PO Take 150 mcg by mouth daily.     No current facility-administered medications on file prior to visit.     PAST MEDICAL HISTORY: Past Medical History:  Diagnosis Date  . Anxiety   . GERD (gastroesophageal reflux disease)   . Infertility, female   . PCOS (polycystic ovarian syndrome)   . Pre-diabetes   . Vaginismus   . Vitamin D deficiency     PAST SURGICAL HISTORY: History reviewed. No pertinent surgical history.  SOCIAL HISTORY: Social History   Tobacco Use  . Smoking status: Never Smoker  . Smokeless tobacco: Never Used  Substance Use Topics  . Alcohol use: No  . Drug use: No  FAMILY HISTORY: Family History  Problem Relation Age of Onset  . High blood pressure Mother   . Thyroid disease Mother   . Obesity Mother   . AAA (abdominal aortic aneurysm) Father     ROS: Review of Systems  Constitutional: Positive for malaise/fatigue. Negative for weight loss.  Gastrointestinal: Negative for nausea and vomiting.  Musculoskeletal:       Negative muscle weakness  Psychiatric/Behavioral: Positive for depression. Negative for suicidal ideas.       + Anxiety + Stress + Irritability    PHYSICAL EXAM: Pt in no acute distress  RECENT LABS AND TESTS: BMET    Component Value Date/Time   NA 141 12/23/2017 1249   K 4.2 12/23/2017 1249   CL 103 12/23/2017 1249   CO2 23 12/23/2017 1249   GLUCOSE 72 12/23/2017 1249   GLUCOSE 110 (H) 08/04/2013 2248   BUN 9 12/23/2017 1249   CREATININE 0.81 12/23/2017 1249   CALCIUM 9.2 12/23/2017 1249   GFRNONAA 98 12/23/2017 1249   GFRAA 113 12/23/2017 1249   Lab Results  Component Value Date   HGBA1C 5.6 04/21/2018   HGBA1C 5.6  12/23/2017   Lab Results  Component Value Date   INSULIN 19.9 04/21/2018   INSULIN 13.4 12/23/2017   CBC    Component Value Date/Time   WBC 9.7 12/23/2017 1249   WBC 14.2 (H) 08/04/2013 2248   RBC 4.68 12/23/2017 1249   RBC 4.62 08/04/2013 2248   HGB 12.8 12/23/2017 1249   HCT 40.5 12/23/2017 1249   PLT 427 (H) 08/04/2013 2248   MCV 87 12/23/2017 1249   MCH 27.4 12/23/2017 1249   MCH 29.2 08/04/2013 2248   MCHC 31.6 12/23/2017 1249   MCHC 33.7 08/04/2013 2248   RDW 12.5 12/23/2017 1249   LYMPHSABS 2.6 12/23/2017 1249   MONOABS 0.8 08/04/2013 2239   EOSABS 0.1 12/23/2017 1249   BASOSABS 0.0 12/23/2017 1249   Iron/TIBC/Ferritin/ %Sat No results found for: IRON, TIBC, FERRITIN, IRONPCTSAT Lipid Panel     Component Value Date/Time   CHOL 174 04/21/2018 0856   TRIG 73 04/21/2018 0856   HDL 38 (L) 04/21/2018 0856   LDLCALC 121 (H) 04/21/2018 0856   Hepatic Function Panel     Component Value Date/Time   PROT 7.0 12/23/2017 1249   ALBUMIN 3.9 12/23/2017 1249   AST 14 12/23/2017 1249   ALT 12 12/23/2017 1249   ALKPHOS 93 12/23/2017 1249   BILITOT 0.3 12/23/2017 1249   BILIDIR <0.10 06/12/2016 1309      Component Value Date/Time   TSH 1.290 12/23/2017 1249      I, Burt Knack, am acting as transcriptionist for Debbra Riding, MD  I have reviewed the above documentation for accuracy and completeness, and I agree with the above. - Debbra Riding, MD

## 2018-07-20 ENCOUNTER — Encounter (INDEPENDENT_AMBULATORY_CARE_PROVIDER_SITE_OTHER): Payer: Self-pay | Admitting: Family Medicine

## 2018-07-20 ENCOUNTER — Other Ambulatory Visit: Payer: Self-pay

## 2018-07-20 ENCOUNTER — Telehealth (INDEPENDENT_AMBULATORY_CARE_PROVIDER_SITE_OTHER): Payer: Self-pay | Admitting: Psychology

## 2018-07-20 ENCOUNTER — Ambulatory Visit (INDEPENDENT_AMBULATORY_CARE_PROVIDER_SITE_OTHER): Payer: 59 | Admitting: Family Medicine

## 2018-07-20 DIAGNOSIS — Z6833 Body mass index (BMI) 33.0-33.9, adult: Secondary | ICD-10-CM

## 2018-07-20 DIAGNOSIS — E669 Obesity, unspecified: Secondary | ICD-10-CM

## 2018-07-20 DIAGNOSIS — F430 Acute stress reaction: Secondary | ICD-10-CM | POA: Diagnosis not present

## 2018-07-20 DIAGNOSIS — E559 Vitamin D deficiency, unspecified: Secondary | ICD-10-CM

## 2018-07-20 DIAGNOSIS — E7849 Other hyperlipidemia: Secondary | ICD-10-CM

## 2018-07-20 MED FILL — metFORMIN HCL 500 MG TABS: 500 | 30 days supply | Qty: 30 | Fill #0

## 2018-07-20 MED FILL — VIT D2 1.25 MG (50,000 UNIT: 1.25 MG | 28 days supply | Qty: 4 | Fill #0

## 2018-07-20 MED FILL — SERTRALINE HCL 50 MG TABLET: 50 | 30 days supply | Qty: 30 | Fill #0

## 2018-07-20 NOTE — Progress Notes (Signed)
Office: [phone]  /  Fax: [phone] TeleHealth Visit:  Cynthia Figueroa has verbally consented to this TeleHealth visit today. The patient is located at home, the provider is located at the [facility] office. The participants in this visit include the listed provider and patient and provider's assistant. The visit was conducted today via face time.  HPI:   Chief Complaint: OBESITY Cynthia Figueroa is here to discuss her progress with her obesity treatment plan. She is on the keep a food journal with 1200-1300 calories and 85+ grams of protein daily or follow the Category 2 plan and is following her eating plan approximately 50 % of the time. She states she is exercising 0 minutes 0 times per week. Cynthia Figueroa is having a hard time finding eggs and bread. She is doing some stress and boredom eating. She feels she is doing more work at home than she was in the office. She is having limited time to eat.  We were unable to weigh the patient today for this TeleHealth visit. She feels as if she has gained 2 lbs since her last visit. She has lost 4 lbs since starting treatment with us.  Hyperlipidemia Cynthia Figueroa has hyperlipidemia and has been trying to improve her cholesterol levels with intensive lifestyle modification including a low saturated fat diet, exercise and weight loss. Last LDL was elevated and HDL of 38. She denies any chest pain, claudication or myalgias.  Vitamin D Deficiency Cynthia Figueroa has a diagnosis of vitamin D deficiency. She is currently taking prescription Vit D. She notes fatigue and denies nausea, vomiting or muscle weakness.  ASSESSMENT AND PLAN:  Other hyperlipidemia  Vitamin D deficiency  Class 1 obesity with serious comorbidity and body mass index (BMI) of 33.0 to 33.9 in adult, unspecified obesity type  PLAN:  Hyperlipidemia Cynthia Figueroa was informed of the American Heart Association Guidelines emphasizing intensive lifestyle modifications as the first line  treatment for hyperlipidemia. We discussed many lifestyle modifications today in depth, and Cynthia Figueroa will continue to work on decreasing saturated fats such as fatty red meat, butter and many fried foods. She will also increase vegetables and lean protein in her diet and continue to work on diet, exercise, and weight loss efforts. We will repeat labs at next appointment. Cynthia Figueroa agrees to follow up with our clinic in 2 weeks.  Vitamin D Deficiency Cynthia Figueroa was informed that low vitamin D levels contributes to fatigue and are associated with obesity, breast, and colon cancer. Cynthia Figueroa agrees to continue taking prescription Vit D @50 ,000 IU every week, no refill needed. She will follow up for routine testing of vitamin D, at least 2-3 times per year. She was informed of the risk of over-replacement of vitamin D and agrees to not increase her dose unless she discusses this with us first. Cynthia Figueroa agrees to follow up with our clinic in 2 weeks.  Obesity Cynthia Figueroa is currently in the action stage of change. As such, her goal is to continue with weight loss efforts She has agreed to follow a lower carbohydrate, vegetable and lean protein rich diet plan Cynthia Figueroa has been instructed to work up to a goal of 150 minutes of combined cardio and strengthening exercise per week for weight loss and overall health benefits. We discussed the following Behavioral Modification Strategies today: increasing lean protein intake, work on meal planning and easy cooking plans, ways to avoid boredom eating, avoiding temptations, and planning for success Cynthia Figueroa is substituting in 8 oz of Fairlife to take metformin. She is the  plan to walk or go back to doing T25 1-2 times per week.  Cynthia Figueroa has agreed to follow up with our clinic in 2 weeks. She was informed of the importance of frequent follow up visits to maximize her success with intensive lifestyle modifications for her multiple health conditions.  ALLERGIES: Allergies   Allergen Reactions  . Lamisil [Terbinafine] Hives    MEDICATIONS: Current Outpatient Medications on File Prior to Visit  Medication Sig Dispense Refill  . ASHWAGANDHA PO Take 300 mg by mouth daily.    . Cholecalciferol (VITAMIN D3) 2000 units CHEW Chew by mouth.    . Cranberry 500 MG CAPS Take by mouth.    . dimenhyDRINATE (DRAMAMINE) 50 MG tablet Take 1 tablet (50 mg total) by mouth every 8 (eight) hours as needed. 30 tablet 0  . IRON PO Take 1 tablet by mouth daily.    Marland Kitchen itraconazole (SPORANOX) 100 MG capsule Take 1 capsule (100 mg total) by mouth daily. 90 capsule 0  . liraglutide (VICTOZA) 18 MG/3ML SOPN Inject 0.1 mLs (0.6 mg total) into the skin every morning. 1 pen 0  . Liraglutide -Weight Management (SAXENDA) 18 MG/3ML SOPN Inject 0.6 mg into the skin daily. 1 pen 0  . MAGNESIUM PO Take 1 tablet by mouth daily.    . meclizine (ANTIVERT) 25 MG tablet Take 1 tablet (25 mg total) by mouth 3 (three) times daily as needed for dizziness. 30 tablet 0  . metFORMIN (GLUCOPHAGE) 500 MG tablet Take 1 tablet (500 mg total) by mouth daily with breakfast. 30 tablet 0  . Multiple Vitamins-Minerals (MULTIVITAMIN PO) Take by mouth.    . NON FORMULARY Lemon balm herb  330 mg /mL qd prn    . Probiotic Product (PROBIOTIC DAILY PO) Take by mouth.    . progesterone (PROMETRIUM) 100 MG capsule Take 100 mg by mouth daily.    . sertraline (ZOLOFT) 50 MG tablet Take 1 tablet (50 mg total) by mouth daily. 30 tablet 0  . Turmeric 500 MG CAPS Take by mouth.    . Vitamin D, Ergocalciferol, (DRISDOL) 1.25 MG (50000 UT) CAPS capsule Take 1 capsule (50,000 Units total) by mouth every 7 (seven) days. 4 capsule 0  . VITAMIN K PO Take 150 mcg by mouth daily.     No current facility-administered medications on file prior to visit.     PAST MEDICAL HISTORY: Past Medical History:  Diagnosis Date  . Anxiety   . GERD (gastroesophageal reflux disease)   . Infertility, female   . PCOS (polycystic ovarian  syndrome)   . Pre-diabetes   . Vaginismus   . Vitamin D deficiency     PAST SURGICAL HISTORY: History reviewed. No pertinent surgical history.  SOCIAL HISTORY: Social History   Tobacco Use  . Smoking status: Never Smoker  . Smokeless tobacco: Never Used  Substance Use Topics  . Alcohol use: No  . Drug use: No    FAMILY HISTORY: Family History  Problem Relation Age of Onset  . High blood pressure Mother   . Thyroid disease Mother   . Obesity Mother   . AAA (abdominal aortic aneurysm) Father     ROS: Review of Systems  Constitutional: Positive for malaise/fatigue. Negative for weight loss.  Cardiovascular: Negative for chest pain and claudication.  Gastrointestinal: Negative for nausea and vomiting.  Musculoskeletal: Negative for myalgias.       Negative muscle weakness    PHYSICAL EXAM: Pt in no acute distress  RECENT LABS AND TESTS:  BMET    Component Value Date/Time   NA 141 12/23/2017 1249   K 4.2 12/23/2017 1249   CL 103 12/23/2017 1249   CO2 23 12/23/2017 1249   GLUCOSE 72 12/23/2017 1249   GLUCOSE 110 (H) 08/04/2013 2248   BUN 9 12/23/2017 1249   CREATININE 0.81 12/23/2017 1249   CALCIUM 9.2 12/23/2017 1249   GFRNONAA 98 12/23/2017 1249   GFRAA 113 12/23/2017 1249   Lab Results  Component Value Date   HGBA1C 5.6 04/21/2018   HGBA1C 5.6 12/23/2017   Lab Results  Component Value Date   INSULIN 19.9 04/21/2018   INSULIN 13.4 12/23/2017   CBC    Component Value Date/Time   WBC 9.7 12/23/2017 1249   WBC 14.2 (H) 08/04/2013 2248   RBC 4.68 12/23/2017 1249   RBC 4.62 08/04/2013 2248   HGB 12.8 12/23/2017 1249   HCT 40.5 12/23/2017 1249   PLT 427 (H) 08/04/2013 2248   MCV 87 12/23/2017 1249   MCH 27.4 12/23/2017 1249   MCH 29.2 08/04/2013 2248   MCHC 31.6 12/23/2017 1249   MCHC 33.7 08/04/2013 2248   RDW 12.5 12/23/2017 1249   LYMPHSABS 2.6 12/23/2017 1249   MONOABS 0.8 08/04/2013 2239   EOSABS 0.1 12/23/2017 1249   BASOSABS 0.0  12/23/2017 1249   Iron/TIBC/Ferritin/ %Sat No results found for: IRON, TIBC, FERRITIN, IRONPCTSAT Lipid Panel     Component Value Date/Time   CHOL 174 04/21/2018 0856   TRIG 73 04/21/2018 0856   HDL 38 (L) 04/21/2018 0856   LDLCALC 121 (H) 04/21/2018 0856   Hepatic Function Panel     Component Value Date/Time   PROT 7.0 12/23/2017 1249   ALBUMIN 3.9 12/23/2017 1249   AST 14 12/23/2017 1249   ALT 12 12/23/2017 1249   ALKPHOS 93 12/23/2017 1249   BILITOT 0.3 12/23/2017 1249   BILIDIR <0.10 06/12/2016 1309      Component Value Date/Time   TSH 1.290 12/23/2017 1249      I, Burt Knack, am acting as transcriptionist for Debbra Riding, MD  I have reviewed the above documentation for accuracy and completeness, and I agree with the above. - Debbra Riding, MD

## 2018-07-20 NOTE — Telephone Encounter (Signed)
  Office: [phone]  /  Fax: [phone]  Date of Call: July 20, 2018 Time of Call: 9:42am Provider: Lawerance Cruel, PsyD  CONTENT: This provider called Seda to check-in and schedule an appointment, as the last appointment was canceled by Marcelle Smiling due to work. A HIPAA compliant voicemail was left requesting a call back.  PLAN: This provider will wait for Tenasha to call back or send a MyChart message to schedule an appointment.

## 2018-07-22 ENCOUNTER — Telehealth: Payer: 59 | Admitting: Family

## 2018-07-22 DIAGNOSIS — B353 Tinea pedis: Secondary | ICD-10-CM

## 2018-07-22 MED ORDER — FLUCONAZOLE 150 MG PO TABS: 150.0000 mg | ORAL_TABLET | ORAL | 0 refills | Status: DC

## 2018-07-22 NOTE — Progress Notes (Signed)
E-Visit for Athlete's Foot  We are sorry that you are not feeling well. Here is how we plan to help!  Based on what you shared with me it looks like you have tinea pedis, or "Athlete's Foot".  This type of rash can spread through shared towels, clothing, bedding, etc., as well as hard surfaces (particularly in moist areas) such as shower stalls, locker room floors, pool areas, etc. The symptoms of Athlete's Foot include red, swollen, peeling, itchy skin between the toes (especially between the pinky toe and the one next to it). The sole and heel of the foot may also be affected. In severe cases, the skin on the feet can blister.  Athlete's foot can usually be treated with over-the-counter topical antifungal products; but sometimes with chronic or extensive tinea pedis, prescription oral medications are needed.   I am recommending:Clotrimazole 1% cream or gel, apply to area twice per day, which is over the counter   Prescription medications are only indicated for an extensive rash or if over the counter treatments have failed.  I am prescribing:Fluconazole 150 mg once weekly for two to four weeks  HOME CARE:  . Keep feet clean, dry, and cool. . Avoid using swimming pools, public showers, or foot baths. . Wear sandals when possible or air shoes out by alternating them every 2-3 days. . Avoid wearing closed shoes and wearing socks made from fabric that doesn't dry easily (for example, nylon). . Treat the infection with recommended medication  GET HELP RIGHT AWAY IF:  . Symptoms that don't go away after treatment. . Severe itching that persists. . If your rash spreads or swells. . If your rash begins to have drainage or smell. . You develop a fever.  MAKE SURE YOU   . Understand these instructions. . Will watch your condition. . Will get help right away if you are not doing well or get worse.   Thank you for choosing an e-visit.  Your e-visit answers were reviewed by a  board certified advanced clinical practitioner to complete your personal care plan. Depending upon the condition, your plan could have included both over the counter or prescription medications.  Please review your pharmacy choice. Make sure the pharmacy is open so you can pick up prescription now. If there is a problem, you may contact your provider through [employer] and have the prescription routed to another pharmacy.  Your safety is important to [place]. If you have drug allergies check your prescription carefully.   For the next 24 hours you can use MyChart to ask questions about today's visit, request a non-urgent call back, or ask for a work or school excuse.  You will get an email in the next two days asking about your experience. I hope that your e-visit has been valuable and will speed your recovery  References or for more information:  [email or web address]&source=search_result&selectedTitle=1~104&usage_type=default&display_rank=1  [email or web address]

## 2018-08-03 ENCOUNTER — Ambulatory Visit (INDEPENDENT_AMBULATORY_CARE_PROVIDER_SITE_OTHER): Payer: 59 | Admitting: Family Medicine

## 2018-08-03 ENCOUNTER — Encounter (INDEPENDENT_AMBULATORY_CARE_PROVIDER_SITE_OTHER): Payer: Self-pay | Admitting: Family Medicine

## 2018-08-03 ENCOUNTER — Other Ambulatory Visit: Payer: Self-pay

## 2018-08-03 DIAGNOSIS — F418 Other specified anxiety disorders: Secondary | ICD-10-CM

## 2018-08-03 DIAGNOSIS — E669 Obesity, unspecified: Secondary | ICD-10-CM | POA: Diagnosis not present

## 2018-08-03 DIAGNOSIS — Z6833 Body mass index (BMI) 33.0-33.9, adult: Secondary | ICD-10-CM | POA: Diagnosis not present

## 2018-08-03 DIAGNOSIS — E559 Vitamin D deficiency, unspecified: Secondary | ICD-10-CM

## 2018-08-03 MED ORDER — VITAMIN D (ERGOCALCIFEROL) 1.25 MG (50000 UNIT) PO CAPS: 50000.0000 [IU] | ORAL_CAPSULE | ORAL | 0 refills | Status: DC

## 2018-08-03 MED ORDER — SERTRALINE HCL 50 MG PO TABS: 50.0000 mg | ORAL_TABLET | Freq: Every day | ORAL | 0 refills | Status: DC

## 2018-08-04 DIAGNOSIS — F430 Acute stress reaction: Secondary | ICD-10-CM | POA: Diagnosis not present

## 2018-08-08 NOTE — Progress Notes (Signed)
Office: [phone]  /  Fax: [phone] TeleHealth Visit:  Cynthia Figueroa has verbally consented to this TeleHealth visit today. The patient is located at home, the provider is located at the [facility] office. The participants in this visit include the listed provider and patient. The visit was conducted today via face time.  HPI:   Chief Complaint: OBESITY Cynthia Figueroa is here to discuss her progress with her obesity treatment plan. She is on the lower carbohydrate, vegetable and lean protein rich diet plan and is following her eating plan approximately 50 % of the time. She states she is doing T25 for 25 minutes 2-3 times per week. Cynthia Figueroa is feeling very hungry even after meals. She denies feeling of satiation after meals. She is not sure if it's hunger or boredom. She did feel the low carbohydrate plan gave her opportunity to eat more.  We were unable to weigh the patient today for this TeleHealth visit. She feels as if she has gained 2 lbs since her last visit. She has lost 4 lbs since starting treatment with [place].  Vitamin D Deficiency Jalen has a diagnosis of vitamin D deficiency. She is currently taking prescription Vit D and denies nausea, vomiting or muscle weakness.  Depression with Anxiety Cynthia Figueroa has a diagnosis of depression with anxiety. She is noticing improvement in symptoms. She shows no sign of suicidal or homicidal ideations.  ASSESSMENT AND PLAN:  Vitamin D deficiency - Plan: Vitamin D, Ergocalciferol, (DRISDOL) 1.25 MG (50000 UT) CAPS capsule  Depression with anxiety - Plan: sertraline (ZOLOFT) 50 MG tablet  Class 1 obesity with serious comorbidity and body mass index (BMI) of 33.0 to 33.9 in adult, unspecified obesity type  PLAN:  Vitamin D Deficiency Cynthya was informed that low vitamin D levels contributes to fatigue and are associated with obesity, breast, and colon cancer. Cynthia Figueroa agrees to continue taking prescription Vit D @50 ,000 IU every  week #4 and we will refill for 1 month. She will follow up for routine testing of vitamin D, at least 2-3 times per year. She was informed of the risk of over-replacement of vitamin D and agrees to not increase her dose unless she discusses this with [place] first. Cynthia Figueroa agrees to follow up with our clinic in 2 weeks.  Depression with Anxiety We discussed behavior modification techniques today to help Cynthia Figueroa deal with her depression and anxiety. Cynthia Figueroa agrees to continue taking Zoloft 50 mg PO daily #30 and we will refill for 1 month. Swannie agrees to follow up with our clinic in 2 weeks.  Obesity [name] is currently in the action stage of change. As such, her goal is to continue with weight loss efforts She has agreed to follow a lower carbohydrate, vegetable and lean protein rich diet plan [name] has been instructed to work up to a goal of 150 minutes of combined cardio and strengthening exercise per week or continue working out 3-4 times per week with T25 for weight loss and overall health benefits. We discussed the following Behavioral Modification Strategies today: increasing lean protein intake, work on meal planning and easy cooking plans, emotional eating strategies, no skipping meals, better snacking choices, and planning for success   Cynthia Figueroa has agreed to follow up with our clinic in 2 weeks. She was informed of the importance of frequent follow up visits to maximize her success with intensive lifestyle modifications for her multiple health conditions.  ALLERGIES: Allergies  Allergen Reactions  . Lamisil [Terbinafine] Hives    MEDICATIONS:  Current Outpatient Medications on File Prior to Visit  Medication Sig Dispense Refill  . ASHWAGANDHA PO Take 300 mg by mouth daily.    . Cholecalciferol (VITAMIN D3) 2000 units CHEW Chew by mouth.    . Cranberry 500 MG CAPS Take by mouth.    . dimenhyDRINATE (DRAMAMINE) 50 MG tablet Take 1 tablet (50 mg total) by mouth every 8 (eight) hours  as needed. 30 tablet 0  . fluconazole (DIFLUCAN) 150 MG tablet Take 1 tablet (150 mg total) by mouth once a week. 4 tablet 0  . IRON PO Take 1 tablet by mouth daily.    Cynthia Figueroa. itraconazole (SPORANOX) 100 MG capsule Take 1 capsule (100 mg total) by mouth daily. 90 capsule 0  . Cynthia Figueroa (VICTOZA) 18 MG/3ML SOPN Inject 0.1 mLs (0.6 mg total) into the skin every morning. 1 pen 0  . Cynthia Figueroa -Weight Management (Cynthia Figueroa) 18 MG/3ML SOPN Inject 0.6 mg into the skin daily. 1 pen 0  . MAGNESIUM PO Take 1 tablet by mouth daily.    . meclizine (ANTIVERT) 25 MG tablet Take 1 tablet (25 mg total) by mouth 3 (three) times daily as needed for dizziness. 30 tablet 0  . metFORMIN (GLUCOPHAGE) 500 MG tablet Take 1 tablet (500 mg total) by mouth daily with breakfast. 30 tablet 0  . Multiple Vitamins-Minerals (MULTIVITAMIN PO) Take by mouth.    . NON FORMULARY Lemon balm herb  330 mg /mL qd prn    . Probiotic Product (PROBIOTIC DAILY PO) Take by mouth.    . progesterone (PROMETRIUM) 100 MG capsule Take 100 mg by mouth daily.    . Turmeric 500 MG CAPS Take by mouth.    Cynthia Figueroa. VITAMIN K PO Take 150 mcg by mouth daily.     No current facility-administered medications on file prior to visit.     PAST MEDICAL HISTORY: Past Medical History:  Diagnosis Date  . Anxiety   . GERD (gastroesophageal reflux disease)   . Infertility, female   . PCOS (polycystic ovarian syndrome)   . Pre-diabetes   . Vaginismus   . Vitamin D deficiency     PAST SURGICAL HISTORY: History reviewed. No pertinent surgical history.  SOCIAL HISTORY: Social History   Tobacco Use  . Smoking status: Never Smoker  . Smokeless tobacco: Never Used  Substance Use Topics  . Alcohol use: No  . Drug use: No    FAMILY HISTORY: Family History  Problem Relation Age of Onset  . High blood pressure Mother   . Thyroid disease Mother   . Obesity Mother   . AAA (abdominal aortic aneurysm) Father     ROS: Review of Systems  Constitutional:  Positive for malaise/fatigue. Negative for weight loss.  Gastrointestinal: Negative for nausea and vomiting.  Musculoskeletal:       Negative muscle weakness  Psychiatric/Behavioral: Positive for depression. Negative for suicidal ideas.       + Anxiety    PHYSICAL EXAM: Pt in no acute distress  RECENT LABS AND TESTS: BMET    Component Value Date/Time   NA 141 12/23/2017 1249   K 4.2 12/23/2017 1249   CL 103 12/23/2017 1249   CO2 23 12/23/2017 1249   GLUCOSE 72 12/23/2017 1249   GLUCOSE 110 (H) 08/04/2013 2248   BUN 9 12/23/2017 1249   CREATININE 0.81 12/23/2017 1249   CALCIUM 9.2 12/23/2017 1249   GFRNONAA 98 12/23/2017 1249   GFRAA 113 12/23/2017 1249   Lab Results  Component Value Date  HGBA1C 5.6 04/21/2018   HGBA1C 5.6 12/23/2017   Lab Results  Component Value Date   INSULIN 19.9 04/21/2018   INSULIN 13.4 12/23/2017   CBC    Component Value Date/Time   WBC 9.7 12/23/2017 1249   WBC 14.2 (H) 08/04/2013 2248   RBC 4.68 12/23/2017 1249   RBC 4.62 08/04/2013 2248   HGB 12.8 12/23/2017 1249   HCT 40.5 12/23/2017 1249   PLT 427 (H) 08/04/2013 2248   MCV 87 12/23/2017 1249   MCH 27.4 12/23/2017 1249   MCH 29.2 08/04/2013 2248   MCHC 31.6 12/23/2017 1249   MCHC 33.7 08/04/2013 2248   RDW 12.5 12/23/2017 1249   LYMPHSABS 2.6 12/23/2017 1249   MONOABS 0.8 08/04/2013 2239   EOSABS 0.1 12/23/2017 1249   BASOSABS 0.0 12/23/2017 1249   Iron/TIBC/Ferritin/ %Sat No results found for: IRON, TIBC, FERRITIN, IRONPCTSAT Lipid Panel     Component Value Date/Time   CHOL 174 04/21/2018 0856   TRIG 73 04/21/2018 0856   HDL 38 (L) 04/21/2018 0856   LDLCALC 121 (H) 04/21/2018 0856   Hepatic Function Panel     Component Value Date/Time   PROT 7.0 12/23/2017 1249   ALBUMIN 3.9 12/23/2017 1249   AST 14 12/23/2017 1249   ALT 12 12/23/2017 1249   ALKPHOS 93 12/23/2017 1249   BILITOT 0.3 12/23/2017 1249   BILIDIR <0.10 06/12/2016 1309      Component Value  Date/Time   TSH 1.290 12/23/2017 1249      I, Burt Knack, am acting as transcriptionist for Debbra Riding, MD I have reviewed the above documentation for accuracy and completeness, and I agree with the above. - Debbra Riding, MD

## 2018-08-09 ENCOUNTER — Telehealth (INDEPENDENT_AMBULATORY_CARE_PROVIDER_SITE_OTHER): Payer: Self-pay | Admitting: Psychology

## 2018-08-09 NOTE — Telephone Encounter (Signed)
  Office: [phone]  /  Fax: [phone]  Date of Call: August 09, 2018 Time of Call: 3:44pm Duration of Call: 3 minutes Provider: Lawerance Cruel, PsyD  CONTENT: This provider called Jaily again to check-in and schedule an appointment, as the last appointment was canceled by Marcelle Smiling on July 07, 2018 due to work. She was receptive to scheduling an appointment; therefore, this provider confirmed Alice's e-mail address and provided instructions for WebEx appointments. This provider explained an e-mail will be sent with a secure link for the upcoming appointment, and the e-mail will also have an informed consent document for telepsychological services. This provider requested it be completed and returned. Arayna verbally acknowledged understanding that sending the consent document via a MyChart message will make it part of her medical record; therefore, visible to all providers. A brief risk assessment was completed. Sukhdeep denied experiencing suicidal and homicidal ideation, plan, and intent since the last appointment with this provider.   PLAN: Oluwatosin is scheduled for a follow-up appointment via WebEx on Aug 17, 2018 at 11:00am.

## 2018-08-11 DIAGNOSIS — F430 Acute stress reaction: Secondary | ICD-10-CM | POA: Diagnosis not present

## 2018-08-16 ENCOUNTER — Encounter (INDEPENDENT_AMBULATORY_CARE_PROVIDER_SITE_OTHER): Payer: Self-pay | Admitting: Family Medicine

## 2018-08-16 ENCOUNTER — Ambulatory Visit (INDEPENDENT_AMBULATORY_CARE_PROVIDER_SITE_OTHER): Payer: 59 | Admitting: Family Medicine

## 2018-08-16 ENCOUNTER — Other Ambulatory Visit: Payer: Self-pay

## 2018-08-16 DIAGNOSIS — E8881 Metabolic syndrome: Secondary | ICD-10-CM | POA: Diagnosis not present

## 2018-08-16 DIAGNOSIS — Z6833 Body mass index (BMI) 33.0-33.9, adult: Secondary | ICD-10-CM | POA: Diagnosis not present

## 2018-08-16 DIAGNOSIS — E559 Vitamin D deficiency, unspecified: Secondary | ICD-10-CM

## 2018-08-16 DIAGNOSIS — E669 Obesity, unspecified: Secondary | ICD-10-CM

## 2018-08-17 ENCOUNTER — Other Ambulatory Visit: Payer: Self-pay

## 2018-08-17 ENCOUNTER — Ambulatory Visit (INDEPENDENT_AMBULATORY_CARE_PROVIDER_SITE_OTHER): Payer: 59 | Admitting: Psychology

## 2018-08-17 DIAGNOSIS — F3289 Other specified depressive episodes: Secondary | ICD-10-CM | POA: Diagnosis not present

## 2018-08-17 NOTE — Progress Notes (Signed)
Office: [phone]  /  Fax: [phone]    Date: Aug 17, 2018   Appointment Start Time: 11:05am Duration: 22 minutes Provider: Lawerance CruelGaytri Jammie Troup, Psy.D. Type of Session: Individual Therapy  [place]  Location of Provider: Provider's Home Type of Contact: Telepsychological Visit via [employer] Web[employer]   Session Content: This provider called Neal at 11:02am as she did not present for the WebEx appointment. She noted she forgot about the appointment, but was able to join the WebEx appointment. As such, the appointment was initiated 5 minutes late. Prior to proceeding with today's appointment, Cynthia Figueroa's physical location at the time of this appointment was obtained. Cynthia Figueroa reported she was in her car headed to a store ([employer]) and provided the address. Notably, she shared her husband was in the car with her and driving. She reported she did not have a problem with him listening to today's appointment, and acknowledged understanding about how the aforementioned impacts confidentiality. However, shortly after the appointment was initiated, the car was stopped and her husband went into the store. It was recommended headphones be utilized for the duration of the appointment for confidentiality reasons. Cynthia Figueroa agreed and was observed wearing headphones for the duration of the appointment. Her husband re-joined her in the car for the last 7 minutes of today's appointment. In the event of technical difficulties, Cynthia Figueroa shared a phone number she could be reached at. Cynthia Figueroa and this provider participated in today's telepsychological service. Also, Cynthia Figueroa denied anyone else being present on the WebEx appointment.  Prior to initiating telepsychological services, Cynthia Figueroa was provided with an informed consent document via e-mail, which included the development of a safety plan (I.e., an emergency contact and emergency resources) in the event of an emergency/crisis. Cynthia Figueroa was unable to  complete the form and return it to this provider prior to today's appointment. As such, this provider verbally discussed the consent form during today's appointment. Cynthia Figueroa provided an emergency contact, and verbally acknowledged understanding that she is ultimately responsible for understanding her insurance benefits as it relates to reimbursement of telepsychological services. This provider also reviewed confidentiality, as it relates to telepsychological services, as well as the rationale for telepsychological services. More specifically, this provider's clinic is closed for in-person visits due to COVID-19. Therapeutic services will resume to in-person appointments once the clinic re-opens. Cynthia Figueroa expressed understanding regarding the rationale for telepsychological services. In addition, this provider explained the telepsychological services informed consent document would be considered an addendum to the initial consent document. Cynthia Figueroa verbally consented to proceed and indicated she would return the completed consent form.   Cynthia Figueroa is a 33 y.o. female presenting via [employer] WebEx for a follow-up appointment to address the previously established treatment goal of decreasing emotional eating. This provider conducted a brief check-in and verbally administered the PHQ-9 and GAD-7 after reviewing the abovementioned. 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 When she spoke with Dr. Rinaldo RatelKadolph at their last appointment, they reportedly discussed Cynthia Figueroa can eat "as much as she wants" on the low carb plan. Additionally, Cynthia Figueroa reported a reduction in emotional eating since the last appointment with this provider and acknowledged an increased awareness in hunger patterns.   Since the onset of treatment with this provider, Cynthia Figueroa discussed having "a little hiccup"  with her husband, but she shared they were able to talk about it.  The aforementioned has reportedly contributed to her being able to take care of herself more. Remainder of the appointment focused on mindfulness. Correy acknowledged she did not engage in mindfulness since the last appointment with this provider. However, she discussed engaging in thought defusion as needed, which she described as helpful. As such, mindfulness was reviewed and this provider discussed the utilization of YouTube for mindfulness exercises, specifically videos by Rhae Hammock. This provider explored barriers/obstacles that may prevent Cynthia Figueroa from engaging in mindfulness exercises between now and the next appointment. She identified her self. This was explored further and this provider and Tamerra determined the morning would be the best time for her to engage in a formal mindfulness exercise (I.e., a video by Rhae Hammock.) Furthermore, continuing treatment with this provider was discussed. Cynthia Figueroa noted she has an appointment on Aug 29, 2018 with a new provider based on the referral placed, but was receptive to meeting with this provider for additional appointments until rapport is established with the new provider. Overall, Cynthia Figueroa was receptive to today's session as evidenced by openness to sharing, responsiveness to feedback, and willingness to engage in mindfulness to assist with coping.  Mental Status Examination:  Appearance: neat Behavior: cooperative Mood: euthymic Affect: mood congruent Speech: normal in rate, volume, and tone Eye Contact: appropriate Psychomotor Activity: appropriate Thought Process: linear, logical, and goal directed  Content/Perceptual Disturbances: denies suicidal and homicidal ideation, plan, and intent and no hallucinations, delusions, bizarre thinking or behavior reported or observed Orientation: time, person, place and purpose of appointment Cognition/Sensorium: memory, attention,  language, and fund of knowledge intact  Insight: good Judgment: good  Structured Assessment Results: The Patient Health Questionnaire-9 (PHQ-9) is a self-report measure that assesses symptoms and severity of depression over the course of the last two weeks. Maisen obtained a score of 4 suggesting minimal depression. Shanaya finds the endorsed symptoms to be not difficult at all. Depression screen [facility] 2/9 08/17/2018  Decreased Interest 0  Down, Depressed, Hopeless 0  PHQ - 2 Score 0  Altered sleeping 0  Tired, decreased energy 3  Change in appetite 0  Feeling bad or failure about yourself  1  Trouble concentrating 0  Moving slowly or fidgety/restless 0  Suicidal thoughts 0  PHQ-9 Score 4  Difficult doing work/chores -   The Generalized Anxiety Disorder-7 (GAD-7) is a brief self-report measure that assesses symptoms of anxiety over the course of the last two weeks. Happy obtained a score of 6 suggesting mild anxiety. GAD 7 : Generalized Anxiety Score 08/17/2018  Nervous, Anxious, on Edge 1  Control/stop worrying 0  Worry too much - different things 3  Trouble relaxing 1  Restless 0  Easily annoyed or irritable 1  Afraid - awful might happen 0  Total GAD 7 Score 6  Anxiety Difficulty Not difficult at all   Interventions:  Administered PHQ-9 and GAD-7 for symptom monitoring Reviewed content from the previous session Provided empathic reflections and validation Psychoeducation provided regarding mindfulness Discussed termination planning Provided positive reinforcement Conducted a brief chart review  DSM-5 Diagnosis: 311 (F32.8) Other Specified Depressive Disorder, Emotional Eating Behaviors  Treatment Goal & Progress: During the initial appointment with this provider, the following treatment goal was established: decrease emotional eating. Yukta has demonstrated progress in her goal as evidenced by increased awareness of hunger patterns and triggers for emotional eating. She  noted a reduction in emotional eating since the last appointment with this provider, and discussed engaging in learned skills to assist with coping.  Plan: Alley continues to appear able and willing to participate as evidenced by engagement in reciprocal conversation, and asking questions for clarification as appropriate. The next appointment will be scheduled in two weeks, which will be via [employer]. Once this provider's office resumes in-person appointments, Zan will be notified. The next session will focus further on mindfulness.

## 2018-08-17 NOTE — Progress Notes (Signed)
Office: [phone]  /  Fax: [phone] TeleHealth Visit:  Cynthia Figueroa has verbally consented to this TeleHealth visit today. The patient is located at home, the provider is located at the [facility] office. The participants in this visit include the listed provider and patient. The visit was conducted today via face time.  HPI:   Chief Complaint: OBESITY Cynthia Figueroa is here to discuss her progress with her obesity treatment plan. She is on the lower carbohydrate, vegetable and lean protein rich diet plan and is following her eating plan approximately 80 % of the time. She states she is exercising 0 minutes 0 times per week. Cynthia Figueroa has lost 5 lbs since her last visit. She has been able to discipline herself to limit carbohydrate intake to dark chocolate and sugar free Jello. She is supplementing income with making masks.  We were unable to weigh the patient today for this TeleHealth visit. She feels as if she has lost 5 lbs since her last visit. She has lost 4-9 lbs since starting treatment with us.  Vitamin D Deficiency Cynthia Figueroa has a diagnosis of vitamin D deficiency. She is currently taking prescription Vit D. She notes fatigue and denies nausea, vomiting or muscle weakness.  Insulin Resistance Cynthia Figueroa has a diagnosis of insulin resistance based on her elevated fasting insulin level >5. Although Cynthia Figueroa's blood glucose readings are still under good control, insulin resistance puts her at greater risk of metabolic syndrome and diabetes. She is on the low carbohydrate and is able to follow the plan relatively easily. She restarted metformin and continues to work on diet and exercise to decrease risk of diabetes.   ASSESSMENT AND PLAN:  Vitamin D deficiency  Insulin resistance  Class 1 obesity with serious comorbidity and body mass index (BMI) of 33.0 to 33.9 in adult, unspecified obesity type  PLAN:  Vitamin D Deficiency Cynthia Figueroa was informed that low vitamin D levels  contributes to fatigue and are associated with obesity, breast, and colon cancer. Cynthia Figueroa agrees to continue taking prescription Vit D @50 ,000 IU every week, no refill needed. She will follow up for routine testing of vitamin D, at least 2-3 times per year. She was informed of the risk of over-replacement of vitamin D and agrees to not increase her dose unless she discusses this with us first. Cynthia Figueroa agrees to follow up with our clinic in 2 weeks.  Insulin Resistance Cynthia Figueroa will continue to work on weight loss, exercise, and decreasing simple carbohydrates in her diet to help decrease the risk of diabetes. We dicussed metformin including benefits and risks. She was informed that eating too many simple carbohydrates or too many calories at one sitting increases the likelihood of GI side effects. Cynthia Figueroa agrees to continue taking metformin, no refill needed. Cynthia Figueroa agrees to follow up with our clinic in 2 weeks as directed to monitor her progress.  Obesity Cynthia Figueroa is currently in the action stage of change. As such, her goal is to continue with weight loss efforts She has agreed to follow a lower carbohydrate, vegetable and lean protein rich diet plan Cynthia Figueroa has been instructed to work up to a goal of 150 minutes of combined cardio and strengthening exercise per week for weight loss and overall health benefits. We discussed the following Behavioral Modification Strategies today: increasing lean protein intake, increasing vegetables and work on meal planning and easy cooking plans, keeping healthy foods in the home, and planning for success   Cynthia Figueroa has agreed to follow up with our clinic  in 2 weeks. She was informed of the importance of frequent follow up visits to maximize her success with intensive lifestyle modifications for her multiple health conditions.  ALLERGIES: Allergies  Allergen Reactions  . Lamisil [Terbinafine] Hives    MEDICATIONS: Current Outpatient Medications on File Prior  to Visit  Medication Sig Dispense Refill  . ASHWAGANDHA PO Take 300 mg by mouth daily.    . Cholecalciferol (VITAMIN D3) 2000 units CHEW Chew by mouth.    . Cranberry 500 MG CAPS Take by mouth.    . dimenhyDRINATE (DRAMAMINE) 50 MG tablet Take 1 tablet (50 mg total) by mouth every 8 (eight) hours as needed. 30 tablet 0  . fluconazole (DIFLUCAN) 150 MG tablet Take 1 tablet (150 mg total) by mouth once a week. 4 tablet 0  . IRON PO Take 1 tablet by mouth daily.    Marland Kitchen itraconazole (SPORANOX) 100 MG capsule Take 1 capsule (100 mg total) by mouth daily. 90 capsule 0  . liraglutide (VICTOZA) 18 MG/3ML SOPN Inject 0.1 mLs (0.6 mg total) into the skin every morning. 1 pen 0  . Liraglutide -Weight Management (SAXENDA) 18 MG/3ML SOPN Inject 0.6 mg into the skin daily. 1 pen 0  . MAGNESIUM PO Take 1 tablet by mouth daily.    . meclizine (ANTIVERT) 25 MG tablet Take 1 tablet (25 mg total) by mouth 3 (three) times daily as needed for dizziness. 30 tablet 0  . metFORMIN (GLUCOPHAGE) 500 MG tablet Take 1 tablet (500 mg total) by mouth daily with breakfast. 30 tablet 0  . Multiple Vitamins-Minerals (MULTIVITAMIN PO) Take by mouth.    . NON FORMULARY Lemon balm herb  330 mg /mL qd prn    . Probiotic Product (PROBIOTIC DAILY PO) Take by mouth.    . progesterone (PROMETRIUM) 100 MG capsule Take 100 mg by mouth daily.    . sertraline (ZOLOFT) 50 MG tablet Take 1 tablet (50 mg total) by mouth daily. 30 tablet 0  . Turmeric 500 MG CAPS Take by mouth.    . Vitamin D, Ergocalciferol, (DRISDOL) 1.25 MG (50000 UT) CAPS capsule Take 1 capsule (50,000 Units total) by mouth every 7 (seven) days. 4 capsule 0  . VITAMIN K PO Take 150 mcg by mouth daily.     No current facility-administered medications on file prior to visit.     PAST MEDICAL HISTORY: Past Medical History:  Diagnosis Date  . Anxiety   . GERD (gastroesophageal reflux disease)   . Infertility, female   . PCOS (polycystic ovarian syndrome)   .  Pre-diabetes   . Vaginismus   . Vitamin D deficiency     PAST SURGICAL HISTORY: History reviewed. No pertinent surgical history.  SOCIAL HISTORY: Social History   Tobacco Use  . Smoking status: Never Smoker  . Smokeless tobacco: Never Used  Substance Use Topics  . Alcohol use: No  . Drug use: No    FAMILY HISTORY: Family History  Problem Relation Age of Onset  . High blood pressure Mother   . Thyroid disease Mother   . Obesity Mother   . AAA (abdominal aortic aneurysm) Father     ROS: Review of Systems  Constitutional: Positive for malaise/fatigue and weight loss.  Gastrointestinal: Negative for nausea and vomiting.  Musculoskeletal:       Negative muscle weakness    PHYSICAL EXAM: Pt in no acute distress  RECENT LABS AND TESTS: BMET    Component Value Date/Time   NA 141 12/23/2017 1249  K 4.2 12/23/2017 1249   CL 103 12/23/2017 1249   CO2 23 12/23/2017 1249   GLUCOSE 72 12/23/2017 1249   GLUCOSE 110 (H) 08/04/2013 2248   BUN 9 12/23/2017 1249   CREATININE 0.81 12/23/2017 1249   CALCIUM 9.2 12/23/2017 1249   GFRNONAA 98 12/23/2017 1249   GFRAA 113 12/23/2017 1249   Lab Results  Component Value Date   HGBA1C 5.6 04/21/2018   HGBA1C 5.6 12/23/2017   Lab Results  Component Value Date   INSULIN 19.9 04/21/2018   INSULIN 13.4 12/23/2017   CBC    Component Value Date/Time   WBC 9.7 12/23/2017 1249   WBC 14.2 (H) 08/04/2013 2248   RBC 4.68 12/23/2017 1249   RBC 4.62 08/04/2013 2248   HGB 12.8 12/23/2017 1249   HCT 40.5 12/23/2017 1249   PLT 427 (H) 08/04/2013 2248   MCV 87 12/23/2017 1249   MCH 27.4 12/23/2017 1249   MCH 29.2 08/04/2013 2248   MCHC 31.6 12/23/2017 1249   MCHC 33.7 08/04/2013 2248   RDW 12.5 12/23/2017 1249   LYMPHSABS 2.6 12/23/2017 1249   MONOABS 0.8 08/04/2013 2239   EOSABS 0.1 12/23/2017 1249   BASOSABS 0.0 12/23/2017 1249   Iron/TIBC/Ferritin/ %Sat No results found for: IRON, TIBC, FERRITIN, IRONPCTSAT Lipid Panel      Component Value Date/Time   CHOL 174 04/21/2018 0856   TRIG 73 04/21/2018 0856   HDL 38 (L) 04/21/2018 0856   LDLCALC 121 (H) 04/21/2018 0856   Hepatic Function Panel     Component Value Date/Time   PROT 7.0 12/23/2017 1249   ALBUMIN 3.9 12/23/2017 1249   AST 14 12/23/2017 1249   ALT 12 12/23/2017 1249   ALKPHOS 93 12/23/2017 1249   BILITOT 0.3 12/23/2017 1249   BILIDIR <0.10 06/12/2016 1309      Component Value Date/Time   TSH 1.290 12/23/2017 1249      I, Burt Knack, am acting as transcriptionist for Debbra Riding, MD   I have reviewed the above documentation for accuracy and completeness, and I agree with the above. - Debbra Riding, MD

## 2018-08-23 DIAGNOSIS — N970 Female infertility associated with anovulation: Secondary | ICD-10-CM | POA: Diagnosis not present

## 2018-08-23 DIAGNOSIS — N942 Vaginismus: Secondary | ICD-10-CM | POA: Diagnosis not present

## 2018-08-23 DIAGNOSIS — Z3161 Procreative counseling and advice using natural family planning: Secondary | ICD-10-CM | POA: Diagnosis not present

## 2018-08-23 DIAGNOSIS — E282 Polycystic ovarian syndrome: Secondary | ICD-10-CM | POA: Diagnosis not present

## 2018-08-24 MED FILL — DIAZEPAM 10 MG TABS: 10 | 1 days supply | Qty: 1 | Fill #0

## 2018-08-24 MED FILL — traMADol HCL 50 MG TABS: 50 | 1 days supply | Qty: 2 | Fill #0

## 2018-08-24 MED FILL — LETROZOLE 2.5 MG TABLET: 2.5 | 30 days supply | Qty: 15 | Fill #0

## 2018-08-24 MED FILL — OVIDREL 250 MCG/0.5 ML SYRG: 250 | 30 days supply | Qty: 1 | Fill #0

## 2018-08-24 MED FILL — metFORMIN HCL 1000 MG TABS: 1000 | 30 days supply | Qty: 60 | Fill #0

## 2018-08-29 ENCOUNTER — Ambulatory Visit: Payer: 59 | Admitting: Psychology

## 2018-08-30 ENCOUNTER — Encounter (INDEPENDENT_AMBULATORY_CARE_PROVIDER_SITE_OTHER): Payer: Self-pay | Admitting: Family Medicine

## 2018-08-30 ENCOUNTER — Ambulatory Visit (INDEPENDENT_AMBULATORY_CARE_PROVIDER_SITE_OTHER): Payer: 59 | Admitting: Family Medicine

## 2018-08-30 ENCOUNTER — Other Ambulatory Visit: Payer: Self-pay

## 2018-08-30 DIAGNOSIS — F418 Other specified anxiety disorders: Secondary | ICD-10-CM

## 2018-08-30 DIAGNOSIS — E8881 Metabolic syndrome: Secondary | ICD-10-CM | POA: Diagnosis not present

## 2018-08-30 DIAGNOSIS — E669 Obesity, unspecified: Secondary | ICD-10-CM | POA: Diagnosis not present

## 2018-08-30 DIAGNOSIS — E88819 Insulin resistance, unspecified: Secondary | ICD-10-CM

## 2018-08-30 DIAGNOSIS — Z6833 Body mass index (BMI) 33.0-33.9, adult: Secondary | ICD-10-CM | POA: Diagnosis not present

## 2018-08-30 DIAGNOSIS — E559 Vitamin D deficiency, unspecified: Secondary | ICD-10-CM | POA: Diagnosis not present

## 2018-08-30 NOTE — Progress Notes (Signed)
Office: [phone]  /  Fax: [phone] TeleHealth Visit:  Cynthia Figueroa has verbally consented to this TeleHealth visit today. The patient is located at home, the provider is located at the [facility] office. The participants in this visit include the listed provider and patient. The visit was conducted today via face time.  HPI:   Chief Complaint: OBESITY Cynthia Figueroa is here to discuss her progress with her obesity treatment plan. She is on the lower carbohydrate, vegetable and lean protein rich diet plan and is following her eating plan approximately 50 % of the time. She states she is exercising 0 minutes 0 times per week. Cynthia Figueroa reports a weight of 184 lbs today. She struggled to follow the plan the past few weeks. She did see a fertility specialist in the past few weeks. She is making masks for supplemental income. She does see difficulty maintaining the low carbohydrate secondary to cravings. Her last blood pressure in the office was 126/81 and blood sugar of 109. We were unable to weigh the patient today for this TeleHealth visit. She feels as if she has lost 1 lb since her last visit. She has lost 4-10 lbs since starting treatment with [place].  Insulin Resistance Cynthia Figueroa has a diagnosis of insulin resistance based on her elevated fasting insulin level >5. Although Cynthia Figueroa's blood glucose readings are still under good control, insulin resistance puts her at greater risk of metabolic syndrome and diabetes. She had a recent increase of metformin to 500 mg BID with a goal to get up to 1,000 mg BID. She notes increase in hunger. She continues to work on diet and exercise to decrease risk of diabetes.  Vitamin D Deficiency Cynthia Figueroa has a diagnosis of vitamin D deficiency. She is currently taking prescription Vit D. She notes fatigue and denies nausea, vomiting or muscle weakness.  Depression with Anxiety Cynthia Figueroa struggles with depression and anxiety. She notes symptoms are better  controlled. She shows no sign of suicidal or homicidal ideations.  ASSESSMENT AND PLAN:  Insulin resistance  Vitamin D deficiency - Plan: Vitamin D, Ergocalciferol, (DRISDOL) 1.25 MG (50000 UT) CAPS capsule  Depression with anxiety - Plan: sertraline (ZOLOFT) 50 MG tablet  Class 1 obesity with serious comorbidity and body mass index (BMI) of 33.0 to 33.9 in adult, unspecified obesity type  PLAN:  Insulin Resistance Cynthia Figueroa will continue to work on weight loss, exercise, and decreasing simple carbohydrates in her diet to help decrease the risk of diabetes. We dicussed metformin including benefits and risks. She was informed that eating too many simple carbohydrates or too many calories at one sitting increases the likelihood of GI side effects. Cynthia Figueroa agrees to change to metformin 1,000 mg BID. Cynthia Figueroa agrees to follow up with our clinic in 2 weeks as directed to monitor her progress.  Vitamin D Deficiency Cynthia Figueroa was informed that low vitamin D levels contributes to fatigue and are associated with obesity, breast, and colon cancer. Cynthia Figueroa agrees to continue taking prescription Vit D ,000 IU every week #4 and we will refill for 1 month. She will follow up for routine testing of vitamin D, at least 2-3 times per year. She was informed of the risk of over-replacement of vitamin D and agrees to not increase her dose unless she discusses this with [place] first. Cynthia Figueroa agrees to follow up with our clinic in 2 weeks.  Depression with Anxiety We discussed behavior modification techniques today to help Cynthia Figueroa deal with her depression and anxiety. Cynthia Figueroa agrees to continue  taking Zoloft 50 mg PO daily #30 and we will refill for 1 month. Cynthia Figueroa agrees to follow up with our clinic in 2 weeks.  Obesity Cynthia Figueroa is currently in the action stage of change. As such, her goal is to continue with weight loss efforts She has agreed to follow the Category 2 plan + 100 calories if needed Cynthia Figueroa has been  instructed to work up to a goal of 150 minutes of combined cardio and strengthening exercise per week for weight loss and overall health benefits. We discussed the following Behavioral Modification Strategies today: increasing lean protein intake, increasing vegetables and work on meal planning and easy cooking plans, keeping healthy foods in the home, and planning for success   Cynthia Figueroa has agreed to follow up with our clinic in 2 weeks. She was informed of the importance of frequent follow up visits to maximize her success with intensive lifestyle modifications for her multiple health conditions.  ALLERGIES: Allergies  Allergen Reactions  . Lamisil [Terbinafine] Hives    MEDICATIONS: Current Outpatient Medications on File Prior to Visit  Medication Sig Dispense Refill  . ASHWAGANDHA PO Take 300 mg by mouth daily.    . Cholecalciferol (VITAMIN D3) 2000 units CHEW Chew by mouth.    . Cranberry 500 MG CAPS Take by mouth.    . dimenhyDRINATE (DRAMAMINE) 50 MG tablet Take 1 tablet (50 mg total) by mouth every 8 (eight) hours as needed. 30 tablet 0  . fluconazole (DIFLUCAN) 150 MG tablet Take 1 tablet (150 mg total) by mouth once a week. 4 tablet 0  . IRON PO Take 1 tablet by mouth daily.    Marland Kitchen. itraconazole (SPORANOX) 100 MG capsule Take 1 capsule (100 mg total) by mouth daily. 90 capsule 0  . liraglutide (VICTOZA) 18 MG/3ML SOPN Inject 0.1 mLs (0.6 mg total) into the skin every morning. 1 pen 0  . Liraglutide -Weight Management (SAXENDA) 18 MG/3ML SOPN Inject 0.6 mg into the skin daily. 1 pen 0  . MAGNESIUM PO Take 1 tablet by mouth daily.    . meclizine (ANTIVERT) 25 MG tablet Take 1 tablet (25 mg total) by mouth 3 (three) times daily as needed for dizziness. 30 tablet 0  . metFORMIN (GLUCOPHAGE) 500 MG tablet Take 1 tablet (500 mg total) by mouth daily with breakfast. 30 tablet 0  . Multiple Vitamins-Minerals (MULTIVITAMIN PO) Take by mouth.    . NON FORMULARY Lemon balm herb  330 mg /mL qd  prn    . Probiotic Product (PROBIOTIC DAILY PO) Take by mouth.    . progesterone (PROMETRIUM) 100 MG capsule Take 100 mg by mouth daily.    . sertraline (ZOLOFT) 50 MG tablet Take 1 tablet (50 mg total) by mouth daily. 30 tablet 0  . Turmeric 500 MG CAPS Take by mouth.    . Vitamin D, Ergocalciferol, (DRISDOL) 1.25 MG (50000 UT) CAPS capsule Take 1 capsule (50,000 Units total) by mouth every 7 (seven) days. 4 capsule 0  . VITAMIN K PO Take 150 mcg by mouth daily.     No current facility-administered medications on file prior to visit.     PAST MEDICAL HISTORY: Past Medical History:  Diagnosis Date  . Anxiety   . GERD (gastroesophageal reflux disease)   . Infertility, female   . PCOS (polycystic ovarian syndrome)   . Pre-diabetes   . Vaginismus   . Vitamin D deficiency     PAST SURGICAL HISTORY: History reviewed. No pertinent surgical history.  SOCIAL HISTORY:  Social History   Tobacco Use  . Smoking status: Never Smoker  . Smokeless tobacco: Never Used  Substance Use Topics  . Alcohol use: No  . Drug use: No    FAMILY HISTORY: Family History  Problem Relation Age of Onset  . High blood pressure Mother   . Thyroid disease Mother   . Obesity Mother   . AAA (abdominal aortic aneurysm) Father     ROS: Review of Systems  Constitutional: Positive for malaise/fatigue and weight loss.  Gastrointestinal: Negative for nausea and vomiting.  Musculoskeletal:       Negative muscle weakness  Psychiatric/Behavioral: Positive for depression. Negative for suicidal ideas.       + Anxiety    PHYSICAL EXAM: Pt in no acute distress  RECENT LABS AND TESTS: BMET    Component Value Date/Time   NA 141 12/23/2017 1249   K 4.2 12/23/2017 1249   CL 103 12/23/2017 1249   CO2 23 12/23/2017 1249   GLUCOSE 72 12/23/2017 1249   GLUCOSE 110 (H) 08/04/2013 2248   BUN 9 12/23/2017 1249   CREATININE 0.81 12/23/2017 1249   CALCIUM 9.2 12/23/2017 1249   GFRNONAA 98 12/23/2017 1249    GFRAA 113 12/23/2017 1249   Lab Results  Component Value Date   HGBA1C 5.6 04/21/2018   HGBA1C 5.6 12/23/2017   Lab Results  Component Value Date   INSULIN 19.9 04/21/2018   INSULIN 13.4 12/23/2017   CBC    Component Value Date/Time   WBC 9.7 12/23/2017 1249   WBC 14.2 (H) 08/04/2013 2248   RBC 4.68 12/23/2017 1249   RBC 4.62 08/04/2013 2248   HGB 12.8 12/23/2017 1249   HCT 40.5 12/23/2017 1249   PLT 427 (H) 08/04/2013 2248   MCV 87 12/23/2017 1249   MCH 27.4 12/23/2017 1249   MCH 29.2 08/04/2013 2248   MCHC 31.6 12/23/2017 1249   MCHC 33.7 08/04/2013 2248   RDW 12.5 12/23/2017 1249   LYMPHSABS 2.6 12/23/2017 1249   MONOABS 0.8 08/04/2013 2239   EOSABS 0.1 12/23/2017 1249   BASOSABS 0.0 12/23/2017 1249   Iron/TIBC/Ferritin/ %Sat No results found for: IRON, TIBC, FERRITIN, IRONPCTSAT Lipid Panel     Component Value Date/Time   CHOL 174 04/21/2018 0856   TRIG 73 04/21/2018 0856   HDL 38 (L) 04/21/2018 0856   LDLCALC 121 (H) 04/21/2018 0856   Hepatic Function Panel     Component Value Date/Time   PROT 7.0 12/23/2017 1249   ALBUMIN 3.9 12/23/2017 1249   AST 14 12/23/2017 1249   ALT 12 12/23/2017 1249   ALKPHOS 93 12/23/2017 1249   BILITOT 0.3 12/23/2017 1249   BILIDIR <0.10 06/12/2016 1309      Component Value Date/Time   TSH 1.290 12/23/2017 1249      I, Burt Knack, am acting as transcriptionist for Debbra Riding, MD  I have reviewed the above documentation for accuracy and completeness, and I agree with the above. - Debbra Riding, MD

## 2018-08-31 ENCOUNTER — Ambulatory Visit (INDEPENDENT_AMBULATORY_CARE_PROVIDER_SITE_OTHER): Payer: 59 | Admitting: Psychology

## 2018-08-31 ENCOUNTER — Other Ambulatory Visit: Payer: Self-pay

## 2018-08-31 DIAGNOSIS — F3289 Other specified depressive episodes: Secondary | ICD-10-CM | POA: Diagnosis not present

## 2018-08-31 NOTE — Progress Notes (Signed)
Office: [phone]  /  Fax: [phone]    Date:Aug 31, 2018    Appointment Start Time: 12:03pm Duration: 27 minutes Provider: Glennie Isle, Psy.D. 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 female presenting via [address] for a follow-up appointment to address the previously established treatment goal of decreasing emotional eating. Of note, this provider called Cynthia Figueroa at 12:02pm as she did not present for the [facility] appointment. She answered and noted, "I'm glad you called and reminded me." As such, the appointment was initiated 3 minutes late. Today's appointment was a telepsychological visit, as this provider's clinic is closed for in-person visits due to COVID-19. Therapeutic services will resume to in-person appointments once the clinic re-opens. Cynthia Figueroa expressed understanding regarding the rationale for telepsychological services, and provided verbal consent for today's appointment. Prior to proceeding with today's appointment, Cynthia Figueroa's physical location at the time of this appointment was obtained. Justiss reported she was at work and provided the address. In the event of technical difficulties, Cynthia Figueroa shared a phone number she could be reached at. Casy and this provider participated in today's telepsychological service. Also, Cynthia Figueroa denied anyone else being present in the room or on the WebEx appointment.  This provider conducted a brief check-in and verbally administered the PHQ-9 and GAD-7. [name] indicated that during her appointment with Dr. Adair Patter yesterday she was switched to the Category 2 plan. She shared she also met with an infertility doctor last week, which resulted in a change in her metformin prescription, which she informed Dr. Adair Patter about during their appointment. Additionally, [name] explained she was  scheduled with the new mental health provider for an appointment yesterday, but she indicated it had to be rescheduled due to work. The appointment was rescheduled to September 15, 2018.  Particia reported an increase in hunger for the first time since starting with the clinic, but she is unsure where it is stemming from. Thus, this was explored further and the importance of protein intake was discussed. It was identified Cynthia Figueroa may go long periods (I.e., greater than 5 hours) without eating. As such, it was recommended she set alarms on her phone. [name] was receptive and was observed setting alarms for breakfast, lunch and a snack before dinner. Barriers that may prevent follow through with the alarms were explored. Tiondra indicated there were no barriers she could think of at this time. Regarding mindfulness, Cynthia Figueroa noted, "I've tried to, but I've been so stressed out." She added, "I'm just trying to get these masks done." Thus, the importance of mindfulness was reviewed and its benefit as it relates to stress was discussed. Given Cynthia Figueroa's busy schedule, this provider recommended she engage in informal mindfulness practice, specifically when working on masks using her senses. She was agreeable. Cynthia Figueroa provided verbal consent during today's appointment for this provider to re-send the telepsychological service consent document as well as the handout with the five senses mindfulness exercise via e-mail. Cynthia Figueroa was receptive to today's session as evidenced by openness to sharing, responsiveness to feedback, and willingness to implement discussed strategies.  Mental Status Examination:  Appearance: neat Behavior: cooperative Mood: euthymic Affect: mood congruent Speech: normal in rate, volume, and tone Eye Contact: appropriate Psychomotor Activity: appropriate Thought Process: linear, logical, and goal directed  Content/Perceptual Disturbances: denies suicidal and homicidal ideation, plan, and intent and  no hallucinations, delusions, bizarre thinking or behavior reported or  observed Orientation: time, person, place and purpose of appointment Cognition/Sensorium: memory, attention, language, and fund of knowledge intact  Insight: good Judgment: good  Structured Assessment Results: The Patient Health Questionnaire-9 (PHQ-9) is a self-report measure that assesses symptoms and severity of depression over the course of the last two weeks. Cynthia Figueroa obtained a score of 6 suggesting mild depression. Cynthia Figueroa finds the endorsed symptoms to be somewhat difficult. Decreased interest 0  Down, depressed, hopeless 1  Altered sleeping 0  Tired, decreased energy 3  Change in appetite 1  Feeling bad or failure about yourself 1  Trouble concentrating 0  Moving slowly or fidgety/restless 0  Suicidal thoughts 0  PHQ-9 Score 6    The Generalized Anxiety Disorder-7 (GAD-7) is a brief self-report measure that assesses symptoms of anxiety over the course of the last two weeks. Cynthia Figueroa obtained a score of 9 suggesting mild anxiety. Cynthia Figueroa finds the endorsed symptoms to be not difficult at all. Nervous, anxious, on edge 3  Control/stop worrying 0  Worrying too much- different things 3  Trouble relaxing 0  Restless 0  Easily annoyed or irritable 3  Afraid-awful might happen 0  GAD-7 Score 9   Interventions:  Conducted a brief chart review Verbal administration of PHQ-9 and GAD-7 for symptom monitoring Provided empathic reflections and validation Reviewed content from the previous session Engaged patient in goal setting Employed supportive psychotherapy interventions to facilitate reduced distress, and to improve coping skills with identified stressors  DSM-5 Diagnosis: 311 (F32.8) Other Specified Depressive Disorder, Emotional Eating Behaviors  Treatment Goal & Progress: During the initial appointment with this provider, the following treatment goal was established: decrease emotional eating. Cynthia Figueroa has  demonstrated some progress in her goal as evidenced by increased awareness of hunger patterns and triggers for emotional eating. Recent stressors appear to have contributed to her skipping meals; however, she demonstrates willingness to engage in learned skills to resume eating congruent to her meal plan and improve coping.   Plan: Shaterria continues to appear able and willing to participate as evidenced by engagement in reciprocal conversation, and asking questions for clarification as appropriate. Based on recent difficulties with eating, the next appointment will be scheduled in one week, which will be via [employer]. Once this provider's office resumes in-person appointments, Maddalyn will be notified. The next session will focus further on mindfulness.

## 2018-09-01 DIAGNOSIS — F430 Acute stress reaction: Secondary | ICD-10-CM | POA: Diagnosis not present

## 2018-09-06 ENCOUNTER — Encounter (INDEPENDENT_AMBULATORY_CARE_PROVIDER_SITE_OTHER): Payer: Self-pay

## 2018-09-06 ENCOUNTER — Ambulatory Visit (INDEPENDENT_AMBULATORY_CARE_PROVIDER_SITE_OTHER): Payer: 59 | Admitting: Psychology

## 2018-09-06 ENCOUNTER — Telehealth (INDEPENDENT_AMBULATORY_CARE_PROVIDER_SITE_OTHER): Payer: Self-pay | Admitting: Psychology

## 2018-09-06 MED ORDER — VITAMIN D (ERGOCALCIFEROL) 1.25 MG (50000 UNIT) PO CAPS: 50000.0000 [IU] | ORAL_CAPSULE | ORAL | 0 refills | Status: DC

## 2018-09-06 MED ORDER — SERTRALINE HCL 50 MG PO TABS: 50.0000 mg | ORAL_TABLET | Freq: Every day | ORAL | 0 refills | Status: DC

## 2018-09-06 NOTE — Telephone Encounter (Signed)
  Office: [phone]  /  Fax: [phone]  Date of Call: Sep 06, 2018  Time of Call: 12:32pm Provider: Lawerance Cruel, PsyD  CONTENT:  This provider called Cynthia Figueroa to check-in as she did not present for her 12:30pm Webex appointment. A HIPAA compliant voicemail was left requesting a call back. Notably, this provider stayed on the [facility] appointment for 10 minutes prior to ending the appointment.   PLAN: This provider will wait for [name] to call back. If needed, this provider or the provider's clinic will follow-up with [facility] in approximately one week.

## 2018-09-06 NOTE — Progress Notes (Unsigned)
  Office: [phone]  /  Fax: [phone]    Date: Sep 06, 2018    Appointment Start Time:*** Duration:*** Provider: Lawerance Cruel, Psy.D. Type of Session: Individual Therapy  Location of Patient: *** Location of Provider: Provider's Home Type of Contact: Telepsychological Visit via [employer] Web[employer]   Session Content: Cynthia Figueroa is a 33 y.o. female presenting via [employer] Web[employer] for a follow-up appointment to address the previously established treatment goal of decreasing emotional eating. Today's appointment was a telepsychological visit, as this provider's clinic is closed for in-person visits due to COVID-19. Therapeutic services will resume to in-person appointments once the clinic re-opens. Cynthia Figueroa expressed understanding regarding the rationale for telepsychological services, and provided verbal consent for today's appointment. Prior to proceeding with today's appointment, Cynthia Figueroa's physical location at the time of this appointment was obtained. Cynthia Figueroa reported she was at *** and provided the address. In the event of technical difficulties, Cynthia Figueroa shared a phone number she could be reached at. Cynthia Figueroa and this provider participated in today's telepsychological service. Also, Cynthia Figueroa denied anyone else being present in the room or on the WebEx appointment ***.  This provider conducted a brief check-in and verbally administered the PHQ-9 and GAD-7. ***   Cynthia Figueroa was receptive to today's session as evidenced by openness to sharing, responsiveness to feedback, and ***.  Mental Status Examination:  Appearance: {Appearance:22431} Behavior: {Behavior:22445} Mood: {Teletherapy mood:22435} Affect: {Affect:22436} Speech: {Speech:22432} Eye Contact: {Eye Contact:22433} Psychomotor Activity: {Motor Activity:22434} Thought Process: {thought process:22448}  Content/Perceptual Disturbances: {disturbances:22451} Orientation: {Orientation:22437} Cognition/Sensorium: {gbcognition:22449} Insight:  {Insight:22446} Judgment: {Insight:22446}  Structured Assessment Results: The Patient Health Questionnaire-9 (PHQ-9) is a self-report measure that assesses symptoms and severity of depression over the course of the last two weeks. Summar obtained a score of *** suggesting {GBPHQ9SEVERITY:21752}. Cynthia Figueroa finds the endorsed symptoms to be {gbphq9difficulty:21754}. Decreased interest ***  Down, depressed, hopeless ***  Altered sleeping ***  Tired, decreased energy ***  Change in appetite ***  Feeling bad or failure about yourself ***  Trouble concentrating ***  Moving slowly or fidgety/restless ***  Suicidal thoughts ***  PHQ-9 Score ***    The Generalized Anxiety Disorder-7 (GAD-7) is a brief self-report measure that assesses symptoms of anxiety over the course of the last two weeks. Cynthia Figueroa obtained a score of *** suggesting {gbgad7severity:21753}. Cynthia Figueroa finds the endorsed symptoms to be {gbphq9difficulty:21754}. Nervous, anxious, on edge ***  Control/stop worrying ***  Worrying too much- different things ***  Trouble relaxing ***  Restless ***  Easily annoyed or irritable ***  Afraid-awful might happen ***  GAD-7 Score ***   Interventions:  {Interventions:22172}  DSM-5 Diagnosis: 311 (F32.8) Other Specified Depressive Disorder, Emotional Eating Behaviors  Treatment Goal & Progress: During the initial appointment with this provider, the following treatment goal was established: decrease emotional eating. Cynthia Figueroa has demonstrated progress in her goal as evidenced by ***  Plan: Cynthia Figueroa continues to appear able and willing to participate as evidenced by engagement in reciprocal conversation, and asking questions for clarification as appropriate. The next appointment will be scheduled in {gbweeks:21758}, which will be via [employer]. Once this provider's office resumes in-person appointments, Cynthia Figueroa will be notified. The next session will focus on reviewing learned skills, and working  towards the established treatment goal.***

## 2018-09-07 MED FILL — VIT D2 1.25 MG (50,000 UNIT: 1.25 MG | 28 days supply | Qty: 4 | Fill #0

## 2018-09-07 MED FILL — SERTRALINE HCL 50 MG TABS: 50 | 30 days supply | Qty: 30 | Fill #0

## 2018-09-13 ENCOUNTER — Other Ambulatory Visit: Payer: Self-pay

## 2018-09-13 ENCOUNTER — Encounter (INDEPENDENT_AMBULATORY_CARE_PROVIDER_SITE_OTHER): Payer: Self-pay | Admitting: Family Medicine

## 2018-09-13 ENCOUNTER — Ambulatory Visit (INDEPENDENT_AMBULATORY_CARE_PROVIDER_SITE_OTHER): Payer: 59 | Admitting: Family Medicine

## 2018-09-13 DIAGNOSIS — E669 Obesity, unspecified: Secondary | ICD-10-CM

## 2018-09-13 DIAGNOSIS — E559 Vitamin D deficiency, unspecified: Secondary | ICD-10-CM

## 2018-09-13 DIAGNOSIS — Z6833 Body mass index (BMI) 33.0-33.9, adult: Secondary | ICD-10-CM

## 2018-09-13 DIAGNOSIS — E7849 Other hyperlipidemia: Secondary | ICD-10-CM | POA: Diagnosis not present

## 2018-09-13 NOTE — Progress Notes (Signed)
Office: [phone]  /  Fax: [phone] TeleHealth Visit:  Cynthia Figueroa has verbally consented to this TeleHealth visit today. The patient is located at home, the provider is located at the [facility] office. The participants in this visit include the listed provider and patient. The visit was conducted today via face time.  HPI:   Chief Complaint: OBESITY Cynthia Figueroa is here to discuss her progress with her obesity treatment plan. She is on the Category 2 plan + 100 calories if needed and is following her eating plan approximately 50 % of the time. She states she is exercising 0 minutes 0 times per week. Cynthia Figueroa had some GI upset with increase in metformin, so she struggled with eating all of her food because of nausea. She is finding the 45 calorie bread is still difficult. 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 Her weight is of 185 lbs today. We were unable to weigh the patient today for this TeleHealth visit. She feels as if she has maintained her weight since her last visit. She has lost 4-10 lbs since starting treatment with [place].  Hyperlipidemia Cynthia Figueroa has hyperlipidemia and has been trying to improve her cholesterol levels with intensive lifestyle modification including a low saturated fat diet, exercise and weight loss. Last FLP in January 2019 was of 121. She is not on statin and denies any chest pain, claudication or myalgias.  Vitamin D Deficiency Cynthia Figueroa has a diagnosis of vitamin D deficiency. She is currently taking prescription Vit D. She notes fatigue and denies nausea, vomiting or muscle weakness.  ASSESSMENT AND PLAN:  Other hyperlipidemia  Vitamin D deficiency  Class 1 obesity with serious comorbidity and body mass index (BMI) of 33.0 to 33.9 in adult, unspecified obesity type  PLAN:  Hyperlipidemia Cynthia Figueroa was informed of the American Heart Association Guidelines emphasizing intensive lifestyle  modifications as the first line treatment for hyperlipidemia. We discussed many lifestyle modifications today in depth, and Cynthia Figueroa will continue to work on decreasing saturated fats such as fatty red meat, butter and many fried foods. She will also increase vegetables and lean protein in her diet and continue to work on exercise and weight loss efforts. We will repeat labs at her first in person appointment. Cynthia Figueroa agrees to follow up with our clinic in 2 weeks.  Vitamin D Deficiency Cynthia Figueroa was informed that low vitamin D levels contributes to fatigue and are associated with obesity, breast, and colon cancer. Cynthia Figueroa agrees to continue taking prescription Vit D @50 ,000 IU every week and will follow up for routine testing of vitamin D, at least 2-3 times per year. She was informed of the risk of over-replacement of vitamin D and agrees to not increase her dose unless she discusses this with [place] first. We will repeat labs at her first in person appointment. Cynthia Figueroa agrees to follow up with our clinic in 2 weeks.  Obesity Cynthia Figueroa is currently in the action stage of change. As such, her goal is to continue with weight loss efforts She has agreed to follow the Category 2 plan Cynthia Figueroa has been instructed to work up to a goal of 150 minutes of combined cardio and strengthening exercise per week for weight loss and overall health benefits. We discussed the following Behavioral Modification Strategies today: increasing lean protein intake, increasing vegetables and work on meal planning and easy cooking plans, keeping healthy foods in the home, better snacking choices, and planning for success   Cynthia Figueroa  has agreed to follow up with our clinic in 2 weeks. She was informed of the importance of frequent follow up visits to maximize her success with intensive lifestyle modifications for her multiple health conditions.  ALLERGIES: Allergies  Allergen Reactions  . Lamisil [Terbinafine] Hives    MEDICATIONS:  Current Outpatient Medications on File Prior to Visit  Medication Sig Dispense Refill  . ASHWAGANDHA PO Take 300 mg by mouth daily.    . Cholecalciferol (VITAMIN D3) 2000 units CHEW Chew by mouth.    . Cranberry 500 MG CAPS Take by mouth.    . dimenhyDRINATE (DRAMAMINE) 50 MG tablet Take 1 tablet (50 mg total) by mouth every 8 (eight) hours as needed. 30 tablet 0  . fluconazole (DIFLUCAN) 150 MG tablet Take 1 tablet (150 mg total) by mouth once a week. 4 tablet 0  . IRON PO Take 1 tablet by mouth daily.    Marland Kitchen. itraconazole (SPORANOX) 100 MG capsule Take 1 capsule (100 mg total) by mouth daily. 90 capsule 0  . liraglutide (VICTOZA) 18 MG/3ML SOPN Inject 0.1 mLs (0.6 mg total) into the skin every morning. 1 pen 0  . Liraglutide -Weight Management (SAXENDA) 18 MG/3ML SOPN Inject 0.6 mg into the skin daily. 1 pen 0  . MAGNESIUM PO Take 1 tablet by mouth daily.    . meclizine (ANTIVERT) 25 MG tablet Take 1 tablet (25 mg total) by mouth 3 (three) times daily as needed for dizziness. 30 tablet 0  . metFORMIN (GLUCOPHAGE) 500 MG tablet Take 1 tablet (500 mg total) by mouth daily with breakfast. 30 tablet 0  . Multiple Vitamins-Minerals (MULTIVITAMIN PO) Take by mouth.    . NON FORMULARY Lemon balm herb  330 mg /mL qd prn    . Probiotic Product (PROBIOTIC DAILY PO) Take by mouth.    . progesterone (PROMETRIUM) 100 MG capsule Take 100 mg by mouth daily.    . sertraline (ZOLOFT) 50 MG tablet Take 1 tablet (50 mg total) by mouth daily. 30 tablet 0  . Turmeric 500 MG CAPS Take by mouth.    . Vitamin D, Ergocalciferol, (DRISDOL) 1.25 MG (50000 UT) CAPS capsule Take 1 capsule (50,000 Units total) by mouth every 7 (seven) days. 4 capsule 0  . VITAMIN K PO Take 150 mcg by mouth daily.     No current facility-administered medications on file prior to visit.     PAST MEDICAL HISTORY: Past Medical History:  Diagnosis Date  . Anxiety   . GERD (gastroesophageal reflux disease)   . Infertility, female   .  PCOS (polycystic ovarian syndrome)   . Pre-diabetes   . Vaginismus   . Vitamin D deficiency     PAST SURGICAL HISTORY: History reviewed. No pertinent surgical history.  SOCIAL HISTORY: Social History   Tobacco Use  . Smoking status: Never Smoker  . Smokeless tobacco: Never Used  Substance Use Topics  . Alcohol use: No  . Drug use: No    FAMILY HISTORY: Family History  Problem Relation Age of Onset  . High blood pressure Mother   . Thyroid disease Mother   . Obesity Mother   . AAA (abdominal aortic aneurysm) Father     ROS: Review of Systems  Constitutional: Positive for malaise/fatigue. Negative for weight loss.  Cardiovascular: Negative for chest pain and claudication.  Gastrointestinal: Negative for nausea and vomiting.  Musculoskeletal: Negative for myalgias.       Negative muscle weakness    PHYSICAL EXAM: Pt in no acute  distress  RECENT LABS AND TESTS: BMET    Component Value Date/Time   NA 141 12/23/2017 1249   K 4.2 12/23/2017 1249   CL 103 12/23/2017 1249   CO2 23 12/23/2017 1249   GLUCOSE 72 12/23/2017 1249   GLUCOSE 110 (H) 08/04/2013 2248   BUN 9 12/23/2017 1249   CREATININE 0.81 12/23/2017 1249   CALCIUM 9.2 12/23/2017 1249   GFRNONAA 98 12/23/2017 1249   GFRAA 113 12/23/2017 1249   Lab Results  Component Value Date   HGBA1C 5.6 04/21/2018   HGBA1C 5.6 12/23/2017   Lab Results  Component Value Date   INSULIN 19.9 04/21/2018   INSULIN 13.4 12/23/2017   CBC    Component Value Date/Time   WBC 9.7 12/23/2017 1249   WBC 14.2 (H) 08/04/2013 2248   RBC 4.68 12/23/2017 1249   RBC 4.62 08/04/2013 2248   HGB 12.8 12/23/2017 1249   HCT 40.5 12/23/2017 1249   PLT 427 (H) 08/04/2013 2248   MCV 87 12/23/2017 1249   MCH 27.4 12/23/2017 1249   MCH 29.2 08/04/2013 2248   MCHC 31.6 12/23/2017 1249   MCHC 33.7 08/04/2013 2248   RDW 12.5 12/23/2017 1249   LYMPHSABS 2.6 12/23/2017 1249   MONOABS 0.8 08/04/2013 2239   EOSABS 0.1 12/23/2017  1249   BASOSABS 0.0 12/23/2017 1249   Iron/TIBC/Ferritin/ %Sat No results found for: IRON, TIBC, FERRITIN, IRONPCTSAT Lipid Panel     Component Value Date/Time   CHOL 174 04/21/2018 0856   TRIG 73 04/21/2018 0856   HDL 38 (L) 04/21/2018 0856   LDLCALC 121 (H) 04/21/2018 0856   Hepatic Function Panel     Component Value Date/Time   PROT 7.0 12/23/2017 1249   ALBUMIN 3.9 12/23/2017 1249   AST 14 12/23/2017 1249   ALT 12 12/23/2017 1249   ALKPHOS 93 12/23/2017 1249   BILITOT 0.3 12/23/2017 1249   BILIDIR <0.10 06/12/2016 1309      Component Value Date/Time   TSH 1.290 12/23/2017 1249      I, Burt Knack, am acting as transcriptionist for Debbra Riding, MD  I have reviewed the above documentation for accuracy and completeness, and I agree with the above. - Debbra Riding, MD

## 2018-09-15 ENCOUNTER — Ambulatory Visit (INDEPENDENT_AMBULATORY_CARE_PROVIDER_SITE_OTHER): Payer: 59 | Admitting: Psychology

## 2018-09-15 ENCOUNTER — Telehealth (INDEPENDENT_AMBULATORY_CARE_PROVIDER_SITE_OTHER): Payer: Self-pay | Admitting: Psychology

## 2018-09-15 DIAGNOSIS — F418 Other specified anxiety disorders: Secondary | ICD-10-CM

## 2018-09-15 NOTE — Telephone Encounter (Signed)
  Office: [phone]  /  Fax: [phone]  Date of Call: September 15, 2018  Time of Call: 4:43pm Duration of Call: 4 minutes Provider: Glennie Isle, PsyD  CONTENT: This provider called Franny to check-in and schedule a follow-up appointment, as Ronny Bacon did not present for her [facility] appointment on Sep 06, 2018. Arieliz explained she got busy with work and did not intentionally miss the appointment. She further shared she met with her new provider based on the referral placed by this provider. Their next appointment is October 01, 2018 at 1:30pm. Clarivel was receptive to scheduling an additional appointment with this provider for potential termination. A brief risk assessment was completed. Shagun denied experiencing suicidal and homicidal ideation, plan, and intent since the last appointment with this provider.  PLAN:  Ubah is scheduled for an appointment on September 19, 2018 at 8:00am via Webex.

## 2018-09-16 DIAGNOSIS — Z3141 Encounter for fertility testing: Secondary | ICD-10-CM | POA: Diagnosis not present

## 2018-09-16 DIAGNOSIS — Z319 Encounter for procreative management, unspecified: Secondary | ICD-10-CM | POA: Diagnosis not present

## 2018-09-16 DIAGNOSIS — E282 Polycystic ovarian syndrome: Secondary | ICD-10-CM | POA: Diagnosis not present

## 2018-09-16 DIAGNOSIS — N970 Female infertility associated with anovulation: Secondary | ICD-10-CM | POA: Diagnosis not present

## 2018-09-16 MED FILL — DIAZEPAM 10 MG TABS: 10 | 10 days supply | Qty: 10 | Fill #0

## 2018-09-16 MED FILL — OXYCODON-ACETAMINOPHEN 7.5-: 7.5-325 | 10 days supply | Qty: 10 | Fill #0

## 2018-09-19 ENCOUNTER — Encounter (INDEPENDENT_AMBULATORY_CARE_PROVIDER_SITE_OTHER): Payer: Self-pay

## 2018-09-19 ENCOUNTER — Other Ambulatory Visit: Payer: Self-pay

## 2018-09-19 ENCOUNTER — Ambulatory Visit (INDEPENDENT_AMBULATORY_CARE_PROVIDER_SITE_OTHER): Payer: 59 | Admitting: Psychology

## 2018-09-19 DIAGNOSIS — F3289 Other specified depressive episodes: Secondary | ICD-10-CM | POA: Diagnosis not present

## 2018-09-19 NOTE — Progress Notes (Signed)
Office: [phone]  /  Fax: [phone]    Date: September 19, 2018   Appointment Start Time: 8:08am Duration: 28 minutes Provider: Glennie Isle, Psy.D. Type of Session: Individual Therapy  Location of Patient: Home Location of Provider: Provider's Home Type of Contact: Telepsychological Visit via [employer] WebEx   Session Content: Cynthia Figueroa is a 33 y.o. female presenting via [place] for a follow-up appointment to address the previously established treatment goal of decreasing emotional eating. Of note, this provider called Cynthia Figueroa at 8:02am, as she did not present for today's Webex appointment. It appeared she forgot about today's appointment as evidenced by her stating "oh" when this provider mentioned today's appointment. Nevertheless, Cynthia Figueroa was receptive to meeting via Webex as planned. This provider called Cynthia Figueroa again at 8:07am as she did not present. She explained she was unable to connect with the link provided; therefore, the secure link for today's appointment was re-sent. As such, today's appointment was initiated 8 minutes late. Today's appointment was a telepsychological visit, as this provider's clinic is seeing a limited number of patients for in-person visits due to COVID-19. Therapeutic services will resume to in-person appointments once deemed appropriate. Cynthia Figueroa expressed understanding regarding the rationale for telepsychological services, and provided verbal consent for today's appointment. Prior to proceeding with today's appointment, Cynthia Figueroa's physical location at the time of this appointment was obtained. Cynthia Figueroa reported she was at home in her bathroom and provided the address. In the event of technical difficulties, Cynthia Figueroa shared a phone number she could be reached at. Cynthia Figueroa and this provider participated in today's telepsychological service. Also, Cynthia Figueroa denied anyone else being present in the bathroom or on the WebEx appointment. Of note, the Webex connection was  disrupted at 8:21am; therefore, this provider called Cynthia Figueroa and she did not answer. The connection was quickly re-established and she explained her boss called.   This provider requested again that [facility] sent the completed consent form for telepsychological services; she agreed. This provider conducted a brief check-in and verbally administered the PHQ-9 and GAD-7. Cynthia Figueroa shared she had a "HSG" procedure on Friday and discussed spending time with her step-children over the weekend. Additionally, Cynthia Figueroa indicated she met with her new therapist since the last appointment with this provider. She explained she met with him last Thursday and their next appointment in on October 01, 2018. Regarding emotional eating, Cynthia Figueroa shared she has noticed that when she has not eaten for long periods of time and is experiencing physical hunger, she is also experiencing cravings for sweets. This was explored and processed. Cynthia Figueroa explained the aforementioned occurs "a couple days out of the week" resulting in her consuming a big chocolate chip cookie along with her meal congruent to the structured meal plan. Cynthia Figueroa reported she is setting reminders to eat and is also journaling, which she described as helpful. Nevertheless, she discussed difficulty stopping to eat when she is busy. As such, this provider recommended she plans snacks ahead of time; Cynthia Figueroa agreed. To assist with the emotional eating, psychoeducation regarding the hunger and satisfaction scale was provided. This provider sent Cynthia Figueroa a MyChart message with a handout for the hunger and satisfaction scale. Prior to sending the message, this provider explained the message would be visible to all providers, as it would be part of the electronic medical record. Cynthia Figueroa verbally acknowledged understanding, and verbally consented to this provider sending the MyChart message. Session also focused on termination planning. At this time, Cynthia Figueroa requested to not terminate  services with this provider as she continues  to be established with her new therapist. She noted a plan to check with her insurance company regarding coverage and was receptive to this provider's clinic checking-in with her regarding scheduling a follow-up appointment in approximately one week. Furthermore, this provider and Cynthia Figueroa discussed points from treatment that may be helpful to relay to her new therapist for continuing treatment (e.g., food is sometimes used to cope with stressful situations). Overall, Cynthia Figueroa was receptive to today's session as evidenced by openness to sharing, responsiveness to feedback, and willingness to engage in discussed strategies.  Mental Status Examination:  Appearance: neat Behavior: cooperative Mood: euthymic Affect: mood congruent Speech: normal in rate, volume, and tone Eye Contact: appropriate Psychomotor Activity: appropriate Thought Process: linear, logical, and goal directed  Content/Perceptual Disturbances: denies suicidal and homicidal ideation, plan, and intent and no hallucinations, delusions, bizarre thinking or behavior reported or observed Orientation: time, person, place and purpose of appointment Cognition/Sensorium: memory, attention, language, and fund of knowledge intact  Insight: good Judgment: good  Structured Assessment Results: The Patient Health Questionnaire-9 (PHQ-9) is a self-report measure that assesses symptoms and severity of depression over the course of the last two weeks. Cynthia Figueroa obtained a score of 4 suggesting minimal depression. Cynthia Figueroa finds the endorsed symptoms to be not difficult at all. Decreased interest 0  Down, depressed, hopeless 0  Altered sleeping 0  Tired, decreased energy 3  Change in appetite 1  Feeling bad or failure about yourself 0  Trouble concentrating 0  Moving slowly or fidgety/restless 0  Suicidal thoughts 0  PHQ-9 Score 4    The Generalized Anxiety Disorder-7 (GAD-7) is a brief self-report  measure that assesses symptoms of anxiety over the course of the last two weeks. Cynthia Figueroa obtained a score of 3 suggesting minimal anxiety. Cynthia Figueroa finds the endorsed symptoms to be not difficult at all. Nervous, anxious, on edge 0  Control/stop worrying 0  Worrying too much- different things 3  Trouble relaxing 0  Restless 0  Easily annoyed or irritable 0  Afraid-awful might happen 0  GAD-7 Score 3   Interventions:  Conducted a brief chart review Verbal administration of PHQ-9 and GAD-7 for symptom monitoring Provided empathic reflections and validation Engaged patient in problem solving Discussed termination planning Psychoeducation provided regarding the hunger and satisfaction scale Employed supportive psychotherapy interventions to facilitate reduced distress, and to improve coping skills with identified stressors  Provided positive reinforcement   DSM-5 Diagnosis: 311 (F32.8) Other Specified Depressive Disorder, Emotional Eating Behaviors  Treatment Goal & Progress: During the initial appointment with this provider, the following treatment goal was established: decrease emotional eating. Cynthia Figueroa has demonstrated progress in her goal as evidenced by increased awareness of hunger patterns and triggers for emotional eating. Cynthia Figueroa acknowledged episodes of emotional eating, but described an overall reduction. She also continues to demonstrate willingness to engage in learned skills.   Plan: Cynthia Figueroa indicated she will be checking with her insurance company regarding mental health coverage prior to scheduling a follow-up appointment with this provider. Additionally, she indicated she is scheduled for a follow-up appointment with her new therapist on October 01, 2018. This provider's clinic will call Cynthia Figueroa to check-in in approximately one week.

## 2018-09-22 MED FILL — LETROZOLE 2.5 MG TABLET: 2.5 | 30 days supply | Qty: 15 | Fill #1

## 2018-09-26 ENCOUNTER — Ambulatory Visit (INDEPENDENT_AMBULATORY_CARE_PROVIDER_SITE_OTHER): Payer: 59 | Admitting: Family Medicine

## 2018-09-26 ENCOUNTER — Encounter (INDEPENDENT_AMBULATORY_CARE_PROVIDER_SITE_OTHER): Payer: Self-pay | Admitting: Family Medicine

## 2018-09-26 ENCOUNTER — Other Ambulatory Visit: Payer: Self-pay

## 2018-09-26 DIAGNOSIS — E8881 Metabolic syndrome: Secondary | ICD-10-CM

## 2018-09-26 DIAGNOSIS — F418 Other specified anxiety disorders: Secondary | ICD-10-CM | POA: Diagnosis not present

## 2018-09-26 DIAGNOSIS — E669 Obesity, unspecified: Secondary | ICD-10-CM

## 2018-09-26 DIAGNOSIS — Z6833 Body mass index (BMI) 33.0-33.9, adult: Secondary | ICD-10-CM

## 2018-09-27 MED ORDER — SERTRALINE HCL 50 MG PO TABS: 50.0000 mg | ORAL_TABLET | Freq: Every day | ORAL | 0 refills | Status: DC

## 2018-09-27 NOTE — Progress Notes (Signed)
Office: [phone]  /  Fax: [phone] TeleHealth Visit:  Cynthia Figueroa has verbally consented to this TeleHealth visit today. The patient is located at home, the provider is located at the [facility] office. The participants in this visit include the listed provider and patient. The visit was conducted today via face time.  HPI:   Chief Complaint: OBESITY Cynthia Figueroa is here to discuss her progress with her obesity treatment plan. She is on the Category 2 plan and is following her eating plan approximately 50 % of the time. She states she is exercising 0 minutes 0 times per week. Cynthia Figueroa had an ob-gyn appointment and procedure to see possible causes of infertility. She had a recent increase in metformin by her ob-gyn. She is having frequent diarrhea on metformin. She voices her main focus currently is getting pregnant.  We were unable to weigh the patient today for this TeleHealth visit. She feels as if she has lost 1 lb since her last visit. She has lost 10 lbs since starting treatment with us.  Insulin Resistance Cynthia Figueroa has a diagnosis of insulin resistance based on her elevated fasting insulin level >5. Although Towanda's blood glucose readings are still under good control, insulin resistance puts her at greater risk of metabolic syndrome and diabetes. She is now on metformin 1,000 mg BID and had to take a few days off secondary to diarrhea. She continues to work on diet and exercise to decrease risk of diabetes.  Depression with Anxiety Cynthia Figueroa struggles with depression and anxiety. She no side effects noted. She shows no sign of suicidal or homicidal ideations.  ASSESSMENT AND PLAN:  Insulin resistance  Depression with anxiety - Plan: sertraline (ZOLOFT) 50 MG tablet  Class 1 obesity with serious comorbidity and body mass index (BMI) of 33.0 to 33.9 in adult, unspecified obesity type  PLAN:  Insulin Resistance Cynthia Figueroa will continue to work on weight loss,  exercise, and decreasing simple carbohydrates in her diet to help decrease the risk of diabetes. We dicussed metformin including benefits and risks. She was informed that eating too many simple carbohydrates or too many calories at one sitting increases the likelihood of GI side effects. Cynthia Figueroa agrees to continue taking metformin, and she agrees to follow up with our clinic in 2 weeks as directed to monitor her progress.  Depression with Anxiety We discussed behavior modification techniques today to help Cynthia Figueroa deal with her depression and anxiety. Cynthia Figueroa agrees to continue taking Zoloft 50 mg PO daily #30 and we will refill for 1 month. Cynthia Figueroa agrees to follow up with our clinic in 2 weeks.  Obesity Cynthia Figueroa is currently in the action stage of change. As such, her goal is to continue with weight loss efforts She has agreed to follow the Category 2 plan Cynthia Figueroa has been instructed to work up to a goal of 150 minutes of combined cardio and strengthening exercise per week for weight loss and overall health benefits. We discussed the following Behavioral Modification Strategies today: increasing lean protein intake, increasing vegetables and work on meal planning and easy cooking plans, keeping healthy foods in the home, better snacking choices, and planning for success   Cynthia Figueroa has agreed to follow up with our clinic in 2 weeks. She was informed of the importance of frequent follow up visits to maximize her success with intensive lifestyle modifications for her multiple health conditions.  ALLERGIES: Allergies  Allergen Reactions  . Lamisil [Terbinafine] Hives    MEDICATIONS: Current Outpatient Medications on File  Prior to Visit  Medication Sig Dispense Refill  . ASHWAGANDHA PO Take 300 mg by mouth daily.    . Cholecalciferol (VITAMIN D3) 2000 units CHEW Chew by mouth.    . Cranberry 500 MG CAPS Take by mouth.    . dimenhyDRINATE (DRAMAMINE) 50 MG tablet Take 1 tablet (50 mg total) by  mouth every 8 (eight) hours as needed. 30 tablet 0  . fluconazole (DIFLUCAN) 150 MG tablet Take 1 tablet (150 mg total) by mouth once a week. 4 tablet 0  . IRON PO Take 1 tablet by mouth daily.    Marland Kitchen. itraconazole (SPORANOX) 100 MG capsule Take 1 capsule (100 mg total) by mouth daily. 90 capsule 0  . liraglutide (VICTOZA) 18 MG/3ML SOPN Inject 0.1 mLs (0.6 mg total) into the skin every morning. 1 pen 0  . Liraglutide -Weight Management (SAXENDA) 18 MG/3ML SOPN Inject 0.6 mg into the skin daily. 1 pen 0  . MAGNESIUM PO Take 1 tablet by mouth daily.    . meclizine (ANTIVERT) 25 MG tablet Take 1 tablet (25 mg total) by mouth 3 (three) times daily as needed for dizziness. 30 tablet 0  . metFORMIN (GLUCOPHAGE) 500 MG tablet Take 1 tablet (500 mg total) by mouth daily with breakfast. 30 tablet 0  . Multiple Vitamins-Minerals (MULTIVITAMIN PO) Take by mouth.    . NON FORMULARY Lemon balm herb  330 mg /mL qd prn    . Probiotic Product (PROBIOTIC DAILY PO) Take by mouth.    . progesterone (PROMETRIUM) 100 MG capsule Take 100 mg by mouth daily.    . sertraline (ZOLOFT) 50 MG tablet Take 1 tablet (50 mg total) by mouth daily. 30 tablet 0  . Turmeric 500 MG CAPS Take by mouth.    . Vitamin D, Ergocalciferol, (DRISDOL) 1.25 MG (50000 UT) CAPS capsule Take 1 capsule (50,000 Units total) by mouth every 7 (seven) days. 4 capsule 0  . VITAMIN K PO Take 150 mcg by mouth daily.     No current facility-administered medications on file prior to visit.     PAST MEDICAL HISTORY: Past Medical History:  Diagnosis Date  . Anxiety   . GERD (gastroesophageal reflux disease)   . Infertility, female   . PCOS (polycystic ovarian syndrome)   . Pre-diabetes   . Vaginismus   . Vitamin D deficiency     PAST SURGICAL HISTORY: History reviewed. No pertinent surgical history.  SOCIAL HISTORY: Social History   Tobacco Use  . Smoking status: Never Smoker  . Smokeless tobacco: Never Used  Substance Use Topics  .  Alcohol use: No  . Drug use: No    FAMILY HISTORY: Family History  Problem Relation Age of Onset  . High blood pressure Mother   . Thyroid disease Mother   . Obesity Mother   . AAA (abdominal aortic aneurysm) Father     ROS: Review of Systems  Constitutional: Positive for weight loss.  Gastrointestinal: Positive for diarrhea.  Psychiatric/Behavioral: Positive for depression. Negative for suicidal ideas.    PHYSICAL EXAM: Pt in no acute distress  RECENT LABS AND TESTS: BMET    Component Value Date/Time   NA 141 12/23/2017 1249   K 4.2 12/23/2017 1249   CL 103 12/23/2017 1249   CO2 23 12/23/2017 1249   GLUCOSE 72 12/23/2017 1249   GLUCOSE 110 (H) 08/04/2013 2248   BUN 9 12/23/2017 1249   CREATININE 0.81 12/23/2017 1249   CALCIUM 9.2 12/23/2017 1249   GFRNONAA 98 12/23/2017  [place] 12/23/2017 1249   Lab Results  Component Value Date   HGBA1C 5.6 04/21/2018   HGBA1C 5.6 12/23/2017   Lab Results  Component Value Date   INSULIN 19.9 04/21/2018   INSULIN 13.4 12/23/2017   CBC    Component Value Date/Time   WBC 9.7 12/23/2017 1249   WBC 14.2 (H) 08/04/2013 2248   RBC 4.68 12/23/2017 1249   RBC 4.62 08/04/2013 2248   HGB 12.8 12/23/2017 1249   HCT 40.5 12/23/2017 1249   PLT 427 (H) 08/04/2013 2248   MCV 87 12/23/2017 1249   MCH 27.4 12/23/2017 1249   MCH 29.2 08/04/2013 2248   MCHC 31.6 12/23/2017 1249   MCHC 33.7 08/04/2013 2248   RDW 12.5 12/23/2017 1249   LYMPHSABS 2.6 12/23/2017 1249   MONOABS 0.8 08/04/2013 2239   EOSABS 0.1 12/23/2017 1249   BASOSABS 0.0 12/23/2017 1249   Iron/TIBC/Ferritin/ %Sat No results found for: IRON, TIBC, FERRITIN, IRONPCTSAT Lipid Panel     Component Value Date/Time   CHOL 174 04/21/2018 0856   TRIG 73 04/21/2018 0856   HDL 38 (L) 04/21/2018 0856   LDLCALC 121 (H) 04/21/2018 0856   Hepatic Function Panel     Component Value Date/Time   PROT 7.0 12/23/2017 1249   ALBUMIN 3.9 12/23/2017 1249   AST 14  12/23/2017 1249   ALT 12 12/23/2017 1249   ALKPHOS 93 12/23/2017 1249   BILITOT 0.3 12/23/2017 1249   BILIDIR <0.10 06/12/2016 1309      Component Value Date/Time   TSH 1.290 12/23/2017 1249      I, Trixie Dredge, am acting as transcriptionist for Ilene Qua, MD  I have reviewed the above documentation for accuracy and completeness, and I agree with the above. - Ilene Qua, MD

## 2018-09-29 ENCOUNTER — Encounter: Payer: 59 | Admitting: Internal Medicine

## 2018-09-30 DIAGNOSIS — Z3189 Encounter for other procreative management: Secondary | ICD-10-CM | POA: Diagnosis not present

## 2018-10-01 ENCOUNTER — Ambulatory Visit: Payer: 59 | Admitting: Psychology

## 2018-10-04 DIAGNOSIS — F430 Acute stress reaction: Secondary | ICD-10-CM | POA: Diagnosis not present

## 2018-10-05 DIAGNOSIS — Z3189 Encounter for other procreative management: Secondary | ICD-10-CM | POA: Diagnosis not present

## 2018-10-10 ENCOUNTER — Encounter (INDEPENDENT_AMBULATORY_CARE_PROVIDER_SITE_OTHER): Payer: Self-pay | Admitting: Family Medicine

## 2018-10-10 ENCOUNTER — Other Ambulatory Visit: Payer: Self-pay

## 2018-10-10 ENCOUNTER — Telehealth (INDEPENDENT_AMBULATORY_CARE_PROVIDER_SITE_OTHER): Payer: 59 | Admitting: Family Medicine

## 2018-10-10 DIAGNOSIS — E8881 Metabolic syndrome: Secondary | ICD-10-CM | POA: Diagnosis not present

## 2018-10-10 DIAGNOSIS — E7849 Other hyperlipidemia: Secondary | ICD-10-CM | POA: Diagnosis not present

## 2018-10-10 DIAGNOSIS — E282 Polycystic ovarian syndrome: Secondary | ICD-10-CM | POA: Diagnosis not present

## 2018-10-10 DIAGNOSIS — Z6833 Body mass index (BMI) 33.0-33.9, adult: Secondary | ICD-10-CM

## 2018-10-10 DIAGNOSIS — E669 Obesity, unspecified: Secondary | ICD-10-CM | POA: Diagnosis not present

## 2018-10-10 DIAGNOSIS — Z319 Encounter for procreative management, unspecified: Secondary | ICD-10-CM | POA: Diagnosis not present

## 2018-10-10 DIAGNOSIS — N970 Female infertility associated with anovulation: Secondary | ICD-10-CM | POA: Diagnosis not present

## 2018-10-11 NOTE — Progress Notes (Signed)
Office: [phone]  /  Fax: [phone] TeleHealth Visit:  Cynthia Copasatasha Figueroa has verbally consented to this TeleHealth visit today. The patient is located at home, the provider is located at the [facility] office. The participants in this visit include the listed provider and patient. The visit was conducted today via face time.  HPI:   Chief Complaint: OBESITY Cynthia Figueroa is here to discuss her progress with her obesity treatment plan. She is on the Category 2 plan and is following her eating plan approximately 50 % of the time. She states she is walking the dogs for 15-20 minutes 7 times per week. Mahati voices the meal plan for the last 2 weeks was ok. She is still struggling with GI side effects of metformin at higher dose. She is having a cookout for the 4th of July. She is looking for an alternative options at lunch.  We were unable to weigh the patient today for this TeleHealth visit. She feels as if she has lost 2 lbs since her last visit. She has lost 10 lbs since starting treatment with us.  Hyperlipidemia Cynthia Figueroa has hyperlipidemia and has been trying to improve her cholesterol levels with intensive lifestyle modification including a low saturated fat diet, exercise and weight loss. Last LDL was of 121 and HDL of 38. She is not on statin and denies any chest pain, claudication or myalgias.  Insulin Resistance Cynthia Figueroa has a diagnosis of insulin resistance based on her elevated fasting insulin level >5. Last insulin was of 19.9 5 months ago. Although Justene's blood glucose readings are still under good control, insulin resistance puts her at greater risk of metabolic syndrome and diabetes. She is on a higher dose of metformin prescribed by her fertility specialist. She continues to work on diet and exercise to decrease risk of diabetes.  ASSESSMENT AND PLAN:  Other hyperlipidemia  Insulin resistance  Class 1 obesity with serious comorbidity and body mass index (BMI)  of 33.0 to 33.9 in adult, unspecified obesity type  PLAN:  Hyperlipidemia Cynthia Figueroa was informed of the American Heart Association Guidelines emphasizing intensive lifestyle modifications as the first line treatment for hyperlipidemia. We discussed many lifestyle modifications today in depth, and Cynthia Figueroa will continue to work on decreasing saturated fats such as fatty red meat, butter and many fried foods. She will also increase vegetables and lean protein in her diet and continue to work on exercise and weight loss efforts. We will repeat labs at her first in office appointment. Cynthia Figueroa agrees to follow up with our clinic in 2 weeks.  Insulin Resistance Cynthia Figueroa will continue to work on weight loss, exercise, and decreasing simple carbohydrates in her diet to help decrease the risk of diabetes. We dicussed metformin including benefits and risks. She was informed that eating too many simple carbohydrates or too many calories at one sitting increases the likelihood of GI side effects. Cynthia Figueroa agrees to continue taking metformin, and we will repeat labs at her first in office appointment. Cynthia Figueroa agrees to follow up with our clinic in 2 weeks as directed to monitor her progress.  Obesity Cynthia Figueroa is currently in the action stage of change. As such, her goal is to continue with weight loss efforts She has agreed to follow the Category 2 plan Cynthia Figueroa has been instructed to work up to a goal of 150 minutes of combined cardio and strengthening exercise per week for weight loss and overall health benefits. We discussed the following Behavioral Modification Strategies today: increasing lean protein intake,  increasing vegetables and work on meal planning and easy cooking plans, keeping healthy foods in the home, and planning for success   Cynthia Figueroa has agreed to follow up with our clinic in 2 weeks. She was informed of the importance of frequent follow up visits to maximize her success with intensive lifestyle  modifications for her multiple health conditions.  ALLERGIES: Allergies  Allergen Reactions  . Lamisil [Terbinafine] Hives    MEDICATIONS: Current Outpatient Medications on File Prior to Visit  Medication Sig Dispense Refill  . ASHWAGANDHA PO Take 300 mg by mouth daily.    . Cholecalciferol (VITAMIN D3) 2000 units CHEW Chew by mouth.    . Cranberry 500 MG CAPS Take by mouth.    . dimenhyDRINATE (DRAMAMINE) 50 MG tablet Take 1 tablet (50 mg total) by mouth every 8 (eight) hours as needed. 30 tablet 0  . fluconazole (DIFLUCAN) 150 MG tablet Take 1 tablet (150 mg total) by mouth once a week. 4 tablet 0  . IRON PO Take 1 tablet by mouth daily.    Marland Kitchen. itraconazole (SPORANOX) 100 MG capsule Take 1 capsule (100 mg total) by mouth daily. 90 capsule 0  . MAGNESIUM PO Take 1 tablet by mouth daily.    . meclizine (ANTIVERT) 25 MG tablet Take 1 tablet (25 mg total) by mouth 3 (three) times daily as needed for dizziness. 30 tablet 0  . metFORMIN (GLUCOPHAGE) 500 MG tablet Take 1 tablet (500 mg total) by mouth daily with breakfast. 30 tablet 0  . Multiple Vitamins-Minerals (MULTIVITAMIN PO) Take by mouth.    . NON FORMULARY Lemon balm herb  330 mg /mL qd prn    . Probiotic Product (PROBIOTIC DAILY PO) Take by mouth.    . sertraline (ZOLOFT) 50 MG tablet Take 1 tablet (50 mg total) by mouth daily. 30 tablet 0  . Turmeric 500 MG CAPS Take by mouth.    . Vitamin D, Ergocalciferol, (DRISDOL) 1.25 MG (50000 UT) CAPS capsule Take 1 capsule (50,000 Units total) by mouth every 7 (seven) days. 4 capsule 0  . VITAMIN K PO Take 150 mcg by mouth daily.    Marland Kitchen. liraglutide (VICTOZA) 18 MG/3ML SOPN Inject 0.1 mLs (0.6 mg total) into the skin every morning. (Patient not taking: Reported on 10/10/2018) 1 pen 0  . Liraglutide -Weight Management (SAXENDA) 18 MG/3ML SOPN Inject 0.6 mg into the skin daily. (Patient not taking: Reported on 10/10/2018) 1 pen 0  . progesterone (PROMETRIUM) 100 MG capsule Take 100 mg by mouth  daily.     No current facility-administered medications on file prior to visit.     PAST MEDICAL HISTORY: Past Medical History:  Diagnosis Date  . Anxiety   . GERD (gastroesophageal reflux disease)   . Infertility, female   . PCOS (polycystic ovarian syndrome)   . Pre-diabetes   . Vaginismus   . Vitamin D deficiency     PAST SURGICAL HISTORY: History reviewed. No pertinent surgical history.  SOCIAL HISTORY: Social History   Tobacco Use  . Smoking status: Never Smoker  . Smokeless tobacco: Never Used  Substance Use Topics  . Alcohol use: No  . Drug use: No    FAMILY HISTORY: Family History  Problem Relation Age of Onset  . High blood pressure Mother   . Thyroid disease Mother   . Obesity Mother   . AAA (abdominal aortic aneurysm) Father     ROS: Review of Systems  Constitutional: Positive for weight loss.  Cardiovascular: Negative for chest  pain and claudication.  Musculoskeletal: Negative for myalgias.    PHYSICAL EXAM: Pt in no acute distress  RECENT LABS AND TESTS: BMET    Component Value Date/Time   NA 141 12/23/2017 1249   K 4.2 12/23/2017 1249   CL 103 12/23/2017 1249   CO2 23 12/23/2017 1249   GLUCOSE 72 12/23/2017 1249   GLUCOSE 110 (H) 08/04/2013 2248   BUN 9 12/23/2017 1249   CREATININE 0.81 12/23/2017 1249   CALCIUM 9.2 12/23/2017 1249   GFRNONAA 98 12/23/2017 1249   GFRAA 113 12/23/2017 1249   Lab Results  Component Value Date   HGBA1C 5.6 04/21/2018   HGBA1C 5.6 12/23/2017   Lab Results  Component Value Date   INSULIN 19.9 04/21/2018   INSULIN 13.4 12/23/2017   CBC    Component Value Date/Time   WBC 9.7 12/23/2017 1249   WBC 14.2 (H) 08/04/2013 2248   RBC 4.68 12/23/2017 1249   RBC 4.62 08/04/2013 2248   HGB 12.8 12/23/2017 1249   HCT 40.5 12/23/2017 1249   PLT 427 (H) 08/04/2013 2248   MCV 87 12/23/2017 1249   MCH 27.4 12/23/2017 1249   MCH 29.2 08/04/2013 2248   MCHC 31.6 12/23/2017 1249   MCHC 33.7 08/04/2013  2248   RDW 12.5 12/23/2017 1249   LYMPHSABS 2.6 12/23/2017 1249   MONOABS 0.8 08/04/2013 2239   EOSABS 0.1 12/23/2017 1249   BASOSABS 0.0 12/23/2017 1249   Iron/TIBC/Ferritin/ %Sat No results found for: IRON, TIBC, FERRITIN, IRONPCTSAT Lipid Panel     Component Value Date/Time   CHOL 174 04/21/2018 0856   TRIG 73 04/21/2018 0856   HDL 38 (L) 04/21/2018 0856   LDLCALC 121 (H) 04/21/2018 0856   Hepatic Function Panel     Component Value Date/Time   PROT 7.0 12/23/2017 1249   ALBUMIN 3.9 12/23/2017 1249   AST 14 12/23/2017 1249   ALT 12 12/23/2017 1249   ALKPHOS 93 12/23/2017 1249   BILITOT 0.3 12/23/2017 1249   BILIDIR <0.10 06/12/2016 1309      Component Value Date/Time   TSH 1.290 12/23/2017 1249      I, Trixie Dredge, am acting as transcriptionist for Ilene Qua, MD  I have reviewed the above documentation for accuracy and completeness, and I agree with the above. - Ilene Qua, MD

## 2018-10-12 ENCOUNTER — Encounter (INDEPENDENT_AMBULATORY_CARE_PROVIDER_SITE_OTHER): Payer: Self-pay

## 2018-10-18 ENCOUNTER — Ambulatory Visit: Payer: Self-pay | Admitting: Psychology

## 2018-10-19 MED FILL — LETROZOLE 2.5 MG TABLET: 2.5 | 30 days supply | Qty: 15 | Fill #2

## 2018-10-19 MED FILL — SERTRALINE HCL 50 MG TABLET: 50 | 30 days supply | Qty: 30 | Fill #0

## 2018-10-24 ENCOUNTER — Other Ambulatory Visit: Payer: Self-pay

## 2018-10-24 ENCOUNTER — Ambulatory Visit (INDEPENDENT_AMBULATORY_CARE_PROVIDER_SITE_OTHER): Payer: 59 | Admitting: Family Medicine

## 2018-10-24 ENCOUNTER — Encounter (INDEPENDENT_AMBULATORY_CARE_PROVIDER_SITE_OTHER): Payer: Self-pay | Admitting: Family Medicine

## 2018-10-24 DIAGNOSIS — E559 Vitamin D deficiency, unspecified: Secondary | ICD-10-CM | POA: Diagnosis not present

## 2018-10-24 DIAGNOSIS — E8881 Metabolic syndrome: Secondary | ICD-10-CM | POA: Diagnosis not present

## 2018-10-24 DIAGNOSIS — Z6834 Body mass index (BMI) 34.0-34.9, adult: Secondary | ICD-10-CM

## 2018-10-24 DIAGNOSIS — E669 Obesity, unspecified: Secondary | ICD-10-CM

## 2018-10-24 MED ORDER — VITAMIN D (ERGOCALCIFEROL) 1.25 MG (50000 UNIT) PO CAPS: 50000.0000 [IU] | ORAL_CAPSULE | ORAL | 0 refills | Status: DC

## 2018-10-25 NOTE — Progress Notes (Signed)
Office: [phone]  /  Fax: [phone] TeleHealth Visit:  Cynthia Figueroa has verbally consented to this TeleHealth visit today. The patient is located at home, the provider is located at the [facility] office. The participants in this visit include the listed provider and patient. The visit was conducted today via face time.  HPI:   Chief Complaint: OBESITY Cynthia Figueroa is here to discuss her progress with her obesity treatment plan. She is on the Category 2 plan and is following her eating plan approximately 75 % of the time. She states she is walking when she can. Cynthia Figueroa had an appointment with the fertility doctor and was not found to have IUP. The last few weeks has been very busy working in the office 2 days a week. She voices she may want to try limiting simple carbohydrate in preparation for possible next round of IUI.  We were unable to weigh the patient today for this TeleHealth visit. She feels as if she has maintained her weight since her last visit. She has lost 10 lbs since starting treatment with us.  Vitamin D Deficiency Cynthia Figueroa has a diagnosis of vitamin D deficiency. She is currently taking prescription Vit D. She notes fatigue and denies nausea, vomiting or muscle weakness.  Insulin Resistance Cynthia Figueroa has a diagnosis of insulin resistance based on her elevated fasting insulin level >5. Although Daishia's blood glucose readings are still under good control, insulin resistance puts her at greater risk of metabolic syndrome and diabetes. She wants to do a combination of the Low carbohydrate plan and Category 2 plan, as she may undergo another round of IUI. She notes minimal GI side effects of metformin and continues to work on diet and exercise to decrease risk of diabetes.  ASSESSMENT AND PLAN:  Insulin resistance  Vitamin D deficiency - Plan: Vitamin D, Ergocalciferol, (DRISDOL) 1.25 MG (50000 UT) CAPS capsule  Class 1 obesity with serious comorbidity and  body mass index (BMI) of 34.0 to 34.9 in adult, unspecified obesity type  PLAN:  Vitamin D Deficiency Cynthia Figueroa was informed that low vitamin D levels contributes to fatigue and are associated with obesity, breast, and colon cancer. Cynthia Figueroa agrees to continue taking prescription Vit D 50,000 IU every week #4 and we will refill for 1 month. She will follow up for routine testing of vitamin D, at least 2-3 times per year. She was informed of the risk of over-replacement of vitamin D and agrees to not increase her dose unless she discusses this with us first. Cynthia Figueroa agrees to follow up with our clinic in 2 to 3 weeks.  Insulin Resistance Cynthia Figueroa will continue to work on weight loss, exercise, and decreasing simple carbohydrates in her diet to help decrease the risk of diabetes. We dicussed metformin including benefits and risks. She was informed that eating too many simple carbohydrates or too many calories at one sitting increases the likelihood of GI side effects. Cynthia Figueroa agrees to continue taking metformin, and she agrees to follow up with our clinic in 2 to 3 weeks as directed to monitor her progress.  Obesity Cynthia Figueroa is currently in the action stage of change. As such, her goal is to continue with weight loss efforts She has agreed to follow the Category 2 plan Cynthia Figueroa has been instructed to work up to a goal of 150 minutes of combined cardio and strengthening exercise per week for weight loss and overall health benefits. We discussed the following Behavioral Modification Strategies today: increasing lean protein intake, increasing  vegetables and work on meal planning and easy cooking plans, keeping healthy foods in the home, and planning for success   Cynthia Figueroa has agreed to follow up with our clinic in 2 to 3 weeks. She was informed of the importance of frequent follow up visits to maximize her success with intensive lifestyle modifications for her multiple health conditions.  ALLERGIES:  Allergies  Allergen Reactions  . Lamisil [Terbinafine] Hives    MEDICATIONS: Current Outpatient Medications on File Prior to Visit  Medication Sig Dispense Refill  . Cranberry 500 MG CAPS Take by mouth.    . dimenhyDRINATE (DRAMAMINE) 50 MG tablet Take 1 tablet (50 mg total) by mouth every 8 (eight) hours as needed. 30 tablet 0  . fluconazole (DIFLUCAN) 150 MG tablet Take 1 tablet (150 mg total) by mouth once a week. 4 tablet 0  . IRON PO Take 1 tablet by mouth daily.    Marland Kitchen itraconazole (SPORANOX) 100 MG capsule Take 1 capsule (100 mg total) by mouth daily. 90 capsule 0  . MAGNESIUM PO Take 1 tablet by mouth daily.    . meclizine (ANTIVERT) 25 MG tablet Take 1 tablet (25 mg total) by mouth 3 (three) times daily as needed for dizziness. 30 tablet 0  . metFORMIN (GLUCOPHAGE) 500 MG tablet Take 1 tablet (500 mg total) by mouth daily with breakfast. (Patient taking differently: Take 1,000 mg by mouth 2 (two) times daily. ) 30 tablet 0  . Multiple Vitamins-Minerals (MULTIVITAMIN PO) Take by mouth.    . NON FORMULARY Lemon balm herb  330 mg /mL qd prn    . Probiotic Product (PROBIOTIC DAILY PO) Take by mouth.    . progesterone (PROMETRIUM) 100 MG capsule Take 100 mg by mouth daily.    . sertraline (ZOLOFT) 50 MG tablet Take 1 tablet (50 mg total) by mouth daily. 30 tablet 0  . Turmeric 500 MG CAPS Take by mouth.    Marland Kitchen VITAMIN K PO Take 150 mcg by mouth daily.    . ASHWAGANDHA PO Take 300 mg by mouth daily.    . Cholecalciferol (VITAMIN D3) 2000 units CHEW Chew by mouth.     No current facility-administered medications on file prior to visit.     PAST MEDICAL HISTORY: Past Medical History:  Diagnosis Date  . Anxiety   . GERD (gastroesophageal reflux disease)   . Infertility, female   . PCOS (polycystic ovarian syndrome)   . Pre-diabetes   . Vaginismus   . Vitamin D deficiency     PAST SURGICAL HISTORY: History reviewed. No pertinent surgical history.  SOCIAL HISTORY: Social  History   Tobacco Use  . Smoking status: Never Smoker  . Smokeless tobacco: Never Used  Substance Use Topics  . Alcohol use: No  . Drug use: No    FAMILY HISTORY: Family History  Problem Relation Age of Onset  . High blood pressure Mother   . Thyroid disease Mother   . Obesity Mother   . AAA (abdominal aortic aneurysm) Father     ROS: Review of Systems  Constitutional: Positive for malaise/fatigue. Negative for weight loss.  Gastrointestinal: Negative for nausea and vomiting.  Musculoskeletal:       Negative muscle weakness    PHYSICAL EXAM: Pt in no acute distress  RECENT LABS AND TESTS: BMET    Component Value Date/Time   NA 141 12/23/2017 1249   K 4.2 12/23/2017 1249   CL 103 12/23/2017 1249   CO2 23 12/23/2017 1249   GLUCOSE  72 12/23/2017 1249   GLUCOSE 110 (H) 08/04/2013 2248   BUN 9 12/23/2017 1249   CREATININE 0.81 12/23/2017 1249   CALCIUM 9.2 12/23/2017 1249   GFRNONAA 98 12/23/2017 1249   GFRAA 113 12/23/2017 1249   Lab Results  Component Value Date   HGBA1C 5.6 04/21/2018   HGBA1C 5.6 12/23/2017   Lab Results  Component Value Date   INSULIN 19.9 04/21/2018   INSULIN 13.4 12/23/2017   CBC    Component Value Date/Time   WBC 9.7 12/23/2017 1249   WBC 14.2 (H) 08/04/2013 2248   RBC 4.68 12/23/2017 1249   RBC 4.62 08/04/2013 2248   HGB 12.8 12/23/2017 1249   HCT 40.5 12/23/2017 1249   PLT 427 (H) 08/04/2013 2248   MCV 87 12/23/2017 1249   MCH 27.4 12/23/2017 1249   MCH 29.2 08/04/2013 2248   MCHC 31.6 12/23/2017 1249   MCHC 33.7 08/04/2013 2248   RDW 12.5 12/23/2017 1249   LYMPHSABS 2.6 12/23/2017 1249   MONOABS 0.8 08/04/2013 2239   EOSABS 0.1 12/23/2017 1249   BASOSABS 0.0 12/23/2017 1249   Iron/TIBC/Ferritin/ %Sat No results found for: IRON, TIBC, FERRITIN, IRONPCTSAT Lipid Panel     Component Value Date/Time   CHOL 174 04/21/2018 0856   TRIG 73 04/21/2018 0856   HDL 38 (L) 04/21/2018 0856   LDLCALC 121 (H) 04/21/2018 0856    Hepatic Function Panel     Component Value Date/Time   PROT 7.0 12/23/2017 1249   ALBUMIN 3.9 12/23/2017 1249   AST 14 12/23/2017 1249   ALT 12 12/23/2017 1249   ALKPHOS 93 12/23/2017 1249   BILITOT 0.3 12/23/2017 1249   BILIDIR <0.10 06/12/2016 1309      Component Value Date/Time   TSH 1.290 12/23/2017 1249      I, Burt KnackSharon Martin, am acting as transcriptionist for Debbra RidingAlexandria Kadolph, MD  I have reviewed the above documentation for accuracy and completeness, and I agree with the above. - Debbra RidingAlexandria Kadolph, MD

## 2018-10-27 ENCOUNTER — Encounter: Payer: 59 | Admitting: Nurse Practitioner

## 2018-10-28 ENCOUNTER — Telehealth: Payer: Self-pay

## 2018-10-28 NOTE — Telephone Encounter (Signed)
LVM for pt to call the office to have missed appt rescheduled 10/28/2018

## 2018-10-31 DIAGNOSIS — E282 Polycystic ovarian syndrome: Secondary | ICD-10-CM | POA: Diagnosis not present

## 2018-10-31 DIAGNOSIS — Z3189 Encounter for other procreative management: Secondary | ICD-10-CM | POA: Diagnosis not present

## 2018-10-31 DIAGNOSIS — N970 Female infertility associated with anovulation: Secondary | ICD-10-CM | POA: Diagnosis not present

## 2018-10-31 DIAGNOSIS — Z319 Encounter for procreative management, unspecified: Secondary | ICD-10-CM | POA: Diagnosis not present

## 2018-11-01 DIAGNOSIS — E282 Polycystic ovarian syndrome: Secondary | ICD-10-CM | POA: Diagnosis not present

## 2018-11-01 DIAGNOSIS — Z319 Encounter for procreative management, unspecified: Secondary | ICD-10-CM | POA: Diagnosis not present

## 2018-11-01 DIAGNOSIS — Z3189 Encounter for other procreative management: Secondary | ICD-10-CM | POA: Diagnosis not present

## 2018-11-01 DIAGNOSIS — N970 Female infertility associated with anovulation: Secondary | ICD-10-CM | POA: Diagnosis not present

## 2018-11-08 DIAGNOSIS — Z319 Encounter for procreative management, unspecified: Secondary | ICD-10-CM | POA: Diagnosis not present

## 2018-11-08 DIAGNOSIS — N970 Female infertility associated with anovulation: Secondary | ICD-10-CM | POA: Diagnosis not present

## 2018-11-08 DIAGNOSIS — E282 Polycystic ovarian syndrome: Secondary | ICD-10-CM | POA: Diagnosis not present

## 2018-11-10 ENCOUNTER — Other Ambulatory Visit: Payer: Self-pay

## 2018-11-10 ENCOUNTER — Encounter (INDEPENDENT_AMBULATORY_CARE_PROVIDER_SITE_OTHER): Payer: Self-pay | Admitting: Family Medicine

## 2018-11-10 ENCOUNTER — Telehealth (INDEPENDENT_AMBULATORY_CARE_PROVIDER_SITE_OTHER): Payer: 59 | Admitting: Family Medicine

## 2018-11-10 DIAGNOSIS — E8881 Metabolic syndrome: Secondary | ICD-10-CM

## 2018-11-10 DIAGNOSIS — F418 Other specified anxiety disorders: Secondary | ICD-10-CM

## 2018-11-10 DIAGNOSIS — E88819 Insulin resistance, unspecified: Secondary | ICD-10-CM

## 2018-11-10 DIAGNOSIS — E669 Obesity, unspecified: Secondary | ICD-10-CM

## 2018-11-10 DIAGNOSIS — Z6834 Body mass index (BMI) 34.0-34.9, adult: Secondary | ICD-10-CM | POA: Diagnosis not present

## 2018-11-10 MED ORDER — SERTRALINE HCL 50 MG PO TABS: 50.0000 mg | ORAL_TABLET | Freq: Every day | ORAL | 0 refills | Status: DC

## 2018-11-10 MED ORDER — METFORMIN HCL 500 MG PO TABS: 1000.0000 mg | ORAL_TABLET | Freq: Two times a day (BID) | ORAL | 0 refills | Status: DC

## 2018-11-13 NOTE — Progress Notes (Signed)
Office: [phone]  /  Fax: [phone] TeleHealth Visit:  Cynthia Figueroa has verbally consented to this TeleHealth visit today. The patient is located at home, the provider is located at the [facility] office. The participants in this visit include the listed provider and patient. The visit was conducted today via face time.  HPI:   Chief Complaint: OBESITY Cynthia Figueroa is here to discuss her progress with her obesity treatment plan. She is on the Category 2 plan and is following her eating plan approximately 50 % of the time. She states she is exercising 0 minutes 0 times per week. Cynthia Figueroa has had an emotional few weeks, secondary to hormone treatment for IUI and reproduction technologies.  We were unable to weigh the patient today for this TeleHealth visit. She feels as if she has gained 5 lbs since her last visit. She has lost 10 lbs since starting treatment with us.  Pre-Diabetes Cynthia Figueroa has a diagnosis of pre-diabetes based on her elevated Hgb A1c and was informed this puts her at greater risk of developing diabetes. She denies GI side effects with increased dose of metformin. She is on currently on 1,000 mg BID. She continues to work on diet and exercise to decrease risk of diabetes.   Depression with Anxiety Cynthia Figueroa notes her symptoms are somewhat controlled, but she voices increase in emotions. Not sure if that is due to reproductive technology or anxiety. She shows no sign of suicidal or homicidal ideations.  ASSESSMENT AND PLAN:  Class 1 obesity with serious comorbidity and body mass index (BMI) of 34.0 to 34.9 in adult, unspecified obesity type  Insulin resistance - Plan: metFORMIN (GLUCOPHAGE) 500 MG tablet  Depression with anxiety - Plan: sertraline (ZOLOFT) 50 MG tablet  PLAN:  Pre-Diabetes Cynthia Figueroa will continue to work on weight loss, exercise, and decreasing simple carbohydrates in her diet to help decrease the risk of diabetes. We dicussed metformin  including benefits and risks. She was informed that eating too many simple carbohydrates or too many calories at one sitting increases the likelihood of GI side effects. Cynthia Figueroa agrees to continue taking metformin 1,000 mg PO BID #60 and we will refill for 1 month. Cynthia Figueroa agrees to follow up with our clinic in 3 weeks as directed to monitor her progress.  Depression with Anxiety We discussed behavior modification techniques today to help Cynthia Figueroa deal with her depression and anxiety. Cynthia Figueroa agrees to continue taking Zoloft 50 mg PO q daily #30 and we will refill for 1 month, may increase to 75 mg PO daily if her symptoms worsen. Cynthia Figueroa agrees to follow up with our clinic in 3 weeks.  Obesity Cynthia Figueroa is currently in the action stage of change. As such, her goal is to continue with weight loss efforts She has agreed to follow the Category 2 plan Cynthia Figueroa has been instructed to work up to a goal of 150 minutes of combined cardio and strengthening exercise per week for weight loss and overall health benefits. We discussed the following Behavioral Modification Strategies today: increasing lean protein intake, increasing vegetables and work on meal planning and easy cooking plans, keeping healthy foods in the home, and planning for success   Cynthia Figueroa has agreed to follow up with our clinic in 3 weeks. She was informed of the importance of frequent follow up visits to maximize her success with intensive lifestyle modifications for her multiple health conditions.  ALLERGIES: Allergies  Allergen Reactions   Lamisil [Terbinafine] Hives    MEDICATIONS: Current Outpatient Medications  on File Prior to Visit  Medication Sig Dispense Refill   ASHWAGANDHA PO Take 300 mg by mouth daily.     Cholecalciferol (VITAMIN D3) 2000 units CHEW Chew by mouth.     Cranberry 500 MG CAPS Take by mouth.     dimenhyDRINATE (DRAMAMINE) 50 MG tablet Take 1 tablet (50 mg total) by mouth every 8 (eight) hours as needed.  30 tablet 0   fluconazole (DIFLUCAN) 150 MG tablet Take 1 tablet (150 mg total) by mouth once a week. 4 tablet 0   IRON PO Take 1 tablet by mouth daily.     itraconazole (SPORANOX) 100 MG capsule Take 1 capsule (100 mg total) by mouth daily. 90 capsule 0   MAGNESIUM PO Take 1 tablet by mouth daily.     meclizine (ANTIVERT) 25 MG tablet Take 1 tablet (25 mg total) by mouth 3 (three) times daily as needed for dizziness. 30 tablet 0   Multiple Vitamins-Minerals (MULTIVITAMIN PO) Take by mouth.     NON FORMULARY Lemon balm herb  330 mg /mL qd prn     Probiotic Product (PROBIOTIC DAILY PO) Take by mouth.     progesterone (PROMETRIUM) 100 MG capsule Take 100 mg by mouth daily.     Turmeric 500 MG CAPS Take by mouth.     Vitamin D, Ergocalciferol, (DRISDOL) 1.25 MG (50000 UT) CAPS capsule Take 1 capsule (50,000 Units total) by mouth every 7 (seven) days. 4 capsule 0   VITAMIN K PO Take 150 mcg by mouth daily.     No current facility-administered medications on file prior to visit.     PAST MEDICAL HISTORY: Past Medical History:  Diagnosis Date   Anxiety    GERD (gastroesophageal reflux disease)    Infertility, female    PCOS (polycystic ovarian syndrome)    Pre-diabetes    Vaginismus    Vitamin D deficiency     PAST SURGICAL HISTORY: History reviewed. No pertinent surgical history.  SOCIAL HISTORY: Social History   Tobacco Use   Smoking status: Never Smoker   Smokeless tobacco: Never Used  Substance Use Topics   Alcohol use: No   Drug use: No    FAMILY HISTORY: Family History  Problem Relation Age of Onset   High blood pressure Mother    Thyroid disease Mother    Obesity Mother    AAA (abdominal aortic aneurysm) Father     ROS: Review of Systems  Constitutional: Negative for weight loss.  Psychiatric/Behavioral: Positive for depression. Negative for suicidal ideas.       + Anxiety    PHYSICAL EXAM: Pt in no acute distress  RECENT LABS  AND TESTS: BMET    Component Value Date/Time   NA 141 12/23/2017 1249   K 4.2 12/23/2017 1249   CL 103 12/23/2017 1249   CO2 23 12/23/2017 1249   GLUCOSE 72 12/23/2017 1249   GLUCOSE 110 (H) 08/04/2013 2248   BUN 9 12/23/2017 1249   CREATININE 0.81 12/23/2017 1249   CALCIUM 9.2 12/23/2017 1249   GFRNONAA 98 12/23/2017 1249   GFRAA 113 12/23/2017 1249   Lab Results  Component Value Date   HGBA1C 5.6 04/21/2018   HGBA1C 5.6 12/23/2017   Lab Results  Component Value Date   INSULIN 19.9 04/21/2018   INSULIN 13.4 12/23/2017   CBC    Component Value Date/Time   WBC 9.7 12/23/2017 1249   WBC 14.2 (H) 08/04/2013 2248   RBC 4.68 12/23/2017 1249   RBC 4.62  08/04/2013 2248   HGB 12.8 12/23/2017 1249   HCT 40.5 12/23/2017 1249   PLT 427 (H) 08/04/2013 2248   MCV 87 12/23/2017 1249   MCH 27.4 12/23/2017 1249   MCH 29.2 08/04/2013 2248   MCHC 31.6 12/23/2017 1249   MCHC 33.7 08/04/2013 2248   RDW 12.5 12/23/2017 1249   LYMPHSABS 2.6 12/23/2017 1249   MONOABS 0.8 08/04/2013 2239   EOSABS 0.1 12/23/2017 1249   BASOSABS 0.0 12/23/2017 1249   Iron/TIBC/Ferritin/ %Sat No results found for: IRON, TIBC, FERRITIN, IRONPCTSAT Lipid Panel     Component Value Date/Time   CHOL 174 04/21/2018 0856   TRIG 73 04/21/2018 0856   HDL 38 (L) 04/21/2018 0856   LDLCALC 121 (H) 04/21/2018 0856   Hepatic Function Panel     Component Value Date/Time   PROT 7.0 12/23/2017 1249   ALBUMIN 3.9 12/23/2017 1249   AST 14 12/23/2017 1249   ALT 12 12/23/2017 1249   ALKPHOS 93 12/23/2017 1249   BILITOT 0.3 12/23/2017 1249   BILIDIR <0.10 06/12/2016 1309      Component Value Date/Time   TSH 1.290 12/23/2017 1249      I, Trixie Dredge, am acting as transcriptionist for Ilene Qua, MD   I have reviewed the above documentation for accuracy and completeness, and I agree with the above. - Ilene Qua, MD

## 2018-11-15 MED FILL — traMADol HCL 50 MG TABS: 50 | 5 days supply | Qty: 20 | Fill #0

## 2018-11-18 MED FILL — OVIDREL 250 MCG/0.5 ML SYRG: 250 | 30 days supply | Qty: 1 | Fill #0

## 2018-11-18 MED FILL — metFORMIN HCL 500 MG TABS: 500 | 30 days supply | Qty: 120 | Fill #0

## 2018-11-18 MED FILL — SERTRALINE HCL 50 MG TABLET: 50 | 30 days supply | Qty: 30 | Fill #0

## 2018-11-18 MED FILL — VIT D2 1.25 MG (50,000 UNIT: 1.25 MG | 28 days supply | Qty: 4 | Fill #0

## 2018-11-18 MED FILL — LETROZOLE 2.5 MG TABS: 2.5 | 30 days supply | Qty: 15 | Fill #0

## 2018-11-24 DIAGNOSIS — N970 Female infertility associated with anovulation: Secondary | ICD-10-CM | POA: Diagnosis not present

## 2018-11-24 DIAGNOSIS — Z319 Encounter for procreative management, unspecified: Secondary | ICD-10-CM | POA: Diagnosis not present

## 2018-11-24 DIAGNOSIS — E282 Polycystic ovarian syndrome: Secondary | ICD-10-CM | POA: Diagnosis not present

## 2018-12-01 ENCOUNTER — Telehealth (INDEPENDENT_AMBULATORY_CARE_PROVIDER_SITE_OTHER): Payer: 59 | Admitting: Family Medicine

## 2018-12-01 ENCOUNTER — Other Ambulatory Visit: Payer: Self-pay

## 2018-12-05 DIAGNOSIS — F430 Acute stress reaction: Secondary | ICD-10-CM | POA: Diagnosis not present

## 2018-12-12 DIAGNOSIS — F430 Acute stress reaction: Secondary | ICD-10-CM | POA: Diagnosis not present

## 2018-12-13 ENCOUNTER — Other Ambulatory Visit: Payer: Self-pay

## 2018-12-13 ENCOUNTER — Ambulatory Visit (INDEPENDENT_AMBULATORY_CARE_PROVIDER_SITE_OTHER): Payer: 59 | Admitting: Family Medicine

## 2018-12-13 DIAGNOSIS — F418 Other specified anxiety disorders: Secondary | ICD-10-CM | POA: Diagnosis not present

## 2018-12-13 DIAGNOSIS — Z6834 Body mass index (BMI) 34.0-34.9, adult: Secondary | ICD-10-CM

## 2018-12-13 DIAGNOSIS — E559 Vitamin D deficiency, unspecified: Secondary | ICD-10-CM | POA: Diagnosis not present

## 2018-12-13 DIAGNOSIS — E669 Obesity, unspecified: Secondary | ICD-10-CM

## 2018-12-13 MED ORDER — SERTRALINE HCL 50 MG PO TABS: 75.0000 mg | ORAL_TABLET | Freq: Every day | ORAL | 0 refills | Status: DC

## 2018-12-13 MED ORDER — VITAMIN D (ERGOCALCIFEROL) 1.25 MG (50000 UNIT) PO CAPS: 50000.0000 [IU] | ORAL_CAPSULE | ORAL | 0 refills | Status: DC

## 2018-12-13 MED FILL — SERTRALINE HCL 50 MG TABLET: 50 | 30 days supply | Qty: 45 | Fill #0

## 2018-12-13 MED FILL — VIT D2 1.25 MG (50,000 UNIT: 1.25 MG | 28 days supply | Qty: 4 | Fill #0

## 2018-12-13 NOTE — Progress Notes (Signed)
Office: [phone]  /  Fax: [phone] TeleHealth Visit:  Soo Boivin has verbally consented to this TeleHealth visit today. The patient is located at home, the provider is located at the [facility] office. The participants in this visit include the listed provider and patient. The visit was conducted today via face time.  HPI:   Chief Complaint: OBESITY Isabell is here to discuss her progress with her obesity treatment plan. She is on the Category 2 plan and is following her eating plan approximately 75 % of the time. She states she is doing yoga for 25 minutes 3 times per week. Naema voices work has been stressful with schedule changes and expectations changes. She had to stop hormones secondary to ovarian cysts. She has no obstacles in the next few weeks. No weight reported.  We were unable to weigh the patient today for this TeleHealth visit. She is unsure if she has lost or gained weight since her last visit. She has lost 10 lbs since starting treatment with [place].  Vitamin D Deficiency Karianna has a diagnosis of vitamin D deficiency. She is currently taking prescription Vit D. She notes fatigue and denies nausea, vomiting or muscle weakness.  Depression with Anxiety Tomasa struggles with depression and anxiety. She notes her symptoms have worsened, so she is up to 75 mg Zoloft. She shows no sign of suicidal or homicidal ideations.  ASSESSMENT AND PLAN:  Class 1 obesity with serious comorbidity and body mass index (BMI) of 34.0 to 34.9 in adult, unspecified obesity type  Depression with anxiety - Plan: sertraline (ZOLOFT) 50 MG tablet  Vitamin D deficiency - Plan: Vitamin D, Ergocalciferol, (DRISDOL) 1.25 MG (50000 UT) CAPS capsule  PLAN:  Vitamin D Deficiency Jamette was informed that low vitamin D levels contributes to fatigue and are associated with obesity, breast, and colon cancer. Jennika agrees to continue taking prescription Vit D 50,000 IU every  week #4 and we will refill for 1 month. She will follow up for routine testing of vitamin D, at least 2-3 times per year. She was informed of the risk of over-replacement of vitamin D and agrees to not increase her dose unless she discusses this with [place] first. Shaquasha agrees to follow up with our clinic in 2 weeks.  Depression with Anxiety We discussed behavior modification techniques today to help Sakai deal with her depression and anxiety. Joycie agrees to continue taking Zoloft 75 mg PO daily #45 and we will refill for 1 month. Maka agrees to follow up with our clinic in 2 weeks.  Obesity Ceres is currently in the action stage of change. As such, her goal is to continue with weight loss efforts She has agreed to follow the Category 2 plan or follow the Pescatarian eating plan Riyanshi has been instructed to work up to a goal of 150 minutes of combined cardio and strengthening exercise per week for weight loss and overall health benefits. We discussed the following Behavioral Modification Strategies today: increasing lean protein intake, increasing vegetables and work on meal planning and easy cooking plans, better snacking choices, and planning for success   Lakeisha has agreed to follow up with our clinic in 2 weeks. She was informed of the importance of frequent follow up visits to maximize her success with intensive lifestyle modifications for her multiple health conditions.  ALLERGIES: Allergies  Allergen Reactions   Lamisil [Terbinafine] Hives    MEDICATIONS: Current Outpatient Medications on File Prior to Visit  Medication Sig Dispense Refill  ASHWAGANDHA PO Take 300 mg by mouth daily.     Cholecalciferol (VITAMIN D3) 2000 units CHEW Chew by mouth.     Cranberry 500 MG CAPS Take by mouth.     dimenhyDRINATE (DRAMAMINE) 50 MG tablet Take 1 tablet (50 mg total) by mouth every 8 (eight) hours as needed. 30 tablet 0   fluconazole (DIFLUCAN) 150 MG tablet Take 1 tablet  (150 mg total) by mouth once a week. 4 tablet 0   IRON PO Take 1 tablet by mouth daily.     itraconazole (SPORANOX) 100 MG capsule Take 1 capsule (100 mg total) by mouth daily. 90 capsule 0   MAGNESIUM PO Take 1 tablet by mouth daily.     meclizine (ANTIVERT) 25 MG tablet Take 1 tablet (25 mg total) by mouth 3 (three) times daily as needed for dizziness. 30 tablet 0   Multiple Vitamins-Minerals (MULTIVITAMIN PO) Take by mouth.     NON FORMULARY Lemon balm herb  330 mg /mL qd prn     Probiotic Product (PROBIOTIC DAILY PO) Take by mouth.     progesterone (PROMETRIUM) 100 MG capsule Take 100 mg by mouth daily.     Turmeric 500 MG CAPS Take by mouth.     VITAMIN K PO Take 150 mcg by mouth daily.     metFORMIN (GLUCOPHAGE) 500 MG tablet Take 2 tablets (1,000 mg total) by mouth 2 (two) times daily. 120 tablet 0   No current facility-administered medications on file prior to visit.     PAST MEDICAL HISTORY: Past Medical History:  Diagnosis Date   Anxiety    GERD (gastroesophageal reflux disease)    Infertility, female    PCOS (polycystic ovarian syndrome)    Pre-diabetes    Vaginismus    Vitamin D deficiency     PAST SURGICAL HISTORY: No past surgical history on file.  SOCIAL HISTORY: Social History   Tobacco Use   Smoking status: Never Smoker   Smokeless tobacco: Never Used  Substance Use Topics   Alcohol use: No   Drug use: No    FAMILY HISTORY: Family History  Problem Relation Age of Onset   High blood pressure Mother    Thyroid disease Mother    Obesity Mother    AAA (abdominal aortic aneurysm) Father     ROS: Review of Systems  Constitutional: Positive for malaise/fatigue.  Gastrointestinal: Negative for nausea and vomiting.  Musculoskeletal:       Negative muscle weakness  Psychiatric/Behavioral: Positive for depression. Negative for suicidal ideas.       + Anxiety    PHYSICAL EXAM: Pt in no acute distress  RECENT LABS AND  TESTS: BMET    Component Value Date/Time   NA 141 12/23/2017 1249   K 4.2 12/23/2017 1249   CL 103 12/23/2017 1249   CO2 23 12/23/2017 1249   GLUCOSE 72 12/23/2017 1249   GLUCOSE 110 (H) 08/04/2013 2248   BUN 9 12/23/2017 1249   CREATININE 0.81 12/23/2017 1249   CALCIUM 9.2 12/23/2017 1249   GFRNONAA 98 12/23/2017 1249   GFRAA 113 12/23/2017 1249   Lab Results  Component Value Date   HGBA1C 5.6 04/21/2018   HGBA1C 5.6 12/23/2017   Lab Results  Component Value Date   INSULIN 19.9 04/21/2018   INSULIN 13.4 12/23/2017   CBC    Component Value Date/Time   WBC 9.7 12/23/2017 1249   WBC 14.2 (H) 08/04/2013 2248   RBC 4.68 12/23/2017 1249   RBC 4.62  08/04/2013 2248   HGB 12.8 12/23/2017 1249   HCT 40.5 12/23/2017 1249   PLT 427 (H) 08/04/2013 2248   MCV 87 12/23/2017 1249   MCH 27.4 12/23/2017 1249   MCH 29.2 08/04/2013 2248   MCHC 31.6 12/23/2017 1249   MCHC 33.7 08/04/2013 2248   RDW 12.5 12/23/2017 1249   LYMPHSABS 2.6 12/23/2017 1249   MONOABS 0.8 08/04/2013 2239   EOSABS 0.1 12/23/2017 1249   BASOSABS 0.0 12/23/2017 1249   Iron/TIBC/Ferritin/ %Sat No results found for: IRON, TIBC, FERRITIN, IRONPCTSAT Lipid Panel     Component Value Date/Time   CHOL 174 04/21/2018 0856   TRIG 73 04/21/2018 0856   HDL 38 (L) 04/21/2018 0856   LDLCALC 121 (H) 04/21/2018 0856   Hepatic Function Panel     Component Value Date/Time   PROT 7.0 12/23/2017 1249   ALBUMIN 3.9 12/23/2017 1249   AST 14 12/23/2017 1249   ALT 12 12/23/2017 1249   ALKPHOS 93 12/23/2017 1249   BILITOT 0.3 12/23/2017 1249   BILIDIR <0.10 06/12/2016 1309      Component Value Date/Time   TSH 1.290 12/23/2017 1249      I, Burt KnackSharon Martin, am acting as transcriptionist for Debbra RidingAlexandria Kadolph, MD  I have reviewed the above documentation for accuracy and completeness, and I agree with the above. - Debbra RidingAlexandria Kadolph, MD

## 2018-12-16 MED FILL — LETROZOLE 2.5 MG TABS: 2.5 | 5 days supply | Qty: 15 | Fill #0

## 2018-12-16 MED FILL — OVIDREL 250 MCG/0.5 ML SYRG: 250 | 1 days supply | Qty: 1 | Fill #0

## 2018-12-20 DIAGNOSIS — Z3189 Encounter for other procreative management: Secondary | ICD-10-CM | POA: Diagnosis not present

## 2018-12-26 DIAGNOSIS — Z3189 Encounter for other procreative management: Secondary | ICD-10-CM | POA: Diagnosis not present

## 2018-12-27 ENCOUNTER — Encounter (INDEPENDENT_AMBULATORY_CARE_PROVIDER_SITE_OTHER): Payer: Self-pay

## 2018-12-27 ENCOUNTER — Other Ambulatory Visit: Payer: Self-pay

## 2018-12-27 ENCOUNTER — Ambulatory Visit (INDEPENDENT_AMBULATORY_CARE_PROVIDER_SITE_OTHER): Payer: 59 | Admitting: Family Medicine

## 2018-12-27 DIAGNOSIS — N942 Vaginismus: Secondary | ICD-10-CM | POA: Diagnosis not present

## 2018-12-27 DIAGNOSIS — F430 Acute stress reaction: Secondary | ICD-10-CM | POA: Diagnosis not present

## 2019-01-03 DIAGNOSIS — F430 Acute stress reaction: Secondary | ICD-10-CM | POA: Diagnosis not present

## 2019-01-10 DIAGNOSIS — F430 Acute stress reaction: Secondary | ICD-10-CM | POA: Diagnosis not present

## 2019-01-18 ENCOUNTER — Telehealth (INDEPENDENT_AMBULATORY_CARE_PROVIDER_SITE_OTHER): Payer: 59 | Admitting: Family Medicine

## 2019-01-18 ENCOUNTER — Other Ambulatory Visit: Payer: Self-pay

## 2019-01-18 DIAGNOSIS — E559 Vitamin D deficiency, unspecified: Secondary | ICD-10-CM | POA: Diagnosis not present

## 2019-01-18 DIAGNOSIS — E8881 Metabolic syndrome: Secondary | ICD-10-CM

## 2019-01-18 DIAGNOSIS — E669 Obesity, unspecified: Secondary | ICD-10-CM

## 2019-01-18 DIAGNOSIS — F418 Other specified anxiety disorders: Secondary | ICD-10-CM | POA: Diagnosis not present

## 2019-01-18 DIAGNOSIS — Z6833 Body mass index (BMI) 33.0-33.9, adult: Secondary | ICD-10-CM | POA: Diagnosis not present

## 2019-01-18 MED ORDER — SERTRALINE HCL 50 MG PO TABS: 75.0000 mg | ORAL_TABLET | Freq: Every day | ORAL | 0 refills | Status: DC

## 2019-01-18 MED ORDER — METFORMIN HCL 500 MG PO TABS: 1000.0000 mg | ORAL_TABLET | Freq: Two times a day (BID) | ORAL | 0 refills | Status: DC

## 2019-01-18 MED ORDER — VITAMIN D (ERGOCALCIFEROL) 1.25 MG (50000 UNIT) PO CAPS: 50000.0000 [IU] | ORAL_CAPSULE | ORAL | 0 refills | Status: DC

## 2019-01-18 MED FILL — VIT D2 1.25 MG (50,000 UNIT: 1.25 MG | 28 days supply | Qty: 4 | Fill #0

## 2019-01-18 MED FILL — SERTRALINE HCL 50 MG TABLET: 50 | 30 days supply | Qty: 45 | Fill #0

## 2019-01-18 MED FILL — metFORMIN HCL 500 MG TABS: 500 | 30 days supply | Qty: 120 | Fill #0

## 2019-01-19 LAB — COMPREHENSIVE METABOLIC PANEL
ALT: 13 IU/L (ref 0–32)
AST: 16 IU/L (ref 0–40)
Albumin/Globulin Ratio: 1.6 (ref 1.2–2.2)
Albumin: 4.4 g/dL (ref 3.8–4.8)
Alkaline Phosphatase: 98 IU/L (ref 39–117)
BUN/Creatinine Ratio: 10 (ref 9–23)
BUN: 10 mg/dL (ref 6–20)
Bilirubin Total: 0.2 mg/dL (ref 0.0–1.2)
CO2: 25 mmol/L (ref 20–29)
Calcium: 9.7 mg/dL (ref 8.7–10.2)
Chloride: 105 mmol/L (ref 96–106)
Creatinine, Ser: 0.96 mg/dL (ref 0.57–1.00)
GFR calc Af Amer: 91 mL/min/{1.73_m2} (ref >59)
GFR calc non Af Amer: 79 mL/min/{1.73_m2} (ref >59)
Globulin, Total: 2.7 g/dL (ref 1.5–4.5)
Glucose: 82 mg/dL (ref 65–99)
Potassium: 4.1 mmol/L (ref 3.5–5.2)
Sodium: 144 mmol/L (ref 134–144)
Total Protein: 7.1 g/dL (ref 6.0–8.5)

## 2019-01-19 LAB — LIPID PANEL WITH LDL/HDL RATIO
Cholesterol, Total: 157 mg/dL (ref 100–199)
HDL: 30 mg/dL — ABNORMAL LOW (ref >39)
LDL Chol Calc (NIH): 104 mg/dL — ABNORMAL HIGH (ref 0–99)
LDL/HDL Ratio: 3.5 ratio — ABNORMAL HIGH (ref 0.0–3.2)
Triglycerides: 124 mg/dL (ref 0–149)
VLDL Cholesterol Cal: 23 mg/dL (ref 5–40)

## 2019-01-19 LAB — VITAMIN D 25 HYDROXY (VIT D DEFICIENCY, FRACTURES): Vit D, 25-Hydroxy: 57.1 ng/mL (ref 30.0–100.0)

## 2019-01-19 LAB — HEMOGLOBIN A1C
Est. average glucose Bld gHb Est-mCnc: 120 mg/dL
Hgb A1c MFr Bld: 5.8 % — ABNORMAL HIGH (ref 4.8–5.6)

## 2019-01-19 LAB — INSULIN, RANDOM: INSULIN: 15.5 u[IU]/mL (ref 2.6–24.9)

## 2019-01-19 LAB — VITAMIN B12: Vitamin B-12: 1286 pg/mL — ABNORMAL HIGH (ref 232–1245)

## 2019-01-19 NOTE — Progress Notes (Signed)
Office: [phone]  /  Fax: [phone] TeleHealth Visit:  Cynthia Figueroa has verbally consented to this TeleHealth visit today. The patient is located at home, the provider is located at the [facility] office. The participants in this visit include the listed provider and patient. The visit was conducted today via face time.  HPI:   Chief Complaint: OBESITY Cynthia Figueroa is here to discuss her progress with her obesity treatment plan. She is on the Category 2 plan or follow the Pescatarian eating plan and is following her eating plan approximately 100 % of the time. She states she is exercising 0 minutes 0 times per week. Florie is doing well and work has picked up in demands, as she is now working in the office almost daily. She reports loss of 6 lbs (reports weight of 190 lb). She is not really that hungry.  We were unable to weigh the patient today for this TeleHealth visit. She feels as if she has lost 6 lbs since her last visit. She has lost 10 lbs since starting treatment with [place].  Vitamin D Deficiency Cynthia Figueroa has a diagnosis of vitamin D deficiency. She is currently taking prescription Vit D. She notes fatigue and denies nausea, vomiting or muscle weakness.  Insulin Resistance Cynthia Figueroa has a diagnosis of insulin resistance based on her elevated fasting insulin level >5. Although Cynthia Figueroa's blood glucose readings are still under good control, insulin resistance puts her at greater risk of metabolic syndrome and diabetes. She notes occasional carbohydrate cravings for Oreos. She denies GI side effects of metformin. She continues to work on diet and exercise to decrease risk of diabetes.  Depression with Anxiety Cynthia Figueroa notes her symptoms are well controlled with Zoloft. She shows no sign of suicidal or homicidal ideations.  ASSESSMENT AND PLAN:  Class 1 obesity with serious comorbidity and body mass index (BMI) of 33.0 to 33.9 in adult, unspecified obesity type  Vitamin D  deficiency - Plan: Vitamin D, Ergocalciferol, (DRISDOL) 1.25 MG (50000 UT) CAPS capsule, VITAMIN D 25 Hydroxy (Vit-D Deficiency, Fractures), Vitamin B12  Insulin resistance - Plan: metFORMIN (GLUCOPHAGE) 500 MG tablet, Comprehensive metabolic panel, Hemoglobin A1c, Insulin, random, Lipid Panel With LDL/HDL Ratio  Depression with anxiety - Plan: sertraline (ZOLOFT) 50 MG tablet  PLAN:  Vitamin D Deficiency Cynthia Figueroa was informed that low vitamin D levels contributes to fatigue and are associated with obesity, breast, and colon cancer. Cynthia Figueroa agrees to continue taking prescription Vit D 50,000 IU every week #4 and we will refill for 1 month. She will follow up for routine testing of vitamin D, at least 2-3 times per year. She was informed of the risk of over-replacement of vitamin D and agrees to not increase her dose unless she discusses this with [place] first. We will check Vit D level today. Kimani agrees to follow up with our clinic in 2 weeks.  Insulin Resistance Cynthia Figueroa will continue to work on weight loss, exercise, and decreasing simple carbohydrates in her diet to help decrease the risk of diabetes. We dicussed metformin including benefits and risks. She was informed that eating too many simple carbohydrates or too many calories at one sitting increases the likelihood of GI side effects. Loria agrees to continue taking metformin 1,000 mg PO BID #120 and we will refill for 1 month. We will check Hgb A1c, insulin, B12, and CMP today. Basilia agrees to follow up with our clinic in 2 weeks as directed to monitor her progress.  Depression with Anxiety We discussed  behavior modification techniques today to help Cynthia Figueroa deal with her emotional eating and depression. Cynthia Figueroa agrees to continue taking sertraline 50 mg 1.5 tablet PO daily #45 and we will refill for 1 month. Jaliya agrees to follow up with our clinic in 2 weeks.  Obesity Cynthia Figueroa is currently in the action stage of change. As such, her  goal is to continue with weight loss efforts She has agreed to follow the [place] eating plan Cynthia Figueroa has been instructed to work up to a goal of 150 minutes of combined cardio and strengthening exercise per week for weight loss and overall health benefits. We discussed the following Behavioral Modification Strategies today: increasing lean protein intake, increasing vegetables and work on meal planning and easy cooking plans, keeping healthy foods in the home, and planning for success   Cynthia Figueroa has agreed to follow up with our clinic in 2 weeks with Dr. Leafy Ro. She was informed of the importance of frequent follow up visits to maximize her success with intensive lifestyle modifications for her multiple health conditions.  ALLERGIES: Allergies  Allergen Reactions  . Lamisil [Terbinafine] Hives    MEDICATIONS: Current Outpatient Medications on File Prior to Visit  Medication Sig Dispense Refill  . ASHWAGANDHA PO Take 300 mg by mouth daily.    . Cranberry 500 MG CAPS Take by mouth.    . dimenhyDRINATE (DRAMAMINE) 50 MG tablet Take 1 tablet (50 mg total) by mouth every 8 (eight) hours as needed. 30 tablet 0  . fluconazole (DIFLUCAN) 150 MG tablet Take 1 tablet (150 mg total) by mouth once a week. 4 tablet 0  . IRON PO Take 1 tablet by mouth daily.    Marland Kitchen itraconazole (SPORANOX) 100 MG capsule Take 1 capsule (100 mg total) by mouth daily. 90 capsule 0  . MAGNESIUM PO Take 1 tablet by mouth daily.    . meclizine (ANTIVERT) 25 MG tablet Take 1 tablet (25 mg total) by mouth 3 (three) times daily as needed for dizziness. 30 tablet 0  . Multiple Vitamins-Minerals (MULTIVITAMIN PO) Take by mouth.    . NON FORMULARY Lemon balm herb  330 mg /mL qd prn    . Probiotic Product (PROBIOTIC DAILY PO) Take by mouth.    . progesterone (PROMETRIUM) 100 MG capsule Take 100 mg by mouth daily.    . Turmeric 500 MG CAPS Take by mouth.    Marland Kitchen VITAMIN K PO Take 150 mcg by mouth daily.     No current  facility-administered medications on file prior to visit.     PAST MEDICAL HISTORY: Past Medical History:  Diagnosis Date  . Anxiety   . GERD (gastroesophageal reflux disease)   . Infertility, female   . PCOS (polycystic ovarian syndrome)   . Pre-diabetes   . Vaginismus   . Vitamin D deficiency     PAST SURGICAL HISTORY: No past surgical history on file.  SOCIAL HISTORY: Social History   Tobacco Use  . Smoking status: Never Smoker  . Smokeless tobacco: Never Used  Substance Use Topics  . Alcohol use: No  . Drug use: No    FAMILY HISTORY: Family History  Problem Relation Age of Onset  . High blood pressure Mother   . Thyroid disease Mother   . Obesity Mother   . AAA (abdominal aortic aneurysm) Father     ROS: Review of Systems  Constitutional: Positive for malaise/fatigue and weight loss.  Gastrointestinal: Negative for nausea and vomiting.  Musculoskeletal:       Negative  muscle weakness  Psychiatric/Behavioral: Positive for depression. Negative for suicidal ideas.       + Anxiety    PHYSICAL EXAM: Pt in no acute distress  RECENT LABS AND TESTS: BMET    Component Value Date/Time   NA 141 12/23/2017 1249   K 4.2 12/23/2017 1249   CL 103 12/23/2017 1249   CO2 23 12/23/2017 1249   GLUCOSE 72 12/23/2017 1249   GLUCOSE 110 (H) 08/04/2013 2248   BUN 9 12/23/2017 1249   CREATININE 0.81 12/23/2017 1249   CALCIUM 9.2 12/23/2017 1249   GFRNONAA 98 12/23/2017 1249   GFRAA 113 12/23/2017 1249   Lab Results  Component Value Date   HGBA1C 5.6 04/21/2018   HGBA1C 5.6 12/23/2017   Lab Results  Component Value Date   INSULIN 19.9 04/21/2018   INSULIN 13.4 12/23/2017   CBC    Component Value Date/Time   WBC 9.7 12/23/2017 1249   WBC 14.2 (H) 08/04/2013 2248   RBC 4.68 12/23/2017 1249   RBC 4.62 08/04/2013 2248   HGB 12.8 12/23/2017 1249   HCT 40.5 12/23/2017 1249   PLT 427 (H) 08/04/2013 2248   MCV 87 12/23/2017 1249   MCH 27.4 12/23/2017 1249    MCH 29.2 08/04/2013 2248   MCHC 31.6 12/23/2017 1249   MCHC 33.7 08/04/2013 2248   RDW 12.5 12/23/2017 1249   LYMPHSABS 2.6 12/23/2017 1249   MONOABS 0.8 08/04/2013 2239   EOSABS 0.1 12/23/2017 1249   BASOSABS 0.0 12/23/2017 1249   Iron/TIBC/Ferritin/ %Sat No results found for: IRON, TIBC, FERRITIN, IRONPCTSAT Lipid Panel     Component Value Date/Time   CHOL 174 04/21/2018 0856   TRIG 73 04/21/2018 0856   HDL 38 (L) 04/21/2018 0856   LDLCALC 121 (H) 04/21/2018 0856   Hepatic Function Panel     Component Value Date/Time   PROT 7.0 12/23/2017 1249   ALBUMIN 3.9 12/23/2017 1249   AST 14 12/23/2017 1249   ALT 12 12/23/2017 1249   ALKPHOS 93 12/23/2017 1249   BILITOT 0.3 12/23/2017 1249   BILIDIR <0.10 06/12/2016 1309      Component Value Date/Time   TSH 1.290 12/23/2017 1249      I, Burt KnackSharon Martin, am acting as transcriptionist for Debbra RidingAlexandria Kadolph, MD  I have reviewed the above documentation for accuracy and completeness, and I agree with the above. - Debbra RidingAlexandria Kadolph, MD

## 2019-01-23 DIAGNOSIS — Z113 Encounter for screening for infections with a predominantly sexual mode of transmission: Secondary | ICD-10-CM | POA: Diagnosis not present

## 2019-01-23 DIAGNOSIS — Z3141 Encounter for fertility testing: Secondary | ICD-10-CM | POA: Diagnosis not present

## 2019-01-23 DIAGNOSIS — Z319 Encounter for procreative management, unspecified: Secondary | ICD-10-CM | POA: Diagnosis not present

## 2019-01-24 MED FILL — ESTRADIOL 0.1 MG PATCH: 0.1 | 28 days supply | Qty: 8 | Fill #0

## 2019-01-24 MED FILL — MENOPUR 75 UNIT VIAL: 75 | 15 days supply | Qty: 15 | Fill #0

## 2019-01-24 MED FILL — SHARPS COLLECTOR 1.4QT: 1 days supply | Qty: 1 | Fill #0

## 2019-01-24 MED FILL — BD NEEDLES 22GX1.5: 22G X 1-1/2 | 30 days supply | Qty: 30 | Fill #0

## 2019-01-24 MED FILL — PROGESTERONE OIL 50 MG/ML V: 50 | 30 days supply | Qty: 30 | Fill #0

## 2019-01-24 MED FILL — ESTRADIOL 2 MG TABLET: 2 | 30 days supply | Qty: 60 | Fill #0

## 2019-01-24 MED FILL — BD NEEDLES 30GX0.5: 30G X 1/2" | 20 days supply | Qty: 20 | Fill #0

## 2019-01-24 MED FILL — GONAL-F RFF REDI-JECT 900 U: 900 | 3 days supply | Qty: 5 | Fill #0

## 2019-01-24 MED FILL — METHYLPREDNISOLONE 4 MG TAB: 4 | 4 days supply | Qty: 16 | Fill #0

## 2019-01-24 MED FILL — DOXYCYCLINE HYCLATE 100 MG: 100 | 20 days supply | Qty: 40 | Fill #0

## 2019-01-24 MED FILL — BD P/P SYR 21G X 1.5: 21G X 1-1/2 | 25 days supply | Qty: 50 | Fill #0

## 2019-01-27 MED FILL — LEUPROLIDE 2WK 1 MG/0.2 ML: 1 | 14 days supply | Qty: 1 | Fill #0

## 2019-01-27 MED FILL — ULTICARE SYR 0.5 ML 31GX5/1: 31G X 5/16" | 20 days supply | Qty: 20 | Fill #0

## 2019-01-31 DIAGNOSIS — Z3183 Encounter for assisted reproductive fertility procedure cycle: Secondary | ICD-10-CM | POA: Diagnosis not present

## 2019-01-31 DIAGNOSIS — Z113 Encounter for screening for infections with a predominantly sexual mode of transmission: Secondary | ICD-10-CM | POA: Diagnosis not present

## 2019-01-31 MED FILL — NOVAREL 10,000 UNITS VIAL: 10000 | 2 days supply | Qty: 1 | Fill #0

## 2019-02-01 ENCOUNTER — Other Ambulatory Visit: Payer: Self-pay

## 2019-02-01 ENCOUNTER — Encounter (INDEPENDENT_AMBULATORY_CARE_PROVIDER_SITE_OTHER): Payer: Self-pay | Admitting: Family Medicine

## 2019-02-01 ENCOUNTER — Telehealth (INDEPENDENT_AMBULATORY_CARE_PROVIDER_SITE_OTHER): Payer: 59 | Admitting: Family Medicine

## 2019-02-01 DIAGNOSIS — E669 Obesity, unspecified: Secondary | ICD-10-CM

## 2019-02-01 DIAGNOSIS — E559 Vitamin D deficiency, unspecified: Secondary | ICD-10-CM | POA: Diagnosis not present

## 2019-02-01 DIAGNOSIS — R7303 Prediabetes: Secondary | ICD-10-CM

## 2019-02-01 DIAGNOSIS — Z6834 Body mass index (BMI) 34.0-34.9, adult: Secondary | ICD-10-CM

## 2019-02-01 DIAGNOSIS — F418 Other specified anxiety disorders: Secondary | ICD-10-CM

## 2019-02-01 MED ORDER — VITAMIN D (ERGOCALCIFEROL) 1.25 MG (50000 UNIT) PO CAPS: 50000.0000 [IU] | ORAL_CAPSULE | ORAL | 0 refills | Status: DC

## 2019-02-01 MED ORDER — SERTRALINE HCL 50 MG PO TABS: 75.0000 mg | ORAL_TABLET | Freq: Every day | ORAL | 0 refills | Status: DC

## 2019-02-01 MED ORDER — METFORMIN HCL 500 MG PO TABS: 1000.0000 mg | ORAL_TABLET | Freq: Two times a day (BID) | ORAL | 0 refills | Status: DC

## 2019-02-02 NOTE — Progress Notes (Signed)
Office: [phone]  /  Fax: [phone] TeleHealth Visit:  Cynthia Figueroa has verbally consented to this TeleHealth visit today. The patient is located at home, the provider is located at the [facility] office. The participants in this visit include the listed provider and patient. The visit was conducted today via face time.  HPI:   Chief Complaint: OBESITY Cynthia Figueroa is here to discuss her progress with her obesity treatment plan. She is on the Pescatarian eating plan and is following her eating plan approximately 100 % of the time. She states she is exercising 0 minutes 0 times per week. Cynthia Figueroa feels she is doing well maintaining her weight and thinks she may have lost 1 lb. She just started IVF and is doing well on her Pescatarian plan.  We were unable to weigh the patient today for this TeleHealth visit. She feels as if she has lost 1 lb since her last visit. She has lost 10 lbs since starting treatment with [place].  Pre-Diabetes Cynthia Figueroa has a diagnosis of pre-diabetes based on her elevated Hgb A1c and was informed this puts her at greater risk of developing diabetes. Her A1c was elevated on metformin recently. She is working on decreasing simple carbohydrates. She denies nausea or vomiting. She continues to work on diet and exercise to decrease risk of diabetes.   Vitamin D Deficiency Cynthia Figueroa has a diagnosis of vitamin D deficiency. She is stable on prescription Vit D, and last level was at goal. She denies nausea, vomiting or muscle weakness.  Depression with Emotional Eating Behaviors Cynthia Figueroa's mood is stable on Zoloft. She is sleeping well and working on decreasing emotional eating. Cynthia Figueroa is struggles with emotional eating and using food for comfort to the extent that it is negatively impacting her health. She often snacks when she is not hungry. Cynthia Figueroa sometimes feels she is out of control and then feels guilty that she made poor food choices. She has been working on  behavior modification techniques to help reduce her emotional eating and has been somewhat successful. She shows no sign of suicidal or homicidal ideations.  Depression screen [facility] 2/9 08/17/2018 06/16/2018 05/26/2018 05/09/2018 04/21/2018  Decreased Interest 0 0 0 0 1  Down, Depressed, Hopeless 0 1 1 1 1   PHQ - 2 Score 0 1 1 1 2   Altered sleeping 0 0 1 0 0  Tired, decreased energy 3 0 1 1 1   Change in appetite 0 1 1 0 2  Feeling bad or failure about yourself  1 1 0 1 0  Trouble concentrating 0 0 0 1 0  Moving slowly or fidgety/restless 0 0 0 0 0  Suicidal thoughts 0 0 0 0 0  PHQ-9 Score 4 3 4 4 5   Difficult doing work/chores - - - - -    ASSESSMENT AND PLAN:  Prediabetes - Plan: metFORMIN (GLUCOPHAGE) 500 MG tablet  Depression with anxiety - Plan: sertraline (ZOLOFT) 50 MG tablet  Vitamin D deficiency - Plan: Vitamin D, Ergocalciferol, (DRISDOL) 1.25 MG (50000 UT) CAPS capsule  Class 1 obesity with serious comorbidity and body mass index (BMI) of 34.0 to 34.9 in adult, unspecified obesity type  PLAN:  Pre-Diabetes Cynthia Figueroa will continue to work on weight loss, exercise, and decreasing simple carbohydrates in her diet to help decrease the risk of diabetes. We dicussed metformin including benefits and risks. She was informed that eating too many simple carbohydrates or too many calories at one sitting increases the likelihood of GI side effects. Cynthia Figueroa  agrees to continue taking metformin 500 mg PO BID #120 with no refills. Cynthia Figueroa agrees to follow up with our clinic in 3 weeks as directed to monitor her progress.  Vitamin D Deficiency Cynthia Figueroa was informed that low vitamin D levels contributes to fatigue and are associated with obesity, breast, and colon cancer. Cynthia Figueroa agrees to continue taking prescription Vit D 50,000 IU every week #4 and we will refill for 1 month. She will follow up for routine testing of vitamin D, at least 2-3 times per year. She was informed of the risk of  over-replacement of vitamin D and agrees to not increase her dose unless she discusses this with us first. Cynthia Figueroa agrees to follow up with our clinic in 3 weeks.  Depression with Emotional Eating Behaviors We discussed behavior modification techniques today to help Cynthia Figueroa deal with her emotional eating and depression. Cynthia Figueroa agrees to continue taking Zoloft 50 mg 1.5 tablet PO q daily #45 and we will refill for 1 month. Cynthia Figueroa agrees to follow up with our clinic in 3 weeks.  Obesity Cynthia Figueroa is currently in the action stage of change. As such, her goal is to continue with weight loss efforts She has agreed to follow the Pescatarian eating plan Cynthia Figueroa has been instructed to work up to a goal of 150 minutes of combined cardio and strengthening exercise per week for weight loss and overall health benefits. We discussed the following Behavioral Modification Strategies today: work on meal planning and easy cooking plans and no skipping meals   Cynthia Figueroa has agreed to follow up with our clinic in 3 weeks. She was informed of the importance of frequent follow up visits to maximize her success with intensive lifestyle modifications for her multiple health conditions.  ALLERGIES: Allergies  Allergen Reactions   Lamisil [Terbinafine] Hives    MEDICATIONS: Current Outpatient Medications on File Prior to Visit  Medication Sig Dispense Refill   ASHWAGANDHA PO Take 300 mg by mouth daily.     Cranberry 500 MG CAPS Take by mouth.     dimenhyDRINATE (DRAMAMINE) 50 MG tablet Take 1 tablet (50 mg total) by mouth every 8 (eight) hours as needed. 30 tablet 0   fluconazole (DIFLUCAN) 150 MG tablet Take 1 tablet (150 mg total) by mouth once a week. 4 tablet 0   IRON PO Take 1 tablet by mouth daily.     itraconazole (SPORANOX) 100 MG capsule Take 1 capsule (100 mg total) by mouth daily. 90 capsule 0   MAGNESIUM PO Take 1 tablet by mouth daily.     meclizine (ANTIVERT) 25 MG tablet Take 1 tablet (25  mg total) by mouth 3 (three) times daily as needed for dizziness. 30 tablet 0   Multiple Vitamins-Minerals (MULTIVITAMIN PO) Take by mouth.     NON FORMULARY Lemon balm herb  330 mg /mL qd prn     Probiotic Product (PROBIOTIC DAILY PO) Take by mouth.     progesterone (PROMETRIUM) 100 MG capsule Take 100 mg by mouth daily.     Turmeric 500 MG CAPS Take by mouth.     VITAMIN K PO Take 150 mcg by mouth daily.     No current facility-administered medications on file prior to visit.     PAST MEDICAL HISTORY: Past Medical History:  Diagnosis Date   Anxiety    GERD (gastroesophageal reflux disease)    Infertility, female    PCOS (polycystic ovarian syndrome)    Pre-diabetes    Vaginismus    Vitamin D  deficiency     PAST SURGICAL HISTORY: History reviewed. No pertinent surgical history.  SOCIAL HISTORY: Social History   Tobacco Use   Smoking status: Never Smoker   Smokeless tobacco: Never Used  Substance Use Topics   Alcohol use: No   Drug use: No    FAMILY HISTORY: Family History  Problem Relation Age of Onset   High blood pressure Mother    Thyroid disease Mother    Obesity Mother    AAA (abdominal aortic aneurysm) Father     ROS: Review of Systems  Constitutional: Positive for weight loss.  Gastrointestinal: Negative for nausea and vomiting.  Musculoskeletal:       Negative muscle weakness  Psychiatric/Behavioral: Positive for depression. Negative for suicidal ideas.    PHYSICAL EXAM: Pt in no acute distress  RECENT LABS AND TESTS: BMET    Component Value Date/Time   NA 144 01/18/2019 1214   K 4.1 01/18/2019 1214   CL 105 01/18/2019 1214   CO2 25 01/18/2019 1214   GLUCOSE 82 01/18/2019 1214   GLUCOSE 110 (H) 08/04/2013 2248   BUN 10 01/18/2019 1214   CREATININE 0.96 01/18/2019 1214   CALCIUM 9.7 01/18/2019 1214   GFRNONAA 79 01/18/2019 1214   GFRAA 91 01/18/2019 1214   Lab Results  Component Value Date   HGBA1C 5.8 (H)  01/18/2019   HGBA1C 5.6 04/21/2018   HGBA1C 5.6 12/23/2017   Lab Results  Component Value Date   INSULIN 15.5 01/18/2019   INSULIN 19.9 04/21/2018   INSULIN 13.4 12/23/2017   CBC    Component Value Date/Time   WBC 9.7 12/23/2017 1249   WBC 14.2 (H) 08/04/2013 2248   RBC 4.68 12/23/2017 1249   RBC 4.62 08/04/2013 2248   HGB 12.8 12/23/2017 1249   HCT 40.5 12/23/2017 1249   PLT 427 (H) 08/04/2013 2248   MCV 87 12/23/2017 1249   MCH 27.4 12/23/2017 1249   MCH 29.2 08/04/2013 2248   MCHC 31.6 12/23/2017 1249   MCHC 33.7 08/04/2013 2248   RDW 12.5 12/23/2017 1249   LYMPHSABS 2.6 12/23/2017 1249   MONOABS 0.8 08/04/2013 2239   EOSABS 0.1 12/23/2017 1249   BASOSABS 0.0 12/23/2017 1249   Iron/TIBC/Ferritin/ %Sat No results found for: IRON, TIBC, FERRITIN, IRONPCTSAT Lipid Panel     Component Value Date/Time   CHOL 157 01/18/2019 1214   TRIG 124 01/18/2019 1214   HDL 30 (L) 01/18/2019 1214   LDLCALC 104 (H) 01/18/2019 1214   Hepatic Function Panel     Component Value Date/Time   PROT 7.1 01/18/2019 1214   ALBUMIN 4.4 01/18/2019 1214   AST 16 01/18/2019 1214   ALT 13 01/18/2019 1214   ALKPHOS 98 01/18/2019 1214   BILITOT 0.2 01/18/2019 1214   BILIDIR <0.10 06/12/2016 1309      Component Value Date/Time   TSH 1.290 12/23/2017 1249      I, Burt Knack, am acting as transcriptionist for Quillian Quince, MD I have reviewed the above documentation for accuracy and completeness, and I agree with the above. -Quillian Quince, MD

## 2019-02-09 DIAGNOSIS — F430 Acute stress reaction: Secondary | ICD-10-CM | POA: Diagnosis not present

## 2019-02-13 DIAGNOSIS — Z113 Encounter for screening for infections with a predominantly sexual mode of transmission: Secondary | ICD-10-CM | POA: Diagnosis not present

## 2019-02-13 DIAGNOSIS — E282 Polycystic ovarian syndrome: Secondary | ICD-10-CM | POA: Diagnosis not present

## 2019-02-13 DIAGNOSIS — N970 Female infertility associated with anovulation: Secondary | ICD-10-CM | POA: Diagnosis not present

## 2019-02-13 DIAGNOSIS — F430 Acute stress reaction: Secondary | ICD-10-CM | POA: Diagnosis not present

## 2019-02-13 DIAGNOSIS — Z3183 Encounter for assisted reproductive fertility procedure cycle: Secondary | ICD-10-CM | POA: Diagnosis not present

## 2019-02-16 DIAGNOSIS — E282 Polycystic ovarian syndrome: Secondary | ICD-10-CM | POA: Diagnosis not present

## 2019-02-16 DIAGNOSIS — Z113 Encounter for screening for infections with a predominantly sexual mode of transmission: Secondary | ICD-10-CM | POA: Diagnosis not present

## 2019-02-16 DIAGNOSIS — Z3183 Encounter for assisted reproductive fertility procedure cycle: Secondary | ICD-10-CM | POA: Diagnosis not present

## 2019-02-16 DIAGNOSIS — N970 Female infertility associated with anovulation: Secondary | ICD-10-CM | POA: Diagnosis not present

## 2019-02-18 DIAGNOSIS — N970 Female infertility associated with anovulation: Secondary | ICD-10-CM | POA: Diagnosis not present

## 2019-02-18 DIAGNOSIS — E282 Polycystic ovarian syndrome: Secondary | ICD-10-CM | POA: Diagnosis not present

## 2019-02-18 DIAGNOSIS — Z113 Encounter for screening for infections with a predominantly sexual mode of transmission: Secondary | ICD-10-CM | POA: Diagnosis not present

## 2019-02-18 DIAGNOSIS — Z3183 Encounter for assisted reproductive fertility procedure cycle: Secondary | ICD-10-CM | POA: Diagnosis not present

## 2019-02-20 MED FILL — SERTRALINE HCL 50 MG TABLET: 50 | 30 days supply | Qty: 45 | Fill #0

## 2019-02-20 MED FILL — VIT D2 1.25 MG (50,000 UNIT: 1.25 MG | 28 days supply | Qty: 4 | Fill #0

## 2019-02-21 DIAGNOSIS — E282 Polycystic ovarian syndrome: Secondary | ICD-10-CM | POA: Diagnosis not present

## 2019-02-21 DIAGNOSIS — Z113 Encounter for screening for infections with a predominantly sexual mode of transmission: Secondary | ICD-10-CM | POA: Diagnosis not present

## 2019-02-21 DIAGNOSIS — Z3183 Encounter for assisted reproductive fertility procedure cycle: Secondary | ICD-10-CM | POA: Diagnosis not present

## 2019-02-21 DIAGNOSIS — N970 Female infertility associated with anovulation: Secondary | ICD-10-CM | POA: Diagnosis not present

## 2019-02-22 DIAGNOSIS — N970 Female infertility associated with anovulation: Secondary | ICD-10-CM | POA: Diagnosis not present

## 2019-02-22 DIAGNOSIS — E282 Polycystic ovarian syndrome: Secondary | ICD-10-CM | POA: Diagnosis not present

## 2019-02-22 DIAGNOSIS — Z3183 Encounter for assisted reproductive fertility procedure cycle: Secondary | ICD-10-CM | POA: Diagnosis not present

## 2019-02-22 MED FILL — CABERGOLINE 0.5 MG TABS: 0.5 | 8 days supply | Qty: 8 | Fill #0

## 2019-02-23 MED FILL — DIAZEPAM 10 MG TABS: 10 | 2 days supply | Qty: 2 | Fill #0

## 2019-02-24 DIAGNOSIS — Z3183 Encounter for assisted reproductive fertility procedure cycle: Secondary | ICD-10-CM | POA: Diagnosis not present

## 2019-02-24 DIAGNOSIS — E288 Other ovarian dysfunction: Secondary | ICD-10-CM | POA: Diagnosis not present

## 2019-02-24 DIAGNOSIS — N856 Intrauterine synechiae: Secondary | ICD-10-CM | POA: Diagnosis not present

## 2019-02-24 DIAGNOSIS — Z3141 Encounter for fertility testing: Secondary | ICD-10-CM | POA: Diagnosis not present

## 2019-02-28 DIAGNOSIS — F430 Acute stress reaction: Secondary | ICD-10-CM | POA: Diagnosis not present

## 2019-03-01 DIAGNOSIS — Z3183 Encounter for assisted reproductive fertility procedure cycle: Secondary | ICD-10-CM | POA: Diagnosis not present

## 2019-03-02 DIAGNOSIS — Z3183 Encounter for assisted reproductive fertility procedure cycle: Secondary | ICD-10-CM | POA: Diagnosis not present

## 2019-03-08 DIAGNOSIS — E288 Other ovarian dysfunction: Secondary | ICD-10-CM | POA: Diagnosis not present

## 2019-03-08 DIAGNOSIS — Z319 Encounter for procreative management, unspecified: Secondary | ICD-10-CM | POA: Diagnosis not present

## 2019-03-20 DIAGNOSIS — N970 Female infertility associated with anovulation: Secondary | ICD-10-CM | POA: Diagnosis not present

## 2019-03-20 DIAGNOSIS — Z113 Encounter for screening for infections with a predominantly sexual mode of transmission: Secondary | ICD-10-CM | POA: Diagnosis not present

## 2019-03-20 DIAGNOSIS — Z3183 Encounter for assisted reproductive fertility procedure cycle: Secondary | ICD-10-CM | POA: Diagnosis not present

## 2019-03-20 DIAGNOSIS — E282 Polycystic ovarian syndrome: Secondary | ICD-10-CM | POA: Diagnosis not present

## 2019-03-21 MED FILL — DIAZEPAM 10 MG TABS: 10 | 1 days supply | Qty: 1 | Fill #0

## 2019-03-21 MED FILL — traMADol HCL 50 MG TABS: 50 | 1 days supply | Qty: 1 | Fill #0

## 2019-03-22 MED FILL — ESTRADIOL 0.1 MG PATCH: 0.1 | 28 days supply | Qty: 8 | Fill #1

## 2019-03-27 DIAGNOSIS — Z3183 Encounter for assisted reproductive fertility procedure cycle: Secondary | ICD-10-CM | POA: Diagnosis not present

## 2019-04-04 DIAGNOSIS — Z3201 Encounter for pregnancy test, result positive: Secondary | ICD-10-CM | POA: Diagnosis not present

## 2019-04-04 DIAGNOSIS — Z3200 Encounter for pregnancy test, result unknown: Secondary | ICD-10-CM | POA: Diagnosis not present

## 2019-04-06 DIAGNOSIS — Z3200 Encounter for pregnancy test, result unknown: Secondary | ICD-10-CM | POA: Diagnosis not present

## 2019-04-06 DIAGNOSIS — Z3201 Encounter for pregnancy test, result positive: Secondary | ICD-10-CM | POA: Diagnosis not present

## 2019-04-06 MED FILL — PROGESTERONE OIL 50 MG/ML V: 50 | 30 days supply | Qty: 30 | Fill #1

## 2019-04-06 MED FILL — ESTRADIOL 2 MG TABS: 2 | 30 days supply | Qty: 60 | Fill #1

## 2019-04-17 ENCOUNTER — Ambulatory Visit (INDEPENDENT_AMBULATORY_CARE_PROVIDER_SITE_OTHER): Payer: 59 | Admitting: Physician Assistant

## 2019-04-17 ENCOUNTER — Encounter (INDEPENDENT_AMBULATORY_CARE_PROVIDER_SITE_OTHER): Payer: Self-pay | Admitting: Physician Assistant

## 2019-04-17 ENCOUNTER — Other Ambulatory Visit: Payer: Self-pay

## 2019-04-17 DIAGNOSIS — F3289 Other specified depressive episodes: Secondary | ICD-10-CM | POA: Diagnosis not present

## 2019-04-17 DIAGNOSIS — Z6833 Body mass index (BMI) 33.0-33.9, adult: Secondary | ICD-10-CM

## 2019-04-17 DIAGNOSIS — Z3200 Encounter for pregnancy test, result unknown: Secondary | ICD-10-CM | POA: Diagnosis not present

## 2019-04-17 DIAGNOSIS — E669 Obesity, unspecified: Secondary | ICD-10-CM

## 2019-04-17 DIAGNOSIS — E559 Vitamin D deficiency, unspecified: Secondary | ICD-10-CM | POA: Diagnosis not present

## 2019-04-17 DIAGNOSIS — F430 Acute stress reaction: Secondary | ICD-10-CM | POA: Diagnosis not present

## 2019-04-17 MED ORDER — SERTRALINE HCL 50 MG PO TABS: 75.0000 mg | ORAL_TABLET | Freq: Every day | ORAL | 0 refills | Status: DC

## 2019-04-17 MED FILL — BD P/P SYR 21G X 1.5: 21G X 1-1/2 | 25 days supply | Qty: 50 | Fill #1

## 2019-04-17 MED FILL — SERTRALINE HCL 50 MG TABLET: 50 | 30 days supply | Qty: 45 | Fill #0

## 2019-04-17 MED FILL — BD P/P SYR 21G X 1.5": 21G X 1-1/2 | 25 days supply | Qty: 50 | Fill #1

## 2019-04-17 MED FILL — DOXYLAMINE-PYRIDOXINE 10-10: 10-10 | 30 days supply | Qty: 60 | Fill #0

## 2019-04-17 MED FILL — ESTRADIOL 0.1 MG PATCH: 0.1 | 28 days supply | Qty: 8 | Fill #2

## 2019-04-18 MED FILL — BD NEEDLES 21GX1.5: 21G X 1-1/2 | 30 days supply | Qty: 30 | Fill #0

## 2019-04-18 MED FILL — BD NEEDLES 21GX1.5": 21G X 1-1/2 | 30 days supply | Qty: 30 | Fill #0

## 2019-04-18 NOTE — Progress Notes (Signed)
Office: [phone]  /  Fax: [phone] TeleHealth Visit:  Cynthia Figueroa has verbally consented to this TeleHealth visit today. The patient is located at home, the provider is located at the [employer] [facility] office. The participants in this visit include the listed provider and patient and any and all parties involved. The visit was conducted today via FaceTime.  HPI:  Chief Complaint: OBESITY Cynthia Figueroa is here to discuss her progress with her obesity treatment plan. She is on the [employer] and states she is following her eating plan approximately 50 % of the time. She states she is exercising 0 minutes 0 times per week. Karly's most recent weight is 190 pounds (04/17/19). She reports that she is [redacted] weeks pregnant via IVF. She has not yet seen an obstetrician, but she will be setting up an appointment. She notes a lot of nausea and she is unable to eat on the plan. She is not drinking enough fluid, secondary to nausea.  Vitamin D deficiency Cynthia Figueroa has a diagnosis of vitamin D deficiency. She is on prescription vitamin D. Cynthia Figueroa has no nausea, vomiting or muscle weakness from vitamin D. Her last level was at goal. Cynthia Figueroa is at risk for over-supplementation.  Other Depression with emotional eating Cynthia Figueroa struggles with emotional eating. She has no suicidal or homicidal ideations. She notes feeling bad when she misses a dose of Zoloft.  ASSESSMENT AND PLAN:  Vitamin D deficiency  Other depression, emotional eating - Plan: sertraline (ZOLOFT) 50 MG tablet  Class 1 obesity with serious comorbidity and body mass index (BMI) of 33.0 to 33.9 in adult, unspecified obesity type  PLAN:  Vitamin D deficiency Low Vitamin D level contributes to fatigue and are associated with obesity, breast, and colon cancer. Artavia agrees to hold vitamin D until she sees her OB. She will follow-up for routine testing of vitamin D, at least 2-3 times per year to avoid over-replacement.  Other  Depression with emotional eating Cynthia Figueroa agrees to continue Zoloft 50 mg 1 1/2 tablets daily #45 with no refills and follow up with our clinic in 2 weeks.  Obesity Cynthia Figueroa is currently in the action stage of change. As such, her goal is to continue with weight loss efforts. She has agreed to [employer]. Cynthia Figueroa has been instructed to work up to a goal of 150 minutes of combined cardio and strengthening exercise per week for weight loss and overall health benefits. We discussed the following Behavioral Modification Strategies today: increasing lean protein intake, increasing water intake and no skipping meals.  Cynthia Figueroa has agreed to follow-up with our clinic in 2 weeks. She was informed of the importance of frequent follow-up visits to maximize her success with intensive lifestyle modifications for her multiple health conditions.  ALLERGIES: Allergies  Allergen Reactions   Lamisil [Terbinafine] Hives    MEDICATIONS: Current Outpatient Medications on File Prior to Visit  Medication Sig Dispense Refill   Multiple Vitamins-Minerals (MULTIVITAMIN PO) Take by mouth.     Vitamin D, Ergocalciferol, (DRISDOL) 1.25 MG (50000 UT) CAPS capsule Take 1 capsule (50,000 Units total) by mouth every 7 (seven) days. 4 capsule 0   No current facility-administered medications on file prior to visit.    PAST MEDICAL HISTORY: Past Medical History:  Diagnosis Date   Anxiety    GERD (gastroesophageal reflux disease)    Infertility, female    PCOS (polycystic ovarian syndrome)    Pre-diabetes    Vaginismus    Vitamin D deficiency  PAST SURGICAL HISTORY: History reviewed. No pertinent surgical history.  SOCIAL HISTORY: Social History   Tobacco Use   Smoking status: Never Smoker   Smokeless tobacco: Never Used  Substance Use Topics   Alcohol use: No   Drug use: No    FAMILY HISTORY: Family History  Problem Relation Age of Onset   High blood pressure Mother     Thyroid disease Mother    Obesity Mother    AAA (abdominal aortic aneurysm) Father     ROS: Review of Systems  Constitutional: Positive for weight loss.  Gastrointestinal: Positive for nausea. Negative for vomiting.  Musculoskeletal:       Negative for muscle weakness  Psychiatric/Behavioral: Positive for depression. Negative for suicidal ideas.    PHYSICAL EXAM: There were no vitals taken for this visit. There is no height or weight on file to calculate BMI. Physical Exam Vitals reviewed.  Constitutional:      General: She is not in acute distress.    Appearance: Normal appearance. She is well-developed. She is obese.  Pulmonary:     Effort: Pulmonary effort is normal.  Neurological:     Mental Status: She is alert and oriented to person, place, and time.  Psychiatric:        Mood and Affect: Mood normal.        Behavior: Behavior normal.        Thought Content: Thought content does not include homicidal or suicidal ideation.     RECENT LABS AND TESTS: BMET    Component Value Date/Time   NA 144 01/18/2019 1214   K 4.1 01/18/2019 1214   CL 105 01/18/2019 1214   CO2 25 01/18/2019 1214   GLUCOSE 82 01/18/2019 1214   GLUCOSE 110 (H) 08/04/2013 2248   BUN 10 01/18/2019 1214   CREATININE 0.96 01/18/2019 1214   CALCIUM 9.7 01/18/2019 1214   GFRNONAA 79 01/18/2019 1214   GFRAA 91 01/18/2019 1214   Lab Results  Component Value Date   HGBA1C 5.8 (H) 01/18/2019   HGBA1C 5.6 04/21/2018   HGBA1C 5.6 12/23/2017   Lab Results  Component Value Date   INSULIN 15.5 01/18/2019   INSULIN 19.9 04/21/2018   INSULIN 13.4 12/23/2017   CBC    Component Value Date/Time   WBC 9.7 12/23/2017 1249   WBC 14.2 (H) 08/04/2013 2248   RBC 4.68 12/23/2017 1249   RBC 4.62 08/04/2013 2248   HGB 12.8 12/23/2017 1249   HCT 40.5 12/23/2017 1249   PLT 427 (H) 08/04/2013 2248   MCV 87 12/23/2017 1249   MCH 27.4 12/23/2017 1249   MCH 29.2 08/04/2013 2248   MCHC 31.6 12/23/2017 1249     MCHC 33.7 08/04/2013 2248   RDW 12.5 12/23/2017 1249   LYMPHSABS 2.6 12/23/2017 1249   MONOABS 0.8 08/04/2013 2239   EOSABS 0.1 12/23/2017 1249   BASOSABS 0.0 12/23/2017 1249   Iron/TIBC/Ferritin/ %Sat No results found for: IRON, TIBC, FERRITIN, IRONPCTSAT Lipid Panel     Component Value Date/Time   CHOL 157 01/18/2019 1214   TRIG 124 01/18/2019 1214   HDL 30 (L) 01/18/2019 1214   LDLCALC 104 (H) 01/18/2019 1214   Hepatic Function Panel     Component Value Date/Time   PROT 7.1 01/18/2019 1214   ALBUMIN 4.4 01/18/2019 1214   AST 16 01/18/2019 1214   ALT 13 01/18/2019 1214   ALKPHOS 98 01/18/2019 1214   BILITOT 0.2 01/18/2019 1214   BILIDIR <0.10 06/12/2016 1309  Component Value Date/Time   TSH 1.290 12/23/2017 1249     Ref. Range 01/18/2019 12:14  Vitamin D, 25-Hydroxy Latest Ref Range: 30.0 - 100.0 ng/mL 57.1    I, [place] Crane, am acting as Energy manager for [employer], PA-C

## 2019-04-27 DIAGNOSIS — F430 Acute stress reaction: Secondary | ICD-10-CM | POA: Diagnosis not present

## 2019-04-29 DIAGNOSIS — R05 Cough: Secondary | ICD-10-CM | POA: Diagnosis not present

## 2019-05-01 ENCOUNTER — Telehealth (INDEPENDENT_AMBULATORY_CARE_PROVIDER_SITE_OTHER): Payer: 59 | Admitting: Physician Assistant

## 2019-05-01 ENCOUNTER — Encounter (INDEPENDENT_AMBULATORY_CARE_PROVIDER_SITE_OTHER): Payer: Self-pay | Admitting: Physician Assistant

## 2019-05-01 ENCOUNTER — Other Ambulatory Visit: Payer: Self-pay

## 2019-05-01 DIAGNOSIS — F3289 Other specified depressive episodes: Secondary | ICD-10-CM

## 2019-05-01 DIAGNOSIS — E7849 Other hyperlipidemia: Secondary | ICD-10-CM

## 2019-05-01 DIAGNOSIS — E669 Obesity, unspecified: Secondary | ICD-10-CM | POA: Diagnosis not present

## 2019-05-01 DIAGNOSIS — Z6833 Body mass index (BMI) 33.0-33.9, adult: Secondary | ICD-10-CM | POA: Diagnosis not present

## 2019-05-01 MED ORDER — SERTRALINE HCL 50 MG PO TABS: 75.0000 mg | ORAL_TABLET | Freq: Every day | ORAL | 0 refills | Status: DC

## 2019-05-01 NOTE — Progress Notes (Signed)
TeleHealth Visit:  Due to the COVID-19 pandemic, this visit was completed with telemedicine (audio/video) technology to reduce patient and provider exposure as well as to preserve personal protective equipment.   Cynthia Figueroa has verbally consented to this TeleHealth visit. The patient is located at home, the provider is located at the [employer] and [facility] office. The participants in this visit include the listed provider and patient and any and all parties involved. The visit was conducted today via FaceTime.  Chief Complaint: OBESITY Cynthia Figueroa is here to discuss her progress with her obesity treatment plan along with follow-up of her obesity related diagnoses. Kampbell is on the [employer] and states she is following her eating plan approximately 50% of the time. Nadezhda states she is exercising 0 minutes 0 times per week.  Today's visit was #: 25 Starting weight: 183 lbs Starting date: 12/23/2017  Interim History: [name]'s most recent weight is 190 pounds (05/01/19). Her husband tested positive for COVID recently. She is now having body aches and chills, headache and cough. She states that she will be tested for COVID in a few days. She has already had one negative test. She is [redacted] weeks pregnant. [name] has only been able to eat soups and broths. She will see her OB in a few weeks.  Subjective:   Other hyperlipidemia Cynthia Figueroa has hyperlipidemia and her last DL was 30 and LDL was 104. She is not on medications. Bret has been trying to improve her cholesterol levels with intensive lifestyle modification including a low saturated fat diet and weight loss. She is not exercising. She denies any chest pain or myalgias.  Lab Results  Component Value Date   ALT 13 01/18/2019   AST 16 01/18/2019   ALKPHOS 98 01/18/2019   BILITOT 0.2 01/18/2019   Lab Results  Component Value Date   CHOL 157 01/18/2019   HDL 30 (L) 01/18/2019   LDLCALC 104 (H) 01/18/2019   TRIG 124 01/18/2019    Other depression, emotional eating  Cynthia Figueroa struggles with emotional eating and using food for comfort to the extent that it is negatively impacting her health. She is on Zoloft. She shows no sign of suicidal or homicidal ideations. Cynthia Figueroa has no emotional eating currently. She has been working on behavior modification techniques to help reduce her emotional eating and has been somewhat successful.   Assessment/Plan:   Other hyperlipidemia Cardiovascular risk and specific lipid/LDL goals reviewed.  We discussed several lifestyle modifications today and [name] will continue with the plan. Orders and follow up as documented in patient record.   Counseling Intensive lifestyle modifications are the first line treatment for this issue. . Dietary changes: Increase soluble fiber. Decrease simple carbohydrates. . Exercise changes: Moderate to vigorous-intensity aerobic activity 150 minutes per week if tolerated. . Lipid-lowering medications: see documented in medical record.  Other depression, emotional eating Behavior modification techniques were discussed today to help Cynthia Figueroa deal with her emotional/non-hunger eating behaviors. Cynthia Figueroa agrees to continue Zoloft 50 mg daily #30 with no refills. Orders and follow up as documented in patient record.   Obesity Cynthia Figueroa is currently in the action stage of change. As such, her goal is to continue with weight loss efforts. She has agreed to the [employer].   Exercise goals: For substantial health benefits, adults should do at least 150 minutes (2 hours and 30 minutes) a week of moderate-intensity, or 75 minutes (1 hour and 15 minutes) a week of vigorous-intensity aerobic physical activity, or an equivalent combination  of moderate- and vigorous-intensity aerobic activity. Aerobic activity should be performed in episodes of at least 10 minutes, and preferably, it should be spread throughout the week. Adults should also include muscle-strengthening  activities that involve all major muscle groups on 2 or more days a week.  Behavioral modification strategies: meal planning and cooking strategies and keeping healthy foods in the home.  Cynthia Figueroa has agreed to follow-up with our clinic in 2 weeks. She was informed of the importance of frequent follow-up visits to maximize her success with intensive lifestyle modifications for her multiple health conditions.  Objective:   VITALS: Per patient if applicable, see vitals. GENERAL: Alert and in no acute distress. CARDIOPULMONARY: No increased WOB. Speaking in clear sentences.  PSYCH: Pleasant and cooperative. Speech normal rate and rhythm. Affect is appropriate. Insight and judgement are appropriate. Attention is focused, linear, and appropriate.  NEURO: Oriented as arrived to appointment on time with no prompting.   Lab Results  Component Value Date   CREATININE 0.96 01/18/2019   BUN 10 01/18/2019   NA 144 01/18/2019   K 4.1 01/18/2019   CL 105 01/18/2019   CO2 25 01/18/2019   Lab Results  Component Value Date   ALT 13 01/18/2019   AST 16 01/18/2019   ALKPHOS 98 01/18/2019   BILITOT 0.2 01/18/2019   Lab Results  Component Value Date   HGBA1C 5.8 (H) 01/18/2019   HGBA1C 5.6 04/21/2018   HGBA1C 5.6 12/23/2017   Lab Results  Component Value Date   INSULIN 15.5 01/18/2019   INSULIN 19.9 04/21/2018   INSULIN 13.4 12/23/2017   Lab Results  Component Value Date   TSH 1.290 12/23/2017   Lab Results  Component Value Date   CHOL 157 01/18/2019   HDL 30 (L) 01/18/2019   LDLCALC 104 (H) 01/18/2019   TRIG 124 01/18/2019   Lab Results  Component Value Date   WBC 9.7 12/23/2017   HGB 12.8 12/23/2017   HCT 40.5 12/23/2017   MCV 87 12/23/2017   PLT 427 (H) 08/04/2013   No results found for: IRON, TIBC, FERRITIN   Ref. Range 01/18/2019 12:14  Vitamin D, 25-Hydroxy Latest Ref Range: 30.0 - 100.0 ng/mL 57.1   Attestation Statements:   Reviewed by clinician on day of visit:  allergies, medications, problem list, medical history, surgical history, family history, social history, and previous encounter notes.  Cristi Loron, am acting as Energy manager for [employer], PA-C.  I have reviewed the above documentation for accuracy and completeness, and I agree with the above. Alois Cliche, PA-C

## 2019-05-02 DIAGNOSIS — O0900 Supervision of pregnancy with history of infertility, unspecified trimester: Secondary | ICD-10-CM | POA: Diagnosis not present

## 2019-05-02 MED FILL — PROGESTERONE MICRONIZED 200: 200 | 14 days supply | Qty: 14 | Fill #0

## 2019-05-15 ENCOUNTER — Telehealth (INDEPENDENT_AMBULATORY_CARE_PROVIDER_SITE_OTHER): Payer: 59 | Admitting: Physician Assistant

## 2019-05-15 MED FILL — PROGESTERONE MICRONIZED 200: 200 | 14 days supply | Qty: 14 | Fill #0

## 2019-05-17 MED FILL — DOXYLAMINE-PYRIDOXINE 10-10: 10-10 | 30 days supply | Qty: 60 | Fill #1

## 2019-05-26 DIAGNOSIS — N925 Other specified irregular menstruation: Secondary | ICD-10-CM | POA: Diagnosis not present

## 2019-05-26 DIAGNOSIS — Z3A11 11 weeks gestation of pregnancy: Secondary | ICD-10-CM | POA: Diagnosis not present

## 2019-05-26 DIAGNOSIS — O3680X9 Pregnancy with inconclusive fetal viability, other fetus: Secondary | ICD-10-CM | POA: Diagnosis not present

## 2019-05-26 DIAGNOSIS — N942 Vaginismus: Secondary | ICD-10-CM | POA: Diagnosis not present

## 2019-05-26 LAB — OB RESULTS CONSOLE HEPATITIS B SURFACE ANTIGEN: Hepatitis B Surface Ag: NEGATIVE

## 2019-05-26 LAB — OB RESULTS CONSOLE HIV ANTIBODY (ROUTINE TESTING): HIV: NONREACTIVE

## 2019-06-12 ENCOUNTER — Telehealth (INDEPENDENT_AMBULATORY_CARE_PROVIDER_SITE_OTHER): Payer: 59 | Admitting: Family Medicine

## 2019-06-12 ENCOUNTER — Other Ambulatory Visit: Payer: Self-pay

## 2019-06-12 ENCOUNTER — Encounter (INDEPENDENT_AMBULATORY_CARE_PROVIDER_SITE_OTHER): Payer: Self-pay | Admitting: Family Medicine

## 2019-06-12 DIAGNOSIS — Z6833 Body mass index (BMI) 33.0-33.9, adult: Secondary | ICD-10-CM

## 2019-06-12 DIAGNOSIS — F3289 Other specified depressive episodes: Secondary | ICD-10-CM

## 2019-06-12 DIAGNOSIS — E669 Obesity, unspecified: Secondary | ICD-10-CM | POA: Diagnosis not present

## 2019-06-12 DIAGNOSIS — R7303 Prediabetes: Secondary | ICD-10-CM

## 2019-06-12 MED ORDER — SERTRALINE HCL 50 MG PO TABS: 75.0000 mg | ORAL_TABLET | Freq: Every day | ORAL | 0 refills | Status: DC

## 2019-06-12 MED FILL — SERTRALINE HCL 50 MG TABLET: 50 | 30 days supply | Qty: 45 | Fill #0

## 2019-06-12 NOTE — Progress Notes (Signed)
TeleHealth Visit:  Due to the COVID-19 pandemic, this visit was completed with telemedicine (audio/video) technology to reduce patient and provider exposure as well as to preserve personal protective equipment.   Cynthia Figueroa has verbally consented to this TeleHealth visit. The patient is located at home, the provider is located at the [employer] [facility] office. The participants in this visit include the listed provider and patient. The visit was conducted today via Face Time.  Chief Complaint: OBESITY Cynthia Figueroa is here to discuss her progress with her obesity treatment plan along with follow-up of her obesity related diagnoses. Cynthia Figueroa is on the [employer] and states she is following her eating plan approximately 50% of the time. Cynthia Figueroa states she is exercising for 0 minutes 0 times per week.  Today's visit was #: 26 Starting weight: 183 lbs Starting date: 12/23/2017  Interim History: Cynthia Figueroa is still recovering from COVID.  She feels COVID significantly decreased her appetite and patient feels she cannot eat much.  Not able to eat as much as she is used to.  No nausea, just feeling full.  Patient did not report a weight today.  Subjective:   1. Prediabetes Cynthia Figueroa has a diagnosis of prediabetes based on her elevated HgA1c and was informed this puts her at greater risk of developing diabetes. She continues to work on diet and exercise to decrease her risk of diabetes. She denies nausea or hypoglycemia.  She was previously taking metformin.  Lab Results  Component Value Date   HGBA1C 5.8 (H) 01/18/2019   Lab Results  Component Value Date   INSULIN 15.5 01/18/2019   INSULIN 19.9 04/21/2018   INSULIN 13.4 12/23/2017   2. Other depression, emotional eating Cynthia Figueroa is struggling with emotional eating and using food for comfort to the extent that it is negatively impacting her health. She has been working on behavior modification techniques to help reduce her emotional eating  and has been unsuccessful. She shows no sign of suicidal or homicidal ideations.  She ran out of Zoloft.  Assessment/Plan:   1. Prediabetes Cynthia Figueroa will continue to work on weight loss, exercise, and decreasing simple carbohydrates to help decrease the risk of diabetes.  The patient is to follow up with OB for GDM screening at around 24-28 weeks.  2. Other depression, emotional eating Behavior modification techniques were discussed today to help Cynthia Figueroa deal with her emotional/non-hunger eating behaviors.  Orders and follow up as documented in patient record.  - sertraline (ZOLOFT) 50 MG tablet; Take 1.5 tablets (75 mg total) by mouth daily.  Dispense: 45 tablet; Refill: 0  3. Class 1 obesity with serious comorbidity and body mass index (BMI) of 33.0 to 33.9 in adult, unspecified obesity type Cynthia Figueroa is currently in the action stage of change. As such, her goal is to continue with weight loss efforts. She has agreed to keeping a food journal and adhering to recommended goals of 1500 calories and 95+ grams of protein.   Exercise goals: For substantial health benefits, adults should do at least 150 minutes (2 hours and 30 minutes) a week of moderate-intensity, or 75 minutes (1 hour and 15 minutes) a week of vigorous-intensity aerobic physical activity, or an equivalent combination of moderate- and vigorous-intensity aerobic activity. Aerobic activity should be performed in episodes of at least 10 minutes, and preferably, it should be spread throughout the week.  Behavioral modification strategies: increasing lean protein intake, increasing vegetables, meal planning and cooking strategies, keeping healthy foods in the home and planning for  success.  Cynthia Figueroa has agreed to follow-up with our clinic as needed. She was informed of the importance of frequent follow-up visits to maximize her success with intensive lifestyle modifications for her multiple health conditions.  Objective:   VITALS: Per  patient if applicable, see vitals. GENERAL: Alert and in no acute distress. CARDIOPULMONARY: No increased WOB. Speaking in clear sentences.  PSYCH: Pleasant and cooperative. Speech normal rate and rhythm. Affect is appropriate. Insight and judgement are appropriate. Attention is focused, linear, and appropriate.  NEURO: Oriented as arrived to appointment on time with no prompting.   Lab Results  Component Value Date   CREATININE 0.96 01/18/2019   BUN 10 01/18/2019   NA 144 01/18/2019   K 4.1 01/18/2019   CL 105 01/18/2019   CO2 25 01/18/2019   Lab Results  Component Value Date   ALT 13 01/18/2019   AST 16 01/18/2019   ALKPHOS 98 01/18/2019   BILITOT 0.2 01/18/2019   Lab Results  Component Value Date   HGBA1C 5.8 (H) 01/18/2019   HGBA1C 5.6 04/21/2018   HGBA1C 5.6 12/23/2017   Lab Results  Component Value Date   INSULIN 15.5 01/18/2019   INSULIN 19.9 04/21/2018   INSULIN 13.4 12/23/2017   Lab Results  Component Value Date   TSH 1.290 12/23/2017   Lab Results  Component Value Date   CHOL 157 01/18/2019   HDL 30 (L) 01/18/2019   LDLCALC 104 (H) 01/18/2019   TRIG 124 01/18/2019   Lab Results  Component Value Date   WBC 9.7 12/23/2017   HGB 12.8 12/23/2017   HCT 40.5 12/23/2017   MCV 87 12/23/2017   PLT 427 (H) 08/04/2013   Attestation Statements:   Reviewed by clinician on day of visit: allergies, medications, problem list, medical history, surgical history, family history, social history, and previous encounter notes.  I, Water quality scientist, CMA, am acting as transcriptionist for Coralie Common, MD.  I have reviewed the above documentation for accuracy and completeness, and I agree with the above. - Ilene Qua, MD

## 2019-06-13 ENCOUNTER — Encounter (INDEPENDENT_AMBULATORY_CARE_PROVIDER_SITE_OTHER): Payer: Self-pay | Admitting: Family Medicine

## 2019-06-14 NOTE — Telephone Encounter (Signed)
Please advise 

## 2019-06-16 MED FILL — DOXYLAMINE-PYRIDOXINE 10-10: 10-10 | 30 days supply | Qty: 60 | Fill #2

## 2019-06-19 DIAGNOSIS — N942 Vaginismus: Secondary | ICD-10-CM | POA: Diagnosis not present

## 2019-06-19 DIAGNOSIS — O209 Hemorrhage in early pregnancy, unspecified: Secondary | ICD-10-CM | POA: Diagnosis not present

## 2019-06-20 ENCOUNTER — Other Ambulatory Visit: Payer: Self-pay | Admitting: Nurse Practitioner

## 2019-06-20 ENCOUNTER — Telehealth: Payer: Self-pay

## 2019-06-20 DIAGNOSIS — F3289 Other specified depressive episodes: Secondary | ICD-10-CM

## 2019-06-20 MED ORDER — SERTRALINE HCL 50 MG PO TABS: 75.0000 mg | ORAL_TABLET | Freq: Every day | ORAL | 0 refills | Status: DC

## 2019-06-20 NOTE — Telephone Encounter (Signed)
I attempted to call pt to schedule an appt we will not refill any meds due to her not being seen in a long time. YL,RMA

## 2019-06-27 DIAGNOSIS — Z113 Encounter for screening for infections with a predominantly sexual mode of transmission: Secondary | ICD-10-CM | POA: Diagnosis not present

## 2019-06-27 DIAGNOSIS — Z363 Encounter for antenatal screening for malformations: Secondary | ICD-10-CM | POA: Diagnosis not present

## 2019-06-27 DIAGNOSIS — Z3482 Encounter for supervision of other normal pregnancy, second trimester: Secondary | ICD-10-CM | POA: Diagnosis not present

## 2019-06-27 DIAGNOSIS — Z31430 Encounter of female for testing for genetic disease carrier status for procreative management: Secondary | ICD-10-CM | POA: Diagnosis not present

## 2019-06-27 DIAGNOSIS — Z3492 Encounter for supervision of normal pregnancy, unspecified, second trimester: Secondary | ICD-10-CM | POA: Diagnosis not present

## 2019-06-27 DIAGNOSIS — O219 Vomiting of pregnancy, unspecified: Secondary | ICD-10-CM | POA: Diagnosis not present

## 2019-06-27 DIAGNOSIS — Z3A15 15 weeks gestation of pregnancy: Secondary | ICD-10-CM | POA: Diagnosis not present

## 2019-06-27 DIAGNOSIS — Z369 Encounter for antenatal screening, unspecified: Secondary | ICD-10-CM | POA: Diagnosis not present

## 2019-06-28 ENCOUNTER — Other Ambulatory Visit (HOSPITAL_COMMUNITY): Payer: Self-pay | Admitting: Obstetrics and Gynecology

## 2019-06-28 DIAGNOSIS — Z363 Encounter for antenatal screening for malformations: Secondary | ICD-10-CM

## 2019-07-25 ENCOUNTER — Ambulatory Visit (HOSPITAL_COMMUNITY): Payer: 59

## 2019-07-25 ENCOUNTER — Encounter (HOSPITAL_COMMUNITY): Payer: Self-pay

## 2019-07-27 ENCOUNTER — Ambulatory Visit (HOSPITAL_BASED_OUTPATIENT_CLINIC_OR_DEPARTMENT_OTHER)
Admission: RE | Admit: 2019-07-27 | Discharge: 2019-07-27 | Disposition: A | Discharge status: Home / Self Care | Payer: 59 | Source: Ambulatory Visit | Attending: Obstetrics and Gynecology | Admitting: Obstetrics and Gynecology

## 2019-07-27 ENCOUNTER — Other Ambulatory Visit (HOSPITAL_COMMUNITY): Payer: Self-pay | Admitting: Obstetrics and Gynecology

## 2019-07-27 ENCOUNTER — Inpatient Hospital Stay (HOSPITAL_BASED_OUTPATIENT_CLINIC_OR_DEPARTMENT_OTHER): Payer: 59

## 2019-07-27 ENCOUNTER — Inpatient Hospital Stay (HOSPITAL_COMMUNITY)
Admission: AD | Admit: 2019-07-27 | Discharge: 2019-07-27 | Disposition: A | Discharge status: Home / Self Care | Payer: 59 | Attending: Obstetrics & Gynecology | Admitting: Obstetrics & Gynecology

## 2019-07-27 ENCOUNTER — Other Ambulatory Visit: Payer: Self-pay

## 2019-07-27 DIAGNOSIS — Z3A20 20 weeks gestation of pregnancy: Secondary | ICD-10-CM

## 2019-07-27 DIAGNOSIS — Z363 Encounter for antenatal screening for malformations: Secondary | ICD-10-CM | POA: Insufficient documentation

## 2019-07-27 DIAGNOSIS — Z362 Encounter for other antenatal screening follow-up: Secondary | ICD-10-CM

## 2019-07-27 DIAGNOSIS — O9A212 Injury, poisoning and certain other consequences of external causes complicating pregnancy, second trimester: Secondary | ICD-10-CM | POA: Diagnosis not present

## 2019-07-27 DIAGNOSIS — T1490XA Injury, unspecified, initial encounter: Secondary | ICD-10-CM | POA: Diagnosis not present

## 2019-07-27 DIAGNOSIS — O321XX0 Maternal care for breech presentation, not applicable or unspecified: Secondary | ICD-10-CM | POA: Diagnosis not present

## 2019-07-27 DIAGNOSIS — O10012 Pre-existing essential hypertension complicating pregnancy, second trimester: Secondary | ICD-10-CM | POA: Diagnosis not present
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
## 2019-07-27 NOTE — Progress Notes (Signed)
Written and verbal d/c instructions given and understanding vocied 

## 2019-07-27 NOTE — MAU Note (Signed)
Wynelle Bourgeois CNM in Triage to talk with pt and discuss u/s results. Pt then d/c home from Triage
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
## 2019-07-27 NOTE — MAU Note (Signed)
Donia Ast NP in triage to see pt and then pt and spouse ambulated to u/s. PT will wait in lobby for results and then MAU provider will discuss u/s results with pt. Pt voices understanding and agrees with POC
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
## 2019-07-27 NOTE — Discharge Instructions (Signed)

## 2019-07-28 ENCOUNTER — Encounter (HOSPITAL_COMMUNITY): Payer: Self-pay | Admitting: Obstetrics and Gynecology

## 2019-07-28 ENCOUNTER — Other Ambulatory Visit (HOSPITAL_COMMUNITY): Payer: Self-pay | Admitting: *Deleted

## 2019-07-28 DIAGNOSIS — O10912 Unspecified pre-existing hypertension complicating pregnancy, second trimester: Secondary | ICD-10-CM

## 2019-07-28 DIAGNOSIS — O26819 Pregnancy related exhaustion and fatigue, unspecified trimester: Secondary | ICD-10-CM | POA: Diagnosis not present

## 2019-07-28 MED FILL — CYCLOBENZAPRINE HCL 10 MG T: 10 | 10 days supply | Qty: 30 | Fill #0

## 2019-07-28 MED FILL — SERTRALINE HCL 50 MG TABLET: 50 | 60 days supply | Qty: 60 | Fill #0

## 2019-07-28 MED FILL — DOXYLAMINE-PYRIDOXINE 10-10: 10-10 | 30 days supply | Qty: 60 | Fill #0

## 2019-08-10 DIAGNOSIS — F430 Acute stress reaction: Secondary | ICD-10-CM | POA: Diagnosis not present

## 2019-08-25 ENCOUNTER — Other Ambulatory Visit: Payer: Self-pay

## 2019-08-25 ENCOUNTER — Ambulatory Visit (HOSPITAL_COMMUNITY): Payer: 59 | Attending: Obstetrics and Gynecology

## 2019-08-25 DIAGNOSIS — Z362 Encounter for other antenatal screening follow-up: Secondary | ICD-10-CM | POA: Diagnosis not present

## 2019-08-25 DIAGNOSIS — O10012 Pre-existing essential hypertension complicating pregnancy, second trimester: Secondary | ICD-10-CM

## 2019-08-25 DIAGNOSIS — O10912 Unspecified pre-existing hypertension complicating pregnancy, second trimester: Secondary | ICD-10-CM | POA: Diagnosis not present

## 2019-08-25 DIAGNOSIS — Z3A24 24 weeks gestation of pregnancy: Secondary | ICD-10-CM

## 2019-08-29 MED FILL — DOXYLAMINE-PYRIDOXINE 10-10: 10-10 | 30 days supply | Qty: 60 | Fill #1

## 2019-08-31 ENCOUNTER — Encounter: Payer: Self-pay | Admitting: Nurse Practitioner

## 2019-08-31 DIAGNOSIS — D563 Thalassemia minor: Secondary | ICD-10-CM

## 2019-08-31 HISTORY — DX: Thalassemia minor: D56.3

## 2019-09-02 IMAGING — US US THYROID
1 series · 14 of 25 positions shown · non-contrast
Comparison: Prior thyroid ultrasound 09/30/2012

CLINICAL DATA: Hypothyroid. 31-year-old female with hypothyroidism
on thyroid hormone replacement therapy.

EXAM:
THYROID ULTRASOUND
TECHNIQUE: Ultrasound examination of the thyroid gland and adjacent soft
tissues was performed.

[Series 1: us thyroid · 0.04mm/px · 14 of 41 slices shown]
[im 1/41]
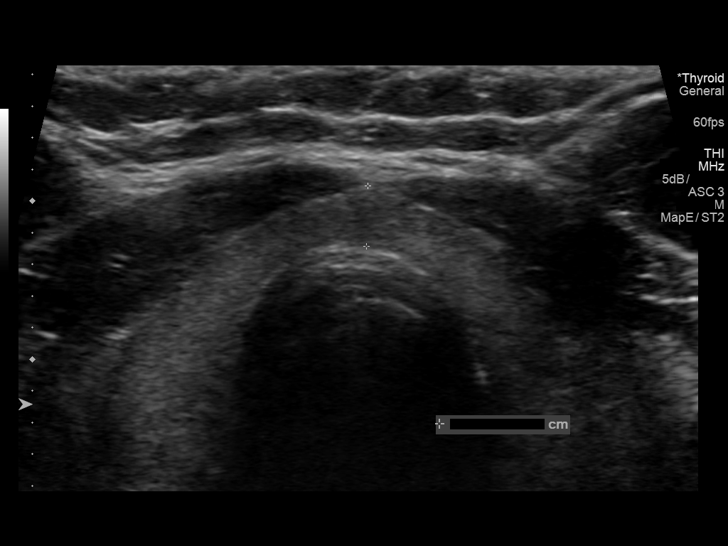
[im 4/41]
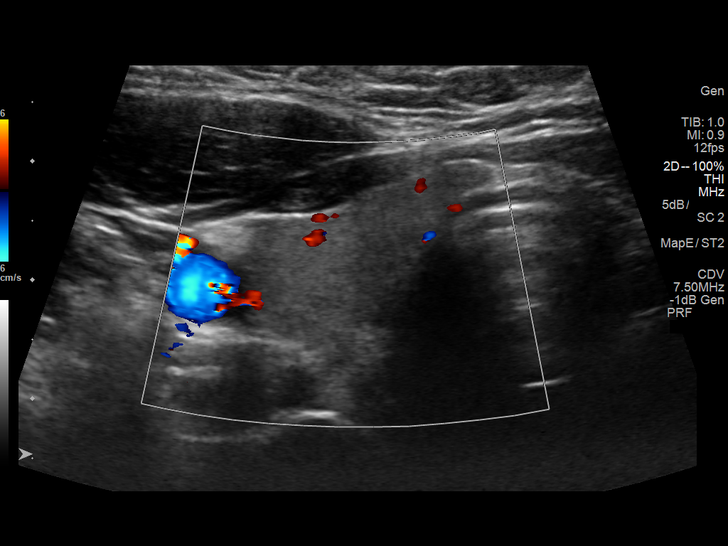
[im 7/41]
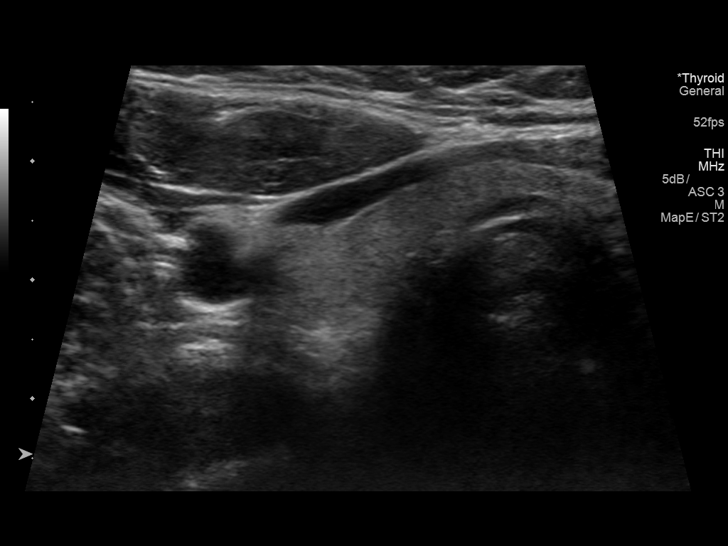
[im 11/41]
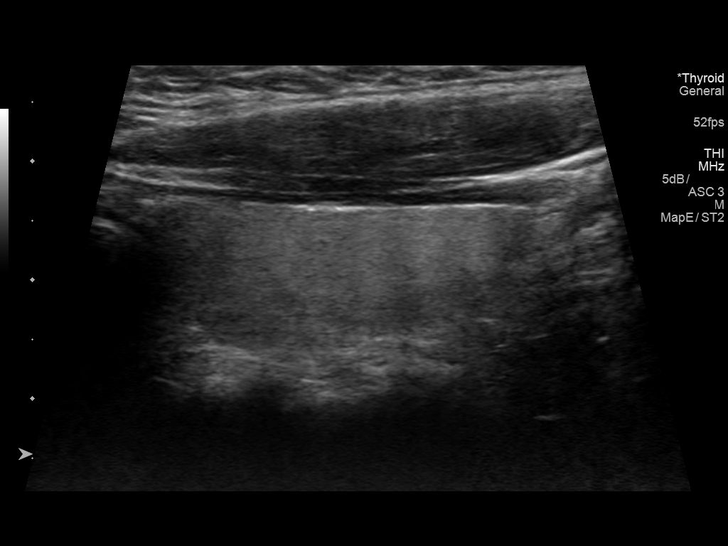
[im 14/41]
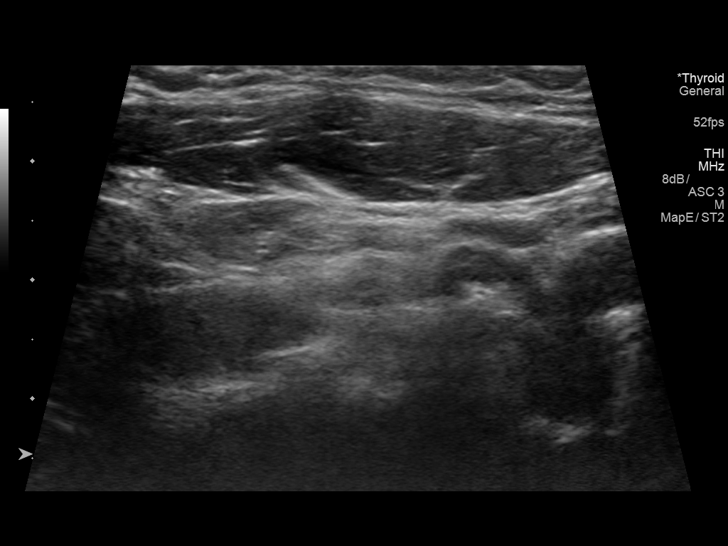
[im 16/41]
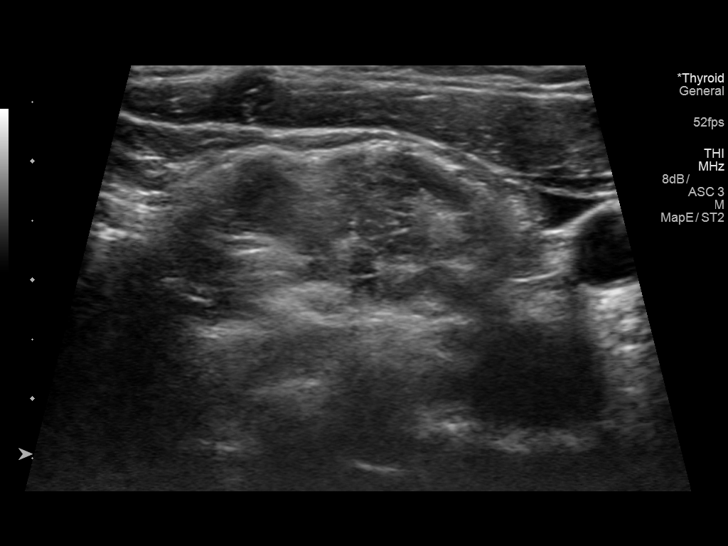
[im 19/41]
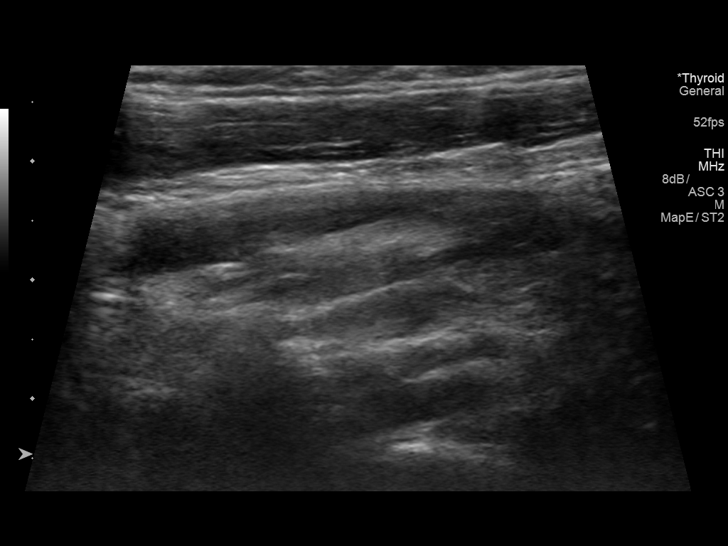
[im 22/41]
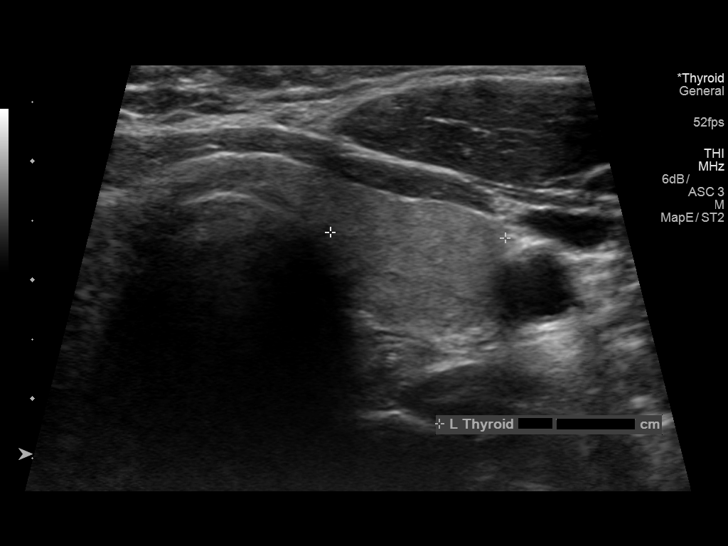
[im 26/41]
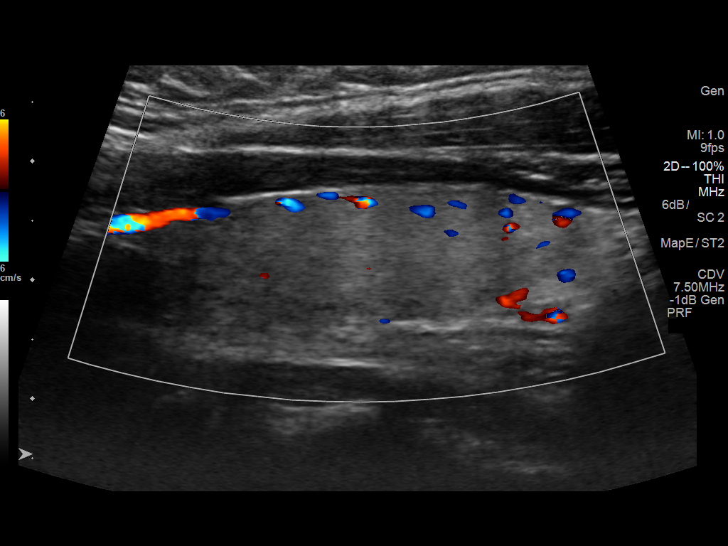
[im 27/41]
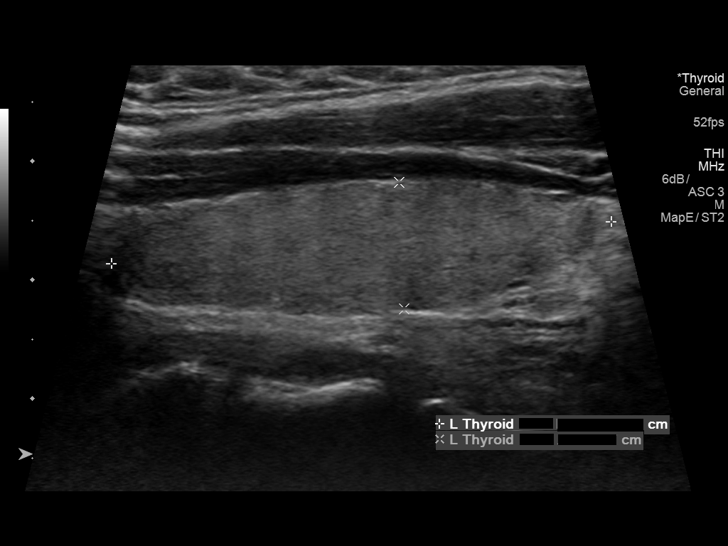
[im 31/41]
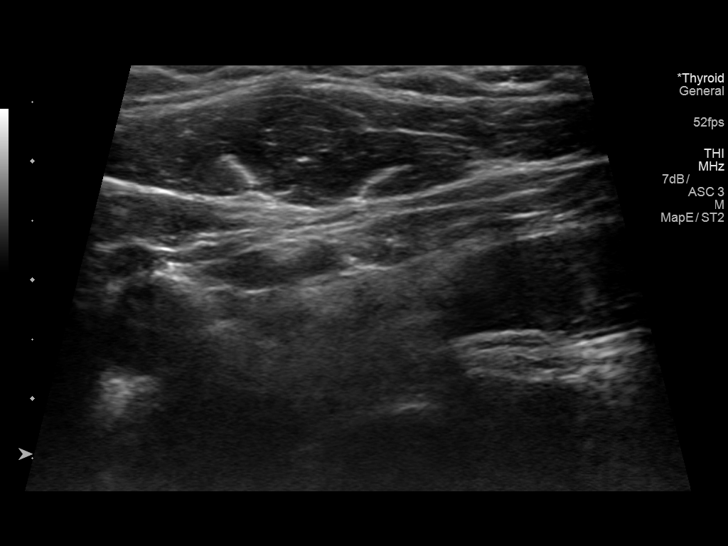
[im 34/41]
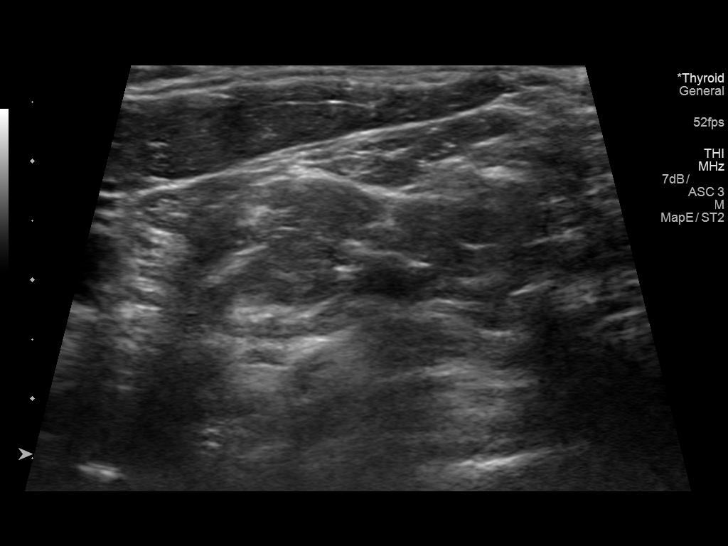
[im 37/41]
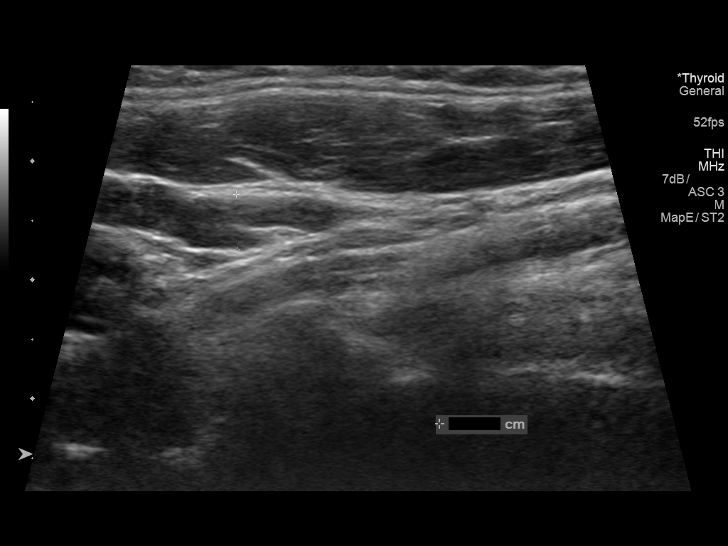
[im 41/41]
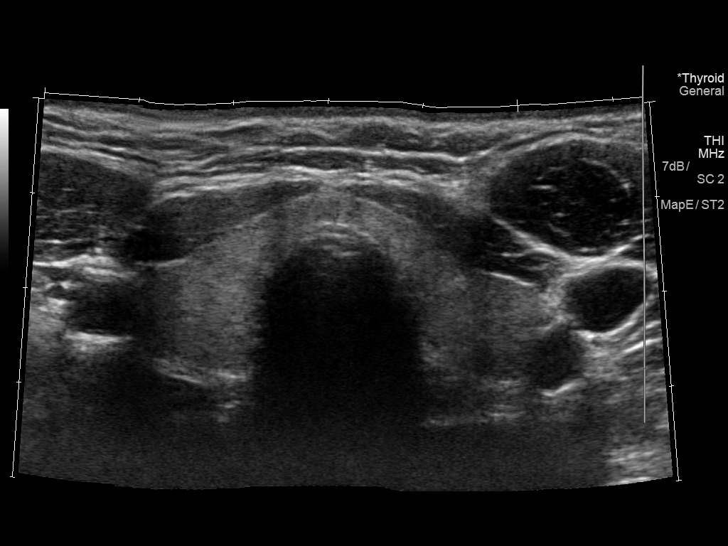

[14 of 25 positions shown; findings below may reference images not displayed]

FINDINGS: Parenchymal Echotexture: Mildly heterogenous

Isthmus: 0.4 cm

Right lobe: 3.5 x 1.3 x 1.5 cm

Left lobe: 4.2 x 1.1 x 1.5 cm

_________________________________________________________

Estimated total number of nodules >/= 1 cm: 0

Number of spongiform nodules >/=  2 cm not described below (TR1): 0

Number of mixed cystic and solid nodules >/= 1.5 cm not described
below (TR2): 0

_________________________________________________________

No discrete nodules are seen within the thyroid gland.
IMPRESSION: Mildly heterogeneous but otherwise unremarkable sonographic
appearance of the thyroid gland.

## 2019-09-19 DIAGNOSIS — Z23 Encounter for immunization: Secondary | ICD-10-CM | POA: Diagnosis not present

## 2019-09-19 DIAGNOSIS — Z3492 Encounter for supervision of normal pregnancy, unspecified, second trimester: Secondary | ICD-10-CM | POA: Diagnosis not present

## 2019-09-27 DIAGNOSIS — Z331 Pregnant state, incidental: Secondary | ICD-10-CM | POA: Diagnosis not present

## 2019-09-27 DIAGNOSIS — O09819 Supervision of pregnancy resulting from assisted reproductive technology, unspecified trimester: Secondary | ICD-10-CM | POA: Diagnosis not present

## 2019-09-27 DIAGNOSIS — Z3A29 29 weeks gestation of pregnancy: Secondary | ICD-10-CM | POA: Diagnosis not present

## 2019-09-27 DIAGNOSIS — F419 Anxiety disorder, unspecified: Secondary | ICD-10-CM | POA: Diagnosis not present

## 2019-09-27 DIAGNOSIS — Z148 Genetic carrier of other disease: Secondary | ICD-10-CM | POA: Diagnosis not present

## 2019-09-27 DIAGNOSIS — F329 Major depressive disorder, single episode, unspecified: Secondary | ICD-10-CM | POA: Diagnosis not present

## 2019-10-04 MED FILL — DOXYLAMINE-PYRIDOXINE 10-10: 10-10 | 30 days supply | Qty: 60 | Fill #2

## 2019-10-18 ENCOUNTER — Encounter: Payer: Self-pay | Admitting: Nurse Practitioner

## 2019-10-24 DIAGNOSIS — Z3482 Encounter for supervision of other normal pregnancy, second trimester: Secondary | ICD-10-CM | POA: Diagnosis not present

## 2019-10-24 DIAGNOSIS — Z3483 Encounter for supervision of other normal pregnancy, third trimester: Secondary | ICD-10-CM | POA: Diagnosis not present

## 2019-11-06 DIAGNOSIS — O09819 Supervision of pregnancy resulting from assisted reproductive technology, unspecified trimester: Secondary | ICD-10-CM | POA: Diagnosis not present

## 2019-11-06 DIAGNOSIS — Z3A34 34 weeks gestation of pregnancy: Secondary | ICD-10-CM | POA: Diagnosis not present

## 2019-11-09 MED FILL — SERTRALINE HCL 50 MG TABLET: 50 | 60 days supply | Qty: 60 | Fill #1

## 2019-11-09 MED FILL — DOXYLAMINE-PYRIDOXINE 10-10: 10-10 | 30 days supply | Qty: 60 | Fill #3

## 2019-11-21 DIAGNOSIS — Z3492 Encounter for supervision of normal pregnancy, unspecified, second trimester: Secondary | ICD-10-CM | POA: Diagnosis not present

## 2019-11-24 DIAGNOSIS — F430 Acute stress reaction: Secondary | ICD-10-CM | POA: Diagnosis not present

## 2019-11-28 DIAGNOSIS — R102 Pelvic and perineal pain: Secondary | ICD-10-CM | POA: Diagnosis not present

## 2019-12-04 ENCOUNTER — Other Ambulatory Visit: Payer: Self-pay

## 2019-12-04 ENCOUNTER — Inpatient Hospital Stay (HOSPITAL_COMMUNITY): Payer: 59 | Admitting: Anesthesiology

## 2019-12-04 ENCOUNTER — Inpatient Hospital Stay (HOSPITAL_COMMUNITY)
Admission: EM | Admit: 2019-12-04 | Discharge: 2019-12-07 | DRG: 787 | Disposition: A | Discharge status: Home / Self Care | Payer: 59 | Attending: Obstetrics & Gynecology | Admitting: Obstetrics & Gynecology

## 2019-12-04 ENCOUNTER — Encounter (HOSPITAL_COMMUNITY)
Admission: EM | Disposition: A | Discharge status: Home / Self Care | Payer: Self-pay | Source: Home / Self Care | Attending: Obstetrics & Gynecology

## 2019-12-04 ENCOUNTER — Encounter (HOSPITAL_COMMUNITY): Payer: Self-pay | Admitting: Obstetrics & Gynecology

## 2019-12-04 DIAGNOSIS — O134 Gestational [pregnancy-induced] hypertension without significant proteinuria, complicating childbirth: Secondary | ICD-10-CM | POA: Diagnosis not present

## 2019-12-04 DIAGNOSIS — Z20822 Contact with and (suspected) exposure to covid-19: Secondary | ICD-10-CM | POA: Diagnosis present

## 2019-12-04 DIAGNOSIS — O99892 Other specified diseases and conditions complicating childbirth: Secondary | ICD-10-CM | POA: Diagnosis present

## 2019-12-04 DIAGNOSIS — O139 Gestational [pregnancy-induced] hypertension without significant proteinuria, unspecified trimester: Secondary | ICD-10-CM | POA: Diagnosis not present

## 2019-12-04 DIAGNOSIS — Z3A38 38 weeks gestation of pregnancy: Secondary | ICD-10-CM | POA: Diagnosis not present

## 2019-12-04 DIAGNOSIS — N942 Vaginismus: Secondary | ICD-10-CM | POA: Diagnosis present

## 2019-12-04 DIAGNOSIS — O9081 Anemia of the puerperium: Secondary | ICD-10-CM | POA: Diagnosis not present

## 2019-12-04 DIAGNOSIS — Z3A00 Weeks of gestation of pregnancy not specified: Secondary | ICD-10-CM | POA: Diagnosis not present

## 2019-12-04 DIAGNOSIS — D563 Thalassemia minor: Secondary | ICD-10-CM | POA: Diagnosis present

## 2019-12-04 DIAGNOSIS — O133 Gestational [pregnancy-induced] hypertension without significant proteinuria, third trimester: Secondary | ICD-10-CM

## 2019-12-04 DIAGNOSIS — O3463 Maternal care for abnormality of vagina, third trimester: Secondary | ICD-10-CM | POA: Diagnosis not present

## 2019-12-04 DIAGNOSIS — F419 Anxiety disorder, unspecified: Secondary | ICD-10-CM | POA: Diagnosis present

## 2019-12-04 DIAGNOSIS — F32A Depression, unspecified: Secondary | ICD-10-CM

## 2019-12-04 DIAGNOSIS — F525 Vaginismus not due to a substance or known physiological condition: Secondary | ICD-10-CM

## 2019-12-04 DIAGNOSIS — R030 Elevated blood-pressure reading, without diagnosis of hypertension: Secondary | ICD-10-CM | POA: Diagnosis present

## 2019-12-04 DIAGNOSIS — D62 Acute posthemorrhagic anemia: Secondary | ICD-10-CM | POA: Diagnosis not present

## 2019-12-04 HISTORY — DX: Depression, unspecified: F32.A

## 2019-12-04 HISTORY — DX: Vaginismus not due to a substance or known physiological condition: F52.5

## 2019-12-04 HISTORY — DX: Gestational (pregnancy-induced) hypertension without significant proteinuria, third trimester: O13.3

## 2019-12-04 LAB — COMPREHENSIVE METABOLIC PANEL
ALT: 14 U/L (ref 0–44)
AST: 32 U/L (ref 15–41)
Albumin: 2.4 g/dL — ABNORMAL LOW (ref 3.5–5.0)
Alkaline Phosphatase: 157 U/L — ABNORMAL HIGH (ref 38–126)
Anion gap: 11 (ref 5–15)
BUN: 7 mg/dL (ref 6–20)
CO2: 19 mmol/L — ABNORMAL LOW (ref 22–32)
Calcium: 8.6 mg/dL — ABNORMAL LOW (ref 8.9–10.3)
Chloride: 106 mmol/L (ref 98–111)
Creatinine, Ser: 0.87 mg/dL (ref 0.44–1.00)
GFR calc Af Amer: >60 mL/min (ref >60)
GFR calc non Af Amer: >60 mL/min (ref >60)
Glucose, Bld: 78 mg/dL (ref 70–99)
Potassium: 4.1 mmol/L (ref 3.5–5.1)
Sodium: 136 mmol/L (ref 135–145)
Total Bilirubin: 0.8 mg/dL (ref 0.3–1.2)
Total Protein: 5.6 g/dL — ABNORMAL LOW (ref 6.5–8.1)

## 2019-12-04 LAB — CBC
HCT: 31.1 % — ABNORMAL LOW (ref 36.0–46.0)
HCT: 25.8 % — ABNORMAL LOW (ref 36.0–46.0)
Hemoglobin: 9.4 g/dL — ABNORMAL LOW (ref 12.0–15.0)
Hemoglobin: 8.2 g/dL — ABNORMAL LOW (ref 12.0–15.0)
MCH: 27.9 pg (ref 26.0–34.0)
MCH: 28.6 pg (ref 26.0–34.0)
MCHC: 30.2 g/dL (ref 30.0–36.0)
MCHC: 31.8 g/dL (ref 30.0–36.0)
MCV: 92.3 fL (ref 80.0–100.0)
MCV: 89.9 fL (ref 80.0–100.0)
Platelets: 266 10*3/uL (ref 150–400)
Platelets: 252 10*3/uL (ref 150–400)
RBC: 3.37 MIL/uL — ABNORMAL LOW (ref 3.87–5.11)
RBC: 2.87 MIL/uL — ABNORMAL LOW (ref 3.87–5.11)
RDW: 14 % (ref 11.5–15.5)
RDW: 14 % (ref 11.5–15.5)
WBC: 12.8 10*3/uL — ABNORMAL HIGH (ref 4.0–10.5)
WBC: 13.7 10*3/uL — ABNORMAL HIGH (ref 4.0–10.5)
nRBC: 0 % (ref 0.0–0.2)
nRBC: 0 % (ref 0.0–0.2)

## 2019-12-04 LAB — PROTEIN / CREATININE RATIO, URINE
Creatinine, Urine: 335.39 mg/dL
Protein Creatinine Ratio: 0.26 mg/mg{Cre} — ABNORMAL HIGH (ref 0.00–0.15)
Total Protein, Urine: 88 mg/dL

## 2019-12-04 LAB — URINALYSIS, ROUTINE W REFLEX MICROSCOPIC
Bilirubin Urine: NEGATIVE
Glucose, UA: NEGATIVE mg/dL
Hgb urine dipstick: NEGATIVE
Ketones, ur: NEGATIVE mg/dL
Nitrite: NEGATIVE
Protein, ur: 30 mg/dL — AB
Specific Gravity, Urine: 1.019 (ref 1.005–1.030)
pH: 6 (ref 5.0–8.0)

## 2019-12-04 LAB — TYPE AND SCREEN
ABO/RH(D): O POS
Antibody Screen: NEGATIVE

## 2019-12-04 LAB — SARS CORONAVIRUS 2 BY RT PCR (HOSPITAL ORDER, PERFORMED IN ~~LOC~~ HOSPITAL LAB): SARS Coronavirus 2: NEGATIVE

## 2019-12-04 SURGERY — Surgical Case
Anesthesia: Epidural

## 2019-12-04 MED ORDER — ACETAMINOPHEN 325 MG PO TABS: 325.0000 mg | ORAL_TABLET | ORAL | Status: DC | PRN

## 2019-12-04 MED ORDER — LABETALOL HCL 5 MG/ML IV SOLN
80.0000 mg | INTRAVENOUS | Status: DC | PRN
  Administered 2019-12-04: 80 mg via INTRAVENOUS
  Filled 2019-12-04: qty 16

## 2019-12-04 MED ORDER — SIMETHICONE 80 MG PO CHEW
80.0000 mg | CHEWABLE_TABLET | ORAL | Status: DC | PRN
  Administered 2019-12-07: 80 mg via ORAL
  Filled 2019-12-04: qty 1

## 2019-12-04 MED ORDER — OXYTOCIN-SODIUM CHLORIDE 30-0.9 UT/500ML-% IV SOLN: 2.5000 [IU]/h | INTRAVENOUS | Status: DC

## 2019-12-04 MED ORDER — WITCH HAZEL-GLYCERIN EX PADS: 1.0000 "application " | MEDICATED_PAD | CUTANEOUS | Status: DC | PRN

## 2019-12-04 MED ORDER — CEFAZOLIN SODIUM-DEXTROSE 2-3 GM-%(50ML) IV SOLR
INTRAVENOUS | Status: DC | PRN
  Administered 2019-12-04: 2 g via INTRAVENOUS

## 2019-12-04 MED ORDER — KETOROLAC TROMETHAMINE 30 MG/ML IJ SOLN
30.0000 mg | Freq: Once | INTRAMUSCULAR | Status: AC
  Administered 2019-12-04: 30 mg via INTRAVENOUS
  Filled 2019-12-04: qty 1

## 2019-12-04 MED ORDER — ONDANSETRON HCL 4 MG/2ML IJ SOLN
INTRAMUSCULAR | Status: AC
  Filled 2019-12-04: qty 2

## 2019-12-04 MED ORDER — FENTANYL CITRATE (PF) 100 MCG/2ML IJ SOLN
INTRAMUSCULAR | Status: AC
  Filled 2019-12-04: qty 2

## 2019-12-04 MED ORDER — HYDRALAZINE HCL 20 MG/ML IJ SOLN: 10.0000 mg | INTRAMUSCULAR | Status: DC | PRN

## 2019-12-04 MED ORDER — PHENYLEPHRINE 40 MCG/ML (10ML) SYRINGE FOR IV PUSH (FOR BLOOD PRESSURE SUPPORT): 80.0000 ug | PREFILLED_SYRINGE | INTRAVENOUS | Status: DC | PRN

## 2019-12-04 MED ORDER — EPHEDRINE 5 MG/ML INJ: 10.0000 mg | INTRAVENOUS | Status: DC | PRN

## 2019-12-04 MED ORDER — DIPHENHYDRAMINE HCL 50 MG/ML IJ SOLN: 12.5000 mg | INTRAMUSCULAR | Status: DC | PRN

## 2019-12-04 MED ORDER — MEPERIDINE HCL 25 MG/ML IJ SOLN: 6.2500 mg | INTRAMUSCULAR | Status: DC | PRN

## 2019-12-04 MED ORDER — DIBUCAINE (PERIANAL) 1 % EX OINT: 1.0000 "application " | TOPICAL_OINTMENT | CUTANEOUS | Status: DC | PRN

## 2019-12-04 MED ORDER — KETOROLAC TROMETHAMINE 30 MG/ML IJ SOLN
INTRAMUSCULAR | Status: AC
  Filled 2019-12-04: qty 1

## 2019-12-04 MED ORDER — PHENYLEPHRINE 40 MCG/ML (10ML) SYRINGE FOR IV PUSH (FOR BLOOD PRESSURE SUPPORT)
80.0000 ug | PREFILLED_SYRINGE | INTRAVENOUS | Status: DC | PRN
  Filled 2019-12-04: qty 10

## 2019-12-04 MED ORDER — BUPIVACAINE HCL (PF) 0.75 % IJ SOLN
INTRAMUSCULAR | Status: DC | PRN
Start: 2019-12-04 — End: 2019-12-04
  Administered 2019-12-04: 12 mL/h via EPIDURAL

## 2019-12-04 MED ORDER — ONDANSETRON HCL 4 MG/2ML IJ SOLN: 4.0000 mg | Freq: Once | INTRAMUSCULAR | Status: DC | PRN

## 2019-12-04 MED ORDER — LABETALOL HCL 5 MG/ML IV SOLN
40.0000 mg | INTRAVENOUS | Status: DC | PRN
  Administered 2019-12-04: 40 mg via INTRAVENOUS
  Filled 2019-12-04: qty 8

## 2019-12-04 MED ORDER — PRENATAL MULTIVITAMIN CH
1.0000 | ORAL_TABLET | Freq: Every day | ORAL | Status: DC
  Administered 2019-12-06: 1 via ORAL
  Filled 2019-12-04 (×3): qty 1

## 2019-12-04 MED ORDER — OXYCODONE HCL 5 MG PO TABS: 5.0000 mg | ORAL_TABLET | Freq: Once | ORAL | Status: DC | PRN

## 2019-12-04 MED ORDER — LIDOCAINE HCL (PF) 2 % IJ SOLN
INTRAMUSCULAR | Status: AC
  Filled 2019-12-04: qty 5

## 2019-12-04 MED ORDER — ZOLPIDEM TARTRATE 5 MG PO TABS: 5.0000 mg | ORAL_TABLET | Freq: Every evening | ORAL | Status: DC | PRN

## 2019-12-04 MED ORDER — LACTATED RINGERS IV SOLN: 500.0000 mL | INTRAVENOUS | Status: DC | PRN

## 2019-12-04 MED ORDER — OXYTOCIN-SODIUM CHLORIDE 30-0.9 UT/500ML-% IV SOLN
INTRAVENOUS | Status: AC
  Filled 2019-12-04: qty 500

## 2019-12-04 MED ORDER — LIDOCAINE HCL (PF) 1 % IJ SOLN
INTRAMUSCULAR | Status: DC | PRN
  Administered 2019-12-04: 6 mL via EPIDURAL

## 2019-12-04 MED ORDER — LACTATED RINGERS IV SOLN: INTRAVENOUS | Status: DC

## 2019-12-04 MED ORDER — LACTATED RINGERS IV SOLN: 500.0000 mL | Freq: Once | INTRAVENOUS | Status: DC

## 2019-12-04 MED ORDER — SODIUM CHLORIDE 0.9 % IV SOLN
8.0000 mg | Freq: Three times a day (TID) | INTRAVENOUS | Status: DC | PRN
  Filled 2019-12-04: qty 4

## 2019-12-04 MED ORDER — SODIUM BICARBONATE 8.4 % IV SOLN
INTRAVENOUS | Status: DC | PRN
  Administered 2019-12-04: 3 mL via EPIDURAL
  Administered 2019-12-04: 7 mL via EPIDURAL
  Administered 2019-12-04: 2 mL via EPIDURAL

## 2019-12-04 MED ORDER — SODIUM CHLORIDE 0.9 % IR SOLN
Status: DC | PRN
  Administered 2019-12-04: 1

## 2019-12-04 MED ORDER — OXYCODONE-ACETAMINOPHEN 5-325 MG PO TABS
1.0000 | ORAL_TABLET | ORAL | Status: DC | PRN
  Filled 2019-12-04: qty 2

## 2019-12-04 MED ORDER — LACTATED RINGERS IV SOLN: INTRAVENOUS | Status: DC | PRN

## 2019-12-04 MED ORDER — STERILE WATER FOR IRRIGATION IR SOLN
Status: DC | PRN
  Administered 2019-12-04: 1

## 2019-12-04 MED ORDER — OXYTOCIN BOLUS FROM INFUSION: 333.0000 mL | Freq: Once | INTRAVENOUS | Status: DC

## 2019-12-04 MED ORDER — ACETAMINOPHEN 325 MG PO TABS: 650.0000 mg | ORAL_TABLET | ORAL | Status: DC | PRN

## 2019-12-04 MED ORDER — OXYCODONE-ACETAMINOPHEN 5-325 MG PO TABS: 1.0000 | ORAL_TABLET | ORAL | Status: DC | PRN

## 2019-12-04 MED ORDER — OXYCODONE HCL 5 MG/5ML PO SOLN: 5.0000 mg | Freq: Once | ORAL | Status: DC | PRN

## 2019-12-04 MED ORDER — OXYTOCIN-SODIUM CHLORIDE 30-0.9 UT/500ML-% IV SOLN: 2.5000 [IU]/h | INTRAVENOUS | Status: AC

## 2019-12-04 MED ORDER — DIPHENHYDRAMINE HCL 25 MG PO CAPS: 25.0000 mg | ORAL_CAPSULE | Freq: Four times a day (QID) | ORAL | Status: DC | PRN

## 2019-12-04 MED ORDER — OXYTOCIN-SODIUM CHLORIDE 30-0.9 UT/500ML-% IV SOLN
INTRAVENOUS | Status: DC | PRN
  Administered 2019-12-04: 500 mL via INTRAVENOUS

## 2019-12-04 MED ORDER — ACETAMINOPHEN 160 MG/5ML PO SOLN: 325.0000 mg | ORAL | Status: DC | PRN

## 2019-12-04 MED ORDER — LIDOCAINE HCL 1 % IJ SOLN
INTRAMUSCULAR | Status: AC
  Filled 2019-12-04: qty 20

## 2019-12-04 MED ORDER — MENTHOL 3 MG MT LOZG: 1.0000 | LOZENGE | OROMUCOSAL | Status: DC | PRN

## 2019-12-04 MED ORDER — SENNOSIDES-DOCUSATE SODIUM 8.6-50 MG PO TABS
2.0000 | ORAL_TABLET | ORAL | Status: DC
  Administered 2019-12-04 – 2019-12-05 (×2): 2 via ORAL
  Filled 2019-12-04 (×2): qty 2

## 2019-12-04 MED ORDER — MORPHINE SULFATE (PF) 0.5 MG/ML IJ SOLN
INTRAMUSCULAR | Status: DC | PRN
Start: 2019-12-04 — End: 2019-12-04
  Administered 2019-12-04: 3 mg via EPIDURAL

## 2019-12-04 MED ORDER — LABETALOL HCL 5 MG/ML IV SOLN
20.0000 mg | INTRAVENOUS | Status: DC | PRN
  Administered 2019-12-04: 20 mg via INTRAVENOUS
  Filled 2019-12-04: qty 4

## 2019-12-04 MED ORDER — FENTANYL CITRATE (PF) 100 MCG/2ML IJ SOLN
INTRAMUSCULAR | Status: DC | PRN
Start: 2019-12-04 — End: 2019-12-04
  Administered 2019-12-04: 100 ug via EPIDURAL

## 2019-12-04 MED ORDER — MORPHINE SULFATE (PF) 0.5 MG/ML IJ SOLN
INTRAMUSCULAR | Status: AC
  Filled 2019-12-04: qty 10

## 2019-12-04 MED ORDER — COCONUT OIL OIL: 1.0000 "application " | TOPICAL_OIL | Status: DC | PRN

## 2019-12-04 MED ORDER — FENTANYL-BUPIVACAINE-NACL 0.5-0.125-0.9 MG/250ML-% EP SOLN
12.0000 mL/h | EPIDURAL | Status: DC | PRN
  Filled 2019-12-04: qty 250

## 2019-12-04 MED ORDER — IBUPROFEN 800 MG PO TABS
800.0000 mg | ORAL_TABLET | Freq: Three times a day (TID) | ORAL | Status: DC
  Administered 2019-12-05 – 2019-12-07 (×6): 800 mg via ORAL
  Filled 2019-12-04 (×7): qty 1

## 2019-12-04 MED ORDER — OXYCODONE-ACETAMINOPHEN 5-325 MG PO TABS: 2.0000 | ORAL_TABLET | ORAL | Status: DC | PRN

## 2019-12-04 MED ORDER — SOD CITRATE-CITRIC ACID 500-334 MG/5ML PO SOLN
30.0000 mL | ORAL | Status: DC | PRN
  Administered 2019-12-04: 30 mL via ORAL
  Filled 2019-12-04: qty 30

## 2019-12-04 MED ORDER — LIDOCAINE HCL (PF) 1 % IJ SOLN: 30.0000 mL | INTRAMUSCULAR | Status: DC | PRN

## 2019-12-04 MED ORDER — ONDANSETRON HCL 4 MG/2ML IJ SOLN
INTRAMUSCULAR | Status: DC | PRN
  Administered 2019-12-04: 4 mg via INTRAVENOUS

## 2019-12-04 MED ORDER — OXYCODONE-ACETAMINOPHEN 5-325 MG PO TABS
2.0000 | ORAL_TABLET | ORAL | Status: DC | PRN
  Administered 2019-12-06: 2 via ORAL

## 2019-12-04 MED ORDER — FENTANYL CITRATE (PF) 100 MCG/2ML IJ SOLN
INTRAMUSCULAR | Status: DC | PRN
  Administered 2019-12-04 (×2): 50 ug via INTRAVENOUS

## 2019-12-04 MED ORDER — IBUPROFEN 800 MG PO TABS: 800.0000 mg | ORAL_TABLET | Freq: Three times a day (TID) | ORAL | Status: DC

## 2019-12-04 MED ORDER — FENTANYL-BUPIVACAINE-NACL 0.5-0.125-0.9 MG/250ML-% EP SOLN: 12.0000 mL/h | EPIDURAL | Status: DC | PRN

## 2019-12-04 MED ORDER — FENTANYL CITRATE (PF) 100 MCG/2ML IJ SOLN
25.0000 ug | INTRAMUSCULAR | Status: DC | PRN
  Administered 2019-12-04 (×3): 50 ug via INTRAVENOUS
  Administered 2019-12-04: 25 ug via INTRAVENOUS

## 2019-12-04 MED ORDER — ONDANSETRON HCL 4 MG/2ML IJ SOLN: 4.0000 mg | Freq: Four times a day (QID) | INTRAMUSCULAR | Status: DC | PRN

## 2019-12-04 MED ORDER — EPINEPHRINE PF 1 MG/ML IJ SOLN
INTRAMUSCULAR | Status: AC
  Filled 2019-12-04: qty 1

## 2019-12-04 SURGICAL SUPPLY — 38 items
BENZOIN TINCTURE PRP APPL 2/3 (GAUZE/BANDAGES/DRESSINGS) ×2 IMPLANT
CHLORAPREP W/TINT 26ML (MISCELLANEOUS) ×2 IMPLANT
CLAMP CORD UMBIL (MISCELLANEOUS) IMPLANT
CLOSURE STERI STRIP 1/2 X4 (GAUZE/BANDAGES/DRESSINGS) ×2 IMPLANT
CLOTH BEACON ORANGE TIMEOUT ST (SAFETY) ×2 IMPLANT
DERMABOND ADVANCED (GAUZE/BANDAGES/DRESSINGS)
DERMABOND ADVANCED .7 DNX12 (GAUZE/BANDAGES/DRESSINGS) IMPLANT
DRAPE C SECTION CLR SCREEN (DRAPES) ×2 IMPLANT
DRSG OPSITE POSTOP 4X10 (GAUZE/BANDAGES/DRESSINGS) ×2 IMPLANT
ELECT REM PT RETURN 9FT ADLT (ELECTROSURGICAL) ×2
ELECTRODE REM PT RTRN 9FT ADLT (ELECTROSURGICAL) ×1 IMPLANT
EXTRACTOR VACUUM M CUP 4 TUBE (SUCTIONS) IMPLANT
GLOVE BIOGEL PI IND STRL 7.0 (GLOVE) ×2 IMPLANT
GLOVE BIOGEL PI INDICATOR 7.0 (GLOVE) ×2
GLOVE SURG SS PI 6.5 STRL IVOR (GLOVE) ×2 IMPLANT
GOWN STRL REUS W/TWL LRG LVL3 (GOWN DISPOSABLE) ×4 IMPLANT
KIT ABG SYR 3ML LUER SLIP (SYRINGE) IMPLANT
NEEDLE HYPO 25X5/8 SAFETYGLIDE (NEEDLE) IMPLANT
NS IRRIG 1000ML POUR BTL (IV SOLUTION) ×2 IMPLANT
PACK C SECTION WH (CUSTOM PROCEDURE TRAY) ×2 IMPLANT
PAD OB MATERNITY 4.3X12.25 (PERSONAL CARE ITEMS) ×2 IMPLANT
PENCIL SMOKE EVAC W/HOLSTER (ELECTROSURGICAL) ×2 IMPLANT
RETAINER VISCERAL (MISCELLANEOUS) ×2 IMPLANT
RTRCTR C-SECT PINK 25CM LRG (MISCELLANEOUS) ×2 IMPLANT
SUT CHROMIC 1 CTX 36 (SUTURE) IMPLANT
SUT CHROMIC 2 0 CT 1 (SUTURE) ×2 IMPLANT
SUT MON AB 4-0 PS1 27 (SUTURE) ×2 IMPLANT
SUT PLAIN 1 NONE 54 (SUTURE) IMPLANT
SUT PLAIN 2 0 (SUTURE)
SUT PLAIN 2 0 XLH (SUTURE) ×2 IMPLANT
SUT PLAIN ABS 2-0 CT1 27XMFL (SUTURE) IMPLANT
SUT VIC AB 0 CTX 36 (SUTURE) ×1
SUT VIC AB 0 CTX36XBRD ANBCTRL (SUTURE) ×1 IMPLANT
SUT VIC AB 1 CTX 36 (SUTURE) ×2
SUT VIC AB 1 CTX36XBRD ANBCTRL (SUTURE) ×2 IMPLANT
TOWEL OR 17X24 6PK STRL BLUE (TOWEL DISPOSABLE) ×2 IMPLANT
TRAY FOLEY W/BAG SLVR 14FR LF (SET/KITS/TRAYS/PACK) ×2 IMPLANT
WATER STERILE IRR 1000ML POUR (IV SOLUTION) ×2 IMPLANT

## 2019-12-04 NOTE — MAU Provider Note (Signed)
History     CSN: [id number]  Arrival date and time: 12/04/19 5284   First Provider Initiated Contact with Patient 12/04/19 [phone]      Chief Complaint  Patient presents with  . Hypertension  . visual changes   HPI Cynthia Figueroa is a 34 y.o. G1P0000 at 83w5dwho presents to MAU for evaluation of elevated blood pressures on her home cuff. Patient endorses severe range BPs as well as recurrent scotomata over the weekend, now resolved. She arrived at work this morning, [place], and a blood pressure evaluation revealed BP reading 170s/90s.  On arrival to MAU she denies headache, visual disturbances, RUQ/epigastric pain, new onset swelling or weight gain. She denies vaginal bleeding, leaking of fluid, decreased fetal movement, fever, falls, or recent illness.   Patient's history is significant for severe recurrent vaginismus. She plans to have an epidural placed on arrival to L&D to allow her to tolerate cervical exam  She receives prenatal care with [facility].  OB History    Gravida  1   Para  0   Term  0   Preterm  0   AB  0   Living  0     SAB  0   TAB  0   Ectopic  0   Multiple  0   Live Births  0           Past Medical History:  Diagnosis Date  . Anxiety   . GERD (gastroesophageal reflux disease)   . Infertility, female   . PCOS (polycystic ovarian syndrome)   . Pre-diabetes   . 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 RMancel Bale(GYN)  . Vaginismus   . Vitamin D deficiency     No past surgical history on file.  Family History  Problem Relation Age of Onset  . High blood pressure Mother   . Thyroid disease Mother   . Obesity Mother   . AAA (abdominal aortic aneurysm) Father     Social History   Tobacco Use  . Smoking status: Never Smoker  . Smokeless tobacco: Never Used  Substance Use Topics  . Alcohol use: No  . Drug use: No    Allergies:  Allergies   Allergen Reactions  . Lamisil [Terbinafine] Hives    Medications Prior to Admission  Medication Sig Dispense Refill Last Dose  . Prenatal Vit-Fe Fumarate-FA (PRENATAL VITAMIN PO) Take 1 tablet by mouth daily.     . sertraline (ZOLOFT) 50 MG tablet Take 1.5 tablets (75 mg total) by mouth daily. NO REFILLS UNTIL OFFICE VISIT (Patient not taking: Reported on 07/27/2019) 45 tablet 0     Review of Systems  Eyes: Negative for visual disturbance.  Gastrointestinal: Negative for abdominal pain.  Musculoskeletal: Negative for back pain.  Neurological: Negative for headaches.  All other systems reviewed and are negative.  Physical Exam   Blood pressure (!) 179/105, pulse 66, temperature 99 F (37.2 C), temperature source Oral, resp. rate 18, height _0  (1.549 m), weight 95.7 kg, last menstrual period 03/08/2019, SpO2 100 %.  Physical Exam Vitals and nursing note reviewed. Exam conducted with a chaperone present.  Cardiovascular:     Rate and Rhythm: Normal rate.     Pulses: Normal pulses.     Heart sounds: Normal heart sounds.  Pulmonary:     Effort: Pulmonary effort is normal.     Breath sounds: Normal breath sounds.  Abdominal:     Comments: Gravid     MAU Course  Procedures  --Severe range BP #1 at work by Cardiology staff on credible machine. CNM met patient shortly after she was marked Triage Complete due to severe range BP #2 in MAU triage.  --Labetalol Protocol initiated --Patient placed on monitor, BPs initiated by CNM --Non-reactive NST. Baseline 135, mod variability, no accels, no decels --Toco: irregular contractions, palpate mild --Dr. Alesia Richards notified at [phone], agrees to admit. Clois Dupes, CNM notified at [phone], placing orders --Vertex position confirmed with BSUS  Patient Vitals for the past 24 hrs:  BP Temp Temp src Pulse Resp SpO2 Height Weight  12/04/19 1001 136/64 -- -- 65 -- -- -- --  12/04/19 0946 (!) 170/86 -- -- 60 -- -- -- --  12/04/19 0931 (!) 179/90 --  -- (!) 56 -- -- -- --  12/04/19 0916 (!) 173/90 -- -- 61 -- -- -- --  12/04/19 0902 (!) 179/105 -- -- 66 -- -- -- --  12/04/19 0839 (!) 190/94 99 F (37.2 C) Oral (!) 59 18 100 % _0  (1.549 m) 95.7 kg   Orders Placed This Encounter  Procedures  . Urinalysis, Routine w reflex microscopic Urine, Clean Catch  . Comprehensive metabolic panel  . CBC  . Protein / creatinine ratio, urine  . RPR  . Notify Physician  . Measure blood pressure  . Type and screen   Assessment and Plan  --34 y.o. G1P0000 at [redacted]w[redacted]d --Severe range blood pressure --Non-reactive tracing --SVE deferred  --Per Dr. KAlesia Richards admit to L&D  SDarlina Rumpf CNM 12/04/2019, 9:26 AM

## 2019-12-04 NOTE — Anesthesia Postprocedure Evaluation (Signed)
Anesthesia Post Note  Patient: Cynthia Figueroa  Procedure(s) Performed: CESAREAN SECTION (N/A )     Patient location during evaluation: PACU Anesthesia Type: Epidural Level of consciousness: oriented and awake and alert Pain management: pain level controlled Vital Signs Assessment: post-procedure vital signs reviewed and stable Respiratory status: spontaneous breathing, respiratory function stable and nonlabored ventilation Cardiovascular status: blood pressure returned to baseline and stable Postop Assessment: no headache, no backache, no apparent nausea or vomiting, epidural receding and patient able to bend at knees Anesthetic complications: no   No complications documented.  Last Vitals:  Vitals:   12/04/19 1900 12/04/19 1915  BP: (!) 147/84 (!) 152/82  Pulse: 83 71  Resp: 12 18  Temp: 36.8 C   SpO2: 92% 95%    Last Pain:  Vitals:   12/04/19 1900  TempSrc:   PainSc: 2    Pain Goal:                Epidural/Spinal Function Cutaneous sensation: Normal sensation (12/04/19 1900), Patient able to flex knees: Yes (12/04/19 1900), Patient able to lift hips off bed: Yes (12/04/19 1900), Back pain beyond tenderness at insertion site: No (12/04/19 1900), Progressively worsening motor and/or sensory loss: No (12/04/19 1900), Bowel and/or bladder incontinence post epidural: No (12/04/19 1900)  Cynthia Figueroa A.

## 2019-12-04 NOTE — H&P (Addendum)
OB ADMISSION/ HISTORY & PHYSICAL:  Admission Date: 12/04/2019  8:25 AM  Admit Diagnosis: PIH  Cynthia Figueroa is a 34 y.o. female G1P0000 [redacted]w[redacted]d IVF pregnancy presenting for elevated BP taken at work this morning and "seeing spots". Pt states that BP was elevated in the severe range over the weekend w/ home cuff. Pt went to work this morning and was told that she "looks sick". BP was taken at work and noted in the severe range per pt. BP 190/94 on arrival to MAU. Treated w/ Labetalol in MAU. Endorses active FM, denies ctx, LOF, and vaginal bleeding.   History of current pregnancy: G1P0000 IVF pregnancy  Patient entered care with [facility] at 15+6 wks.   EDC of 12/13/19 was established by LMP and congruent w/ 11+4 wk U/S.   Anatomy scan:  completed at 24 wks, with normal findings and posteriuor placenta.   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   PCOS (polycystic ovarian syndrome) 03/08/2013    Prenatal Labs: ABO, Rh: --/--/O POS (08/23 2094) Antibody: NEG (08/23 0905) Rubella:   immune RPR:   NR HBsAg:   NR HIV:   NR GTT: passed 1 hr GBS:   neg GC/CHL: neg/neg Genetics: Low-risk female, AFP normal, Horizon normal Tdap/influenza vaccines: tdap this preg   OB History  Gravida Para Term Preterm AB Living  1 0 0 0 0 0  SAB TAB Ectopic Multiple Live Births  0 0 0 0 0    # Outcome Date GA Lbr Len/2nd Weight Sex Delivery Anes PTL Lv  1 Current             Medical / Surgical History: Past medical history:  Past Medical History:  Diagnosis Date   Anxiety    GERD (gastroesophageal reflux disease)    Infertility, female    PCOS (polycystic  ovarian syndrome)    Pre-diabetes    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 Su Hilt (GYN)   Vaginismus    Vitamin D deficiency     Past surgical history: No past surgical history on file. Family History:  Family History  Problem Relation Age of Onset   High blood pressure Mother    Thyroid disease Mother    Obesity Mother    AAA (abdominal aortic aneurysm) Father     Social History:  reports that she has never smoked. She has never used smokeless tobacco. She reports that she does not drink alcohol and does not use drugs.  Allergies: Lamisil [terbinafine]   Current Medications at time of admission:  Prior to Admission medications   Medication Sig Start Date End Date Taking? Authorizing Provider  Prenatal Vit-Fe Fumarate-FA (PRENATAL VITAMIN PO) Take 1 tablet by mouth daily.    [provider]  sertraline (ZOLOFT) 50 MG tablet Take 1.5 tablets (75 mg total) by mouth daily. NO REFILLS UNTIL OFFICE VISIT Patient not taking: Reported on 07/27/2019 06/20/19   Arnette Felts, FNP    Review of Systems: Constitutional: Negative   HENT: Negative   Eyes: Seeing spots   Respiratory: Negative   Cardiovascular: Negative   Gastrointestinal: Negative  Genitourinary: neg for bloody show, neg for LOF  Musculoskeletal: Negative   Skin: Negative   Neurological: Negative   Endo/Heme/Allergies: Negative   Psychiatric/Behavioral: Negative    Physical Exam: VS: Blood pressure (!) 179/105, pulse 66, temperature 99 F (37.2 C), temperature source Oral, resp. rate 18, height 5\' 1"  (1.549 m), weight 95.7 kg, last menstrual period 03/08/2019, SpO2 100 %. AAO x3, no signs of distress Nuero: 2+ DTR, neg clonus Cardiovascular: RRR Respiratory: Lung fields clear to ausculation GU/GI: Abdomen gravid, non-tender, non-distended, active FM Extremities: 1+ non-pitting edema, negative for pain, tenderness, and cords  Cervical exam: deferred, pt cannot  tolerate SVE FHR: baseline rate 145 / variability moderate / accelerations present / absent decelerations TOCO: occasional ctx   Prenatal Transfer Tool  Maternal Diabetes: No Genetic Screening: Normal Maternal Ultrasounds/Referrals: Normal Fetal Ultrasounds or other Referrals:  None Maternal Substance Abuse:  No Significant Maternal Medications:  None Significant Maternal Lab Results: Group B Strep negative    Assessment: 34 y.o. G1P0000 [redacted]w[redacted]d PIH IVF preg Vaginismus-severe    -vaginal exam after early epidural Hx of depression and anxiety    -stopped sertraline before conception   FHR category 1 GBS neg Pain management plan: early epidural   Plan:  Admit to L&D Routine admission orders Epidural after admission  Dr [redacted]w[redacted]d notified of admission and plan of care  Sallye Ober MSN, CNM 12/04/2019 9:27 AM

## 2019-12-04 NOTE — Anesthesia Preprocedure Evaluation (Signed)
Anesthesia Evaluation  Patient identified by MRN, date of birth, ID band Patient awake    Reviewed: Allergy & Precautions, H&P , NPO status , Patient's Chart, lab work & pertinent test results, reviewed documented beta blocker date and time   Airway Mallampati: II  TM Distance: >3 FB Neck ROM: full    Dental no notable dental hx. (+) Teeth Intact, Dental Advisory Given   Pulmonary neg pulmonary ROS,    Pulmonary exam normal breath sounds clear to auscultation       Cardiovascular hypertension, Pt. on medications Normal cardiovascular exam Rhythm:regular Rate:Normal     Neuro/Psych Anxiety negative neurological ROS     GI/Hepatic Neg liver ROS, GERD  Medicated,  Endo/Other  diabetes, Gestational  Renal/GU negative Renal ROS  negative genitourinary   Musculoskeletal   Abdominal (+) + obese,   Peds  Hematology  (+) Blood dyscrasia, anemia ,   Anesthesia Other Findings   Reproductive/Obstetrics (+) Pregnancy                             Anesthesia Physical Anesthesia Plan  ASA: III  Anesthesia Plan: Epidural   Post-op Pain Management:    Induction:   PONV Risk Score and Plan:   Airway Management Planned: Natural Airway  Additional Equipment:   Intra-op Plan:   Post-operative Plan:   Informed Consent: I have reviewed the patients History and Physical, chart, labs and discussed the procedure including the risks, benefits and alternatives for the proposed anesthesia with the patient or authorized representative who has indicated his/her understanding and acceptance.       Plan Discussed with: Anesthesiologist and CRNA  Anesthesia Plan Comments:         Anesthesia Quick Evaluation

## 2019-12-04 NOTE — MAU Note (Addendum)
Over the past wkd, her BP was elevated (systolic140-160, today was172/96). Since Thursday, had noted an increase in swelling in lower legs. Denies HA or epigastric pain, reports seeing spots.

## 2019-12-04 NOTE — Progress Notes (Addendum)
Subjective:    Called to bedside for prolonged variable, FHR recovered w/ position change. Second prolonged noted while CNM and RN present, FHR ascending to 130's w/ position change. Epidural effective for pain management, but pt reports VE as uncomfortable and cannot tolerate me initiating the exam or the bladder cath placement.   Objective:    VS: BP (!) 147/69   Pulse 65   Temp 99 F (37.2 C) (Oral)   Resp 16   Ht 5\' 1"  (1.549 m)   Wt 95.7 kg   LMP 03/08/2019   SpO2 98%   BMI 39.85 kg/m  FHR : baseline 140 / variability moderate / accelerations absent / prolonged decelerations Toco: none Membranes: intact     Assessment/Plan:   34 y.o. G1P0000 [redacted]w[redacted]d  PIH IVF preg Vaginismus-severe -intolerable of vaginal exams Hx of depression and anxiety -stopped sertraline before conception  Labor: not in labor Preeclampsia:  no signs or symptoms of toxicity and labs stable Fetal Wellbeing:  Category II Pain Control:  Epidural I/D:  GBS negative Anticipated MOD:  pt requesting a primary C/S   Will notify Dr. [redacted]w[redacted]d of my assessment of pt's request.   Sallye Ober MSN, CNM 12/04/2019 4:10 PM

## 2019-12-04 NOTE — Transfer of Care (Signed)
Immediate Anesthesia Transfer of Care Note  Patient: Cynthia Figueroa  Procedure(s) Performed: CESAREAN SECTION (N/A )  Patient Location: PACU  Anesthesia Type:Epidural  Level of Consciousness: awake, alert  and patient cooperative  Airway & Oxygen Therapy: Patient Spontanous Breathing  Post-op Assessment: Report given to RN, Post -op Vital signs reviewed and stable and Patient moving all extremities X 4  Post vital signs: Reviewed and stable  Last Vitals:  Vitals Value Taken Time  BP    Temp    Pulse    Resp    SpO2      Last Pain:  Vitals:   12/04/19 1306  TempSrc:   PainSc: 3          Complications: No complications documented.

## 2019-12-04 NOTE — Anesthesia Procedure Notes (Signed)
Epidural Patient location during procedure: OB Start time: 12/04/2019 12:31 PM End time: 12/04/2019 12:36 PM  Staffing Anesthesiologist: Bethena Midget, MD  Preanesthetic Checklist Completed: patient identified, IV checked, site marked, risks and benefits discussed, surgical consent, monitors and equipment checked, pre-op evaluation and timeout performed  Epidural Patient position: sitting Prep: DuraPrep and site prepped and draped Patient monitoring: continuous pulse ox and blood pressure Approach: midline Location: L2-L3 Injection technique: LOR air  Needle:  Needle type: Tuohy  Needle gauge: 17 G Needle length: 9 cm and 9 Needle insertion depth: 9 cm Catheter type: closed end flexible Catheter size: 19 Gauge Catheter at skin depth: 15 cm Test dose: negative  Assessment Events: blood not aspirated, injection not painful, no injection resistance, no paresthesia and negative IV test

## 2019-12-04 NOTE — Progress Notes (Addendum)
Cynthia Figueroa is a 34 y.o. G1P0000 at [redacted]w[redacted]d admitted forPIH. IVF IUP and pt w/ severe vaginismus  Subjective:  Discussed plan of care. Pt desires early epidural to tolerate vaginal exams and a trial of a vaginal birth. Pt c/o mild HA and continues to see spots occasionally.   Objective: BP (!) 147/69   Pulse 65   Temp 99 F (37.2 C) (Oral)   Resp 16   Ht 5\' 1"  (1.549 m)   Wt 95.7 kg   LMP 03/08/2019   SpO2 98%   BMI 39.85 kg/m  No intake/output data recorded. No intake/output data recorded.  FHT:  FHR: 145 bpm, variability: moderate,  accelerations:  Abscent,  decelerations:  Absent UC:   none SVE:    deferred until after epidural  Labs: Lab Results  Component Value Date   WBC 12.8 (H) 12/04/2019   HGB 9.4 (L) 12/04/2019   HCT 31.1 (L) 12/04/2019   MCV 92.3 12/04/2019   PLT 266 12/04/2019   CMP     Component Value Date/Time   NA 136 12/04/2019 0935   NA 144 01/18/2019 1214   K 4.1 12/04/2019 0935   CL 106 12/04/2019 0935   CO2 19 (L) 12/04/2019 0935   GLUCOSE 78 12/04/2019 0935   BUN 7 12/04/2019 0935   BUN 10 01/18/2019 1214   CREATININE 0.87 12/04/2019 0935   CALCIUM 8.6 (L) 12/04/2019 0935   PROT 5.6 (L) 12/04/2019 0935   PROT 7.1 01/18/2019 1214   ALBUMIN 2.4 (L) 12/04/2019 0935   ALBUMIN 4.4 01/18/2019 1214   AST 32 12/04/2019 0935   ALT 14 12/04/2019 0935   ALKPHOS 157 (H) 12/04/2019 0935   BILITOT 0.8 12/04/2019 0935   BILITOT 0.2 01/18/2019 1214   GFRNONAA >60 12/04/2019 0935   GFRAA >60 12/04/2019 0935   PCR 0.26 Assessment / Plan: PIH IVF preg Vaginismus-severe    -vaginal exam after early epidural Hx of depression and anxiety    -stopped sertraline before conception  Labor: Plan for oral Cytotec after epidural and VE Fetal Wellbeing:  Category I Pain Control:  Epidural I/D:  GBS negative Anticipated MOD:  hopeful for NSVD   Will discuss plan w/ Dr. 12/06/2019 MSN, CNM 12/04/2019, 4:02 PM

## 2019-12-04 NOTE — Op Note (Addendum)
Patient: Cynthia Figueroa, Cynthia Figueroa DOB: 1985/07/07 MRN:  [id number]    DATE OF SURGERY: 12/04/2919  PREOP DIAGNOSIS:  1. 38 week 5 day EGA intrauterine pregnancy. 2. Pregnancy induced hypertension with severe range blood pressures requiring IV antihypertensives.  3. Severe vaginismus.  4. Abnormal fetal heart tracing with prolonged fetal decelerations. 5. Remote from delivery.   6. IVF pregnancy.   POSTOP DIAGNOSIS: Same as above.  PROCEDURE: Urgent Primary low uterine segment transverse cesarean section via Pfannenstiel incision.     SURGEON: Dr.  Hoover Browns  ASSISTANT: None  ANESTHESIA: Bolused epidural.   COMPLICATIONS: None  FINDINGS: Viable female infant in cephalic presentation, DOP, weight 6 lbs 3.5 oz, Apgar scores of 7, 7 and 9, nuchal cord noted. Normal uterus and fallopian tubes and ovaries bilaterally.    EBL:  354 cc  IV FLUID:  Per anesthesia   URINE OUTPUT: See anesthesia charting.   INDICATIONS:  34 y/o P0 who presented with a chief complaint of elevated blood pressures in the severe range at work and in the hospital requiring IV antihypertensives.  She was admitted for induction of labor.  She received an early epidural due to her history of severe vaginismus.  Before induction could be started or patient examined cervically, the fetus was noted to have prolonged decelerations on the monitor.  She was not contracting regularly on the monitor and was likely was remote from delivery.  Earlier before the decelerations the patient had also expressed a desire for a cesarean delivery because of her vaginismus.  For all these reasons a cesarean section was recommended and accepted by the patient.  She was consented for the procedure after explaining risks benefits and alternatives of the procedure including risks of bleeding, infection and damage to organs.      PROCEDURE:  Informed consent was obtained from the patient to undergo the procedure. She was taken to the operating room  where her anesthesia was found to be adequate. She was prepped and draped in the usual sterile fashion and a Foley catheter was placed. She received 2 g of IV Ancef preoperatively. A Pfannenstiel incision was made with the scalpel and the incision extended through the subcutaneous layer and also the fascia with the bovie. Small perforators in the subcutaneous layer were contained with the Bovie. The fascia was nicked in the midline and then was further separated from the rectus muscles bilaterally using Mayo scissors. Kochers were placed inferiorly and then superiorly to allow further separation of fascia from the rectus muscles.  The peritoneal cavity was entered bluntly with the fingers. The Alexis retractor was placed in. The bladder flap was created using Metzenbaum scissors.  The uterus was incised with a scalpel and the incision extended bluntly bilaterally with fingers and bandage scisors.  Membranes were ruptured and moderate clear amniotic fluid was noted.  The head then the rest of the body was then delivered with abdominal pressure and terminal meconium noted.  She delivered a viable female infant, apgar scores as above.  The edges of the uterus was grasped with T clamps.The cord was clamped and cut after 1 minute. Cord blood was collected.  The uterus was not exteriorized.  The placenta was delivered with gentle traction on the umbilical cord.  The uterus was cleared of clots and debris with a lap.  The uterine incision was closed with #1 Vicryl in a running locked stitch. An imbricating layer of the same stitch was placed over the initial closure.  Irrigation was applied and  suctioned out. Excellent hemostasis was noted over the incision.  The muscles and peritoneum were then reapproximated using chromic suture.  Fascia was closed using 0 Vicryl in a running stitch. The subcutaneous layer was irrigated and suctioned out. Small perforators were contained with the bovie.  The subcutaneous layer was closed  using 1-0 plain in interrupted stitches. The skin was closed using 4-0 Vicryl on the [place] needle. Benzocaine and steri strips were applied.  Honeycomb was then applied. The patient was then cleaned and she was taken to the recovery room with her baby in stable conditions.  SPECIMEN: Placenta to pathology, umbilical cord blood to lab.  DISPOSITION: Baby to NICU in stable condition,  Mother to PACU in stable condition.   Dr. Sallye Ober.   12/06/2019.

## 2019-12-04 NOTE — Progress Notes (Addendum)
Called to bedside for prolonged decel.   S: Pt in hands and knees position. Dr. Sallye Ober present, informed consent for primary C/S for [facility] discussed and pt agrees. Pt prepped for OR.   O: FHR: baseline 135 / variability moderate / accelerations absent / prolonged deceleration Toco: none Membranes: intact   Assessment/Plan:   34 y.o. G1P0000 [redacted]w[redacted]d Primary C/S for NRFHR  PIH    -severe range BP treated x1 IVF preg Vaginismus-severe -intolerable of vaginal exams Hx of depression and anxiety -stopped sertraline before conception  Labor: not in labor Preeclampsia:  no signs or symptoms of toxicity and labs stable Fetal Wellbeing:  Category II Pain Control:  Epidural I/D:  GBS negative Anticipated MOD:  C/S  Dr. Sallye Ober present and aware.   Rhea Pink, MSN, CNM 12/04/2019 8:12 PM

## 2019-12-04 NOTE — Lactation Note (Signed)
This note was copied from a baby's chart. Lactation Consultation Note  Patient Name: Cynthia Figueroa [id number] Date: 12/04/2019 Reason for consult: Initial assessment;Early term 37-38.6wks;NICU baby;Primapara;1st time breastfeeding  Visited with mom of a 5 hours old ETI NICU female, she's a P1 and reported (+) breast changes during the pregnancy. She's a [employer] employee and already got her DEBP in the mail. Mom is not familiar with hand expression but when LC offered, she politely declined, she was in pain, her abdomen was hurting, RN Tiara notified.  Offered to set up a DEBP but mom was too tired, she asked if she can start pumping in the morning, LC agreed and asked her to take her time and let her RN know whenever she's ready to start pumping. Reviewed pumping schedule, benefits of hand expression/breast massage prior pumping and supply and demand.  Feeding plan:  1. Encouraged mom to start pumping tomorrow (if possible), she'll let her RN know when she's ready 2. Once she starts pumping, she'll aim for 8 pumping sessions in 24 hours; around every third hour and will save any amount of EBM she may get to be taken to the NICU  BF brochure, BF resources and NICU booklet were reviewed. FOB present in the room during [facility] consultation. Parents reported all questions and concerns were answered, they're both aware of LC OP services and will call PRN.   Maternal Data Formula Feeding for Exclusion: No Has patient been taught Hand Expression?: No Does the patient have breastfeeding experience prior to this delivery?: No  Feeding Feeding Type: Donor Milk with Formula  LATCH Score                   Interventions Interventions: Breast feeding basics reviewed  Lactation Tools Discussed/Used WIC Program: No   Consult Status Consult Status: Follow-up Date: 12/05/19 Follow-up type: In-patient    Mike Berntsen Venetia Constable 12/04/2019, 10:42 PM

## 2019-12-05 ENCOUNTER — Encounter (HOSPITAL_COMMUNITY): Payer: Self-pay | Admitting: Obstetrics & Gynecology

## 2019-12-05 DIAGNOSIS — D62 Acute posthemorrhagic anemia: Secondary | ICD-10-CM | POA: Diagnosis present

## 2019-12-05 LAB — CBC
HCT: 23.3 % — ABNORMAL LOW (ref 36.0–46.0)
Hemoglobin: 7.2 g/dL — ABNORMAL LOW (ref 12.0–15.0)
MCH: 28.3 pg (ref 26.0–34.0)
MCHC: 30.9 g/dL (ref 30.0–36.0)
MCV: 91.7 fL (ref 80.0–100.0)
Platelets: 238 10*3/uL (ref 150–400)
RBC: 2.54 MIL/uL — ABNORMAL LOW (ref 3.87–5.11)
RDW: 13.9 % (ref 11.5–15.5)
WBC: 11 10*3/uL — ABNORMAL HIGH (ref 4.0–10.5)
nRBC: 0 % (ref 0.0–0.2)

## 2019-12-05 LAB — RPR: RPR Ser Ql: NONREACTIVE

## 2019-12-05 MED ORDER — SODIUM CHLORIDE 0.9 % IV SOLN
510.0000 mg | Freq: Once | INTRAVENOUS | Status: AC
  Administered 2019-12-05: 510 mg via INTRAVENOUS
  Filled 2019-12-05: qty 17

## 2019-12-05 MED ORDER — POLYSACCHARIDE IRON COMPLEX 150 MG PO CAPS
150.0000 mg | ORAL_CAPSULE | Freq: Every day | ORAL | Status: DC
  Administered 2019-12-06 – 2019-12-07 (×2): 150 mg via ORAL
  Filled 2019-12-05 (×2): qty 1

## 2019-12-05 MED ORDER — NIFEDIPINE ER OSMOTIC RELEASE 30 MG PO TB24
30.0000 mg | ORAL_TABLET | Freq: Every day | ORAL | Status: DC
  Administered 2019-12-05 – 2019-12-07 (×3): 30 mg via ORAL
  Filled 2019-12-05 (×3): qty 1

## 2019-12-05 NOTE — Progress Notes (Signed)
Cynthia Figueroa [id number] Postpartum Postoperative Day # 1  Cynthia Figueroa, G1P1001, [redacted]w[redacted]d, S/P Primary LT Cesarean Section due to Fetal intolerance remote from delivery. Pt was admitted on 8/23 @ 38.5 for GHTN IOL, pt had severe range BP in 170s/80-90s, tx with 3 doses IV labetalol with vision changes, Pt denies vision changes RUQ pain or HA, admission labs were unremarkable with no repeat required, PCR was 0.26, PP to still had elevated BP 140/80s today and started on PO procardia 30XL mg daily. During induction process pt unable to tolerate vaginal exams due to severe vaginismus, therefore no Augmentin utilized. Pregnancy was IVF. H/O Anxiety and depression, mood stable, no meds. Pt hgb drop from 8.2-7.2 with surgical QBL of , pt has symptomatic anemia with dizziness upon movement. Pt verbalized consent for iron transfusion after R/B/A reviewed. Baby Female in NICU for TTNP, plans to return to room today, plan for in pt circ tomorrow.   Subjective: Patient up ad lib, denies syncope. Endorses dizziness upon moving. Reports consuming regular diet without issues and denies N/V. Patient reports 0 bowel movement + passing flatus.  Denies issues with urination, but urine still concentrated, pt not drinking much, foley still in place until diuresing, pt encouraged to drink fluids and reports bleeding is "lighter."  Patient is breatfeeding and reports going well.  Desires undecided for postpartum contraception.  Pain is being appropriately managed with use of po meds. Denies vision changes RUQ pain or HA. Mood tired but happy, denies SI/HI.    Objective: Patient Vitals for the past 24 hrs:  BP Temp Temp src Pulse Resp SpO2  12/05/19 0426 -- -- -- -- -- 100 %  12/05/19 0100 (!) 149/83 98.2 F (36.8 C) Oral 60 16 100 %  12/04/19 2300 (!) 141/78 98.3 F (36.8 C) Oral 64 15 100 %  12/04/19 2200 (!) 156/78 98.4 F (36.9 C) Oral 63 16 100 %  12/04/19 2100 (!) 144/82 98.3 F (36.8 C) Oral 63 17 100 %   12/04/19 2000 140/81 98.2 F (36.8 C) Oral 64 16 98 %  12/04/19 1915 (!) 152/82 -- -- 71 18 95 %  12/04/19 1900 (!) 147/84 98.3 F (36.8 C) -- 83 12 92 %  12/04/19 1845 (!) 144/98 -- -- 72 16 91 %  12/04/19 1830 (!) 156/86 -- -- 70 14 92 %  12/04/19 1815 135/70 -- -- 70 20 94 %  12/04/19 1800 (!) 153/80 98.8 F (37.1 C) Oral 72 18 --  12/04/19 1757 (!) 149/90 -- -- 74 18 --  12/04/19 1432 (!) 147/69 -- -- 65 16 --  12/04/19 1401 (!) 148/70 -- -- 66 16 98 %  12/04/19 1331 (!) 145/82 -- -- 66 16 --  12/04/19 1304 (!) 165/86 -- -- 63 16 100 %  12/04/19 1302 (!) 164/84 -- -- 63 16 --  12/04/19 1256 (!) 163/77 -- -- 65 16 --  12/04/19 1252 (!) 142/74 -- -- 71 16 --  12/04/19 1251 -- -- -- -- -- 100 %  12/04/19 1241 (!) 145/76 -- -- 68 16 99 %  12/04/19 1236 (!) 148/75 -- -- 65 16 100 %  12/04/19 1234 (!) 147/75 99 F (37.2 C) Oral 68 16 --  12/04/19 1231 (!) 152/83 -- -- 69 18 99 %  12/04/19 1209 (!) 159/84 -- -- 63 16 --  12/04/19 1033 (!) 159/88 -- -- 62 16 --  12/04/19 1001 136/64 -- -- 65 -- --  12/04/19 0946 (!) 170/86 -- -- 60 -- --  12/04/19 0931 (!) 179/90 -- -- (!) 56 -- --  12/04/19 0916 (!) 173/90 -- -- 61 -- --  12/04/19 0902 (!) 179/105 -- -- 66 -- --     Physical Exam:  General: alert, cooperative, appears stated age and no distress Mood/Affect: Happy, fatigued Lungs: clear to auscultation, no wheezes, rales or rhonchi, symmetric air entry.  Heart: normal rate, regular rhythm, normal S1, S2, no murmurs, rubs, clicks or gallops. Breast: breasts appear normal, no suspicious masses, no skin or nipple changes or axillary nodes. Abdomen:  + bowel sounds, soft, non-tender Incision: healing well, no significant drainage, no dehiscence, no significant erythema, Honeycomb dressing  Uterine Fundus: firm, involution -1 Lochia: appropriate Skin: Warm, Dry. DVT Evaluation: No evidence of DVT seen on physical exam. Negative Homan's sign. No cords or calf tenderness. No  significant calf/ankle edema. NO CLonus  Labs: Recent Labs    12/04/19 0935 12/04/19 2013 12/05/19 0555  HGB 9.4* 8.2* 7.2*  HCT 31.1* 25.8* 23.3*  WBC 12.8* 13.7* 11.0*    CBG (last 3)  No results for input(s): GLUCAP in the last 72 hours.   I/O: I/O last 3 completed shifts: In: 2000 [I.V.:2000] Out: 1304 [Urine:950; Blood:354]   Assessment Postpartum Postoperative Day # 1  Cynthia Figueroa, G1P1001, [redacted]w[redacted]d, S/P Primary LT Cesarean Section due to Fetal intolerance remote from delivery. Pt was admitted on 8/23 @ 38.5 for GHTN IOL, pt had severe range BP in 170s/80-90s, tx with 3 doses IV labetalol with vision changes, Pt denies vision changes RUQ pain or HA, admission labs were unremarkable with no repeat required, PCR was 0.26, PP to still had elevated BP 140/80s today and started on PO procardia 30XL mg daily. During induction process pt unable to tolerate vaginal exams due to severe vaginismus, therefore no Augmentin utilized. Pregnancy was IVF. H/O Anxiety and depression, mood stable, no meds. Pt hgb drop from 8.2-7.2 with surgical QBL of , pt has symptomatic anemia with dizziness upon movement. Pt verbalized consent for iron transfusion after R/B/A reviewed. Baby Female in NICU for TTNP, plans to return to room today, plan for in pt circ tomorrow.  Pt stable. -1 Involution. BreastFeeding. Hemodynamically Stable.  Plan: Continue other mgmt as ordered H/O Depression/Anxiety: Monitor mood, EPDS prior to discharge.  GHTN:IV labetalol protocol if needed, po procardia started to 30mg  XL daily, monitor BP and urine output.  Anemia: Iron transfusion ordered today. PO Iron.  Baby Female: Transfer back to room today possibly, plan for circ tomorrow.  VTE Prophylactics: SCD, ambulated as tolerates.  Pain control: Motrin/Tylenol/Narcotics PRN Education given regarding options for contraception, including barrier methods, injectable contraception, IUD placement, oral contraceptives.  Plan  for discharge tomorrow or POD#3, Breastfeeding, Lactation consult, Circumcision prior to discharge and Contraception undeicded  Dr. to be updated on patient status  [facility] NP-C, CNM 12/05/2019, 8:49 AM

## 2019-12-05 NOTE — Progress Notes (Signed)
Patient was ready to return to [facility] at 3:00 pm.  Was transported via wheelchair, then patient was able to ambulate to and from the bathroom for foley removal.  Tolerated well.  Advised to get staff assist to get up the first time to void, then prn dizziness or weakness.  Delice Bison Haylyn Halberg Charity fundraiser

## 2019-12-05 NOTE — Lactation Note (Signed)
This note was copied from a baby's chart. Lactation Consultation Note  Patient Name: Cynthia Figueroa [id number] Date: 12/05/2019  [name] Vicente Serene asleep in dads arms now 28 hours old.  [name] Vicente Serene inittially transferred to NICU but back in moms room at this time.. Mom reports he isn't latching well. Mom reports she just got out of the shower and wants to eat. Mom reports she was told she had inverted nipples. Did not observe breasts or nipples at this time.  Urged mom to try and pump everytime a bottle is given and to do some massage and hand expression prior to pumping with coconut oil to soften the areolar nipple complex.  Urged mom to call lactation when infant starts cuing to assist with feeding. Praised breastfeeding efforts.     Maternal Data    Feeding Feeding Type: Bottle Fed - Formula Nipple Type: Slow - flow  LATCH Score                   Interventions    Lactation Tools Discussed/Used     Consult Status      Jalicia Roszak [name] 12/05/2019, 8:58 PM

## 2019-12-05 NOTE — Discharge Instructions (Signed)
Ferumoxytol injection

## 2019-12-05 NOTE — Progress Notes (Signed)
At 1230 patient was taken via wheelchair to NICU to visit her infant.  Patient's mother accompanied the patient and RN.  They were to have NICU staff call if the patient felt tired or dizzy, or wanted to return to the unit.  At 1400 the patient's mother returned to the room but stated the patient is still with her infant and is doing well physically.  Delice Bison Tashon Capp Charity fundraiser

## 2019-12-05 NOTE — Progress Notes (Signed)
CSW is screening out consult due to infant transitioning back to [facility].   MOB was referred for history of depression/anxiety. * Referral screened out by Clinical Social Worker because none of the following criteria appear to apply: ~ History of anxiety/depression during this pregnancy, or of post-partum depression following prior delivery. ~ Diagnosis of anxiety and/or depression within last 3 years OR * MOB's symptoms currently being treated with medication and/or therapy. MOB has an active Rx for Zoloft.   Please contact the Clinical Social Worker if needs arise, by [facility] request, or if MOB scores greater than 9/yes to question 10 on [place] Postpartum Depression Screen.  Blaine Hamper, MSW, LCSW Clinical Social Work [phone]

## 2019-12-06 LAB — URINALYSIS, ROUTINE W REFLEX MICROSCOPIC
Bacteria, UA: NONE SEEN
Bilirubin Urine: NEGATIVE
Glucose, UA: NEGATIVE mg/dL
Ketones, ur: NEGATIVE mg/dL
Leukocytes,Ua: NEGATIVE
Nitrite: NEGATIVE
Protein, ur: NEGATIVE mg/dL
Specific Gravity, Urine: 1.008 (ref 1.005–1.030)
pH: 8 (ref 5.0–8.0)

## 2019-12-06 LAB — SURGICAL PATHOLOGY

## 2019-12-06 NOTE — Lactation Note (Signed)
This note was copied from a baby's chart. Lactation Consultation Note  Patient Name: Cynthia Figueroa [id number] Date: 12/06/2019  Telephone call from RN that mom wants to see lactation.  Entered room and infant just fed formula and not cuing. Mom reports she has tried to latch him a few times and can't get him to latch.  Mom reports she wants to make sure she is using the DEBP right.  Still not getting anything.  Asked mom if anyone had taught her hand expression and she reported no.  Assist with hand expression.  After a few attempts mom able to remove small drops of colostrum.  Mom happy to see them.  Assisted with pumping.  Measured moms nipples.  Used 21 mm flange on left and 24 mm flange on right.  Mom reports pumping comfortable.  Explained to mom she may move up to need larger flanges with pumping. Moms nipples go in when breasts shaping. Discussed possible nipple shield usage.  Discussed sometimes its an easier transition for a baby who has been taking bottles. Mom repprts she has one.  Mom has one she purchased on [employer].  Discouraged her from using it.  Gave her a 20 mm Medela nipple shield and instructed her how to use it.  Urged mom to call lactation as soon as infant started to cue.   Stayed duration of pump cycle.  Discussed changing pump to move to uncomfortable and back down to comfortable for maxmum stimulation.   Maternal Data    Feeding    LATCH Score                   Interventions    Lactation Tools Discussed/Used     Consult Status      Cynthia Figueroa Cynthia Figueroa 12/06/2019, 9:01 PM

## 2019-12-06 NOTE — Progress Notes (Addendum)
Subjective: POD# 2 Information for the patient's newborn:  Cynthia Figueroa, Cynthia Figueroa [[id number]]  female    Baby's Name Vicente Serene Circumcision completed 12/06/19 Dr. Sallye Ober  Reports feeling very tired, desires to stay for 72 hrs post-op  Denies HA, visual changes, and epigastric pain Feeding: bottle, desires to breastfeed, needs support  Reports tolerating PO and denies N/V, foley removed, ambulating and urinating w/o difficulty  Pain controlled with PO meds Denies HA/SOB/dizziness  Flatus passing, has had a BM since delivery Vaginal bleeding is normal, no clots     Objective:  VS:  Vitals:   12/05/19 1515 12/05/19 2100 12/06/19 0232 12/06/19 0508  BP: 127/79 (!) 145/87 131/75 (!) 132/95  Pulse: 71 80 78 83  Resp: 18 20  20   Temp: 98.7 F (37.1 C) 98 F (36.7 C)  98.4 F (36.9 C)  TempSrc:  Oral  Oral  SpO2: 100% 100%  100%  Weight:      Height:        Intake/Output Summary (Last 24 hours) at 12/06/2019 1134 Last data filed at 12/05/2019 1720 Gross per 24 hour  Intake 677.42 ml  Output 1150 ml  Net -472.58 ml     Recent Labs    12/04/19 2013 12/05/19 0555  WBC 13.7* 11.0*  HGB 8.2* 7.2*  HCT 25.8* 23.3*  PLT 252 238    Blood type: --/--/O POS (08/23 0905) Rubella:   immune   Physical Exam:  General: alert, cooperative and no distress CV: Regular rate and rhythm or without murmur or extra heart sounds Resp: clear, unlabored Abdomen: soft, nontender, normal bowel sounds Incision: dressing clean, dry, intact Perineum:  Uterine Fundus: firm, below umbilicus, nontender Lochia: appropriate Ext: 1+ non-pitting edema, neg for pain, tenderness, and cords   Assessment/Plan: 34 y.o.   POD# 2. 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

## 2019-12-06 NOTE — Lactation Note (Signed)
This note was copied from a baby's chart. Lactation Consultation Note  Patient Name: Cynthia Figueroa [id number] Date: 12/06/2019 Reason for consult: Follow-up assessment [name] Cynthia Figueroa now 67 hours old. Mom reports she wants to take a shower.  Asked mom if she had any questions/concerns regarding lactation.  Mom reports she may want LC to come back later.  Urged her to let RN know if she wanted to be seen later.   Maternal Data    Feeding    LATCH Score                   Interventions Interventions: Breast feeding basics reviewed  Lactation Tools Discussed/Used     Consult Status Consult Status: Follow-up Date: 12/07/19 Follow-up type: In-patient    Cynthia Figueroa 12/06/2019, 7:10 PM

## 2019-12-06 NOTE — Lactation Note (Signed)
This note was copied from a baby's chart. Lactation Consultation Note  Patient Name: Cynthia Figueroa [id number] Date: 12/06/2019 Reason for consult: Follow-up assessment;Mother's request;Difficult latch;1st time breastfeeding Parents report he is cueing and want assistance with latching him first time.  Assist with latching him on moms right breast in cross cradle hold with 20 mm nipple shield.Showed mom how to hand express prior to using shield and to stretch shield and turn it inside out.   Discussed adding supplement at the breast while infant transitioning to breastfeeding.  Infant fell asleep and came off.  Assist with latching infant on left breast in football hold.  Showed parents how to use curved tip syringe and syringe and five french feeding tube to give him supplement at he breast.  Discussed that since he is now at the breast that mom can slightly lower the amount of supplement given.  However, if he is still hungry to go ahead and give him more.  Mom has been giving 30 ml per feed.  At breast LC supplemented him with 24 ml however he only took 22 and fell asleep at breast.  However when mom moved him he started rooting again.  Urged her to put him back on the breast and if not to offer the rest of supplement.  Urged to offer the breast on cues and only give supplement at the 2-3 hour feed.Mom reports he is waking up to feed at two to three hours. Urged parents to use whatever method they felt comfortable with. Urged mom to continue to pump every 2-3 hours as well post breastfeedings/and/or every time Supplement given. Urged parents to call lactation as needed.  Maternal Data Has patient been taught Hand Expression?: No  Feeding    LATCH Score                   Interventions Interventions: Breast feeding basics reviewed;Breast massage;Hand express;Coconut oil;DEBP  Lactation Tools Discussed/Used     Consult Status Consult Status: Follow-up Date: 12/06/19 Follow-up  type: In-patient    [facility] [name] 12/06/2019, 11:25 PM

## 2019-12-07 ENCOUNTER — Ambulatory Visit: Payer: 59 | Attending: Obstetrics & Gynecology

## 2019-12-07 DIAGNOSIS — Z23 Encounter for immunization: Secondary | ICD-10-CM

## 2019-12-07 MED ORDER — OXYCODONE-ACETAMINOPHEN 5-325 MG PO TABS: 1.0000 | ORAL_TABLET | Freq: Four times a day (QID) | ORAL | 0 refills | Status: AC | PRN

## 2019-12-07 MED ORDER — SODIUM CHLORIDE 0.9 % IV SOLN: 510.0000 mg | Freq: Once | INTRAVENOUS | Status: DC

## 2019-12-07 MED ORDER — POLYSACCHARIDE IRON COMPLEX 150 MG PO CAPS: 150.0000 mg | ORAL_CAPSULE | Freq: Every day | ORAL | 3 refills | Status: DC

## 2019-12-07 MED ORDER — IBUPROFEN 800 MG PO TABS
800.0000 mg | ORAL_TABLET | Freq: Three times a day (TID) | ORAL | 0 refills | Status: DC
Start: 2019-12-07 — End: 2021-01-14

## 2019-12-07 MED ORDER — SODIUM CHLORIDE 0.9 % IV SOLN: 510.0000 mg | Freq: Once | INTRAVENOUS | 0 refills | Status: AC

## 2019-12-07 MED FILL — FERREX 150 CAPSULE: 150 | 30 days supply | Qty: 30 | Fill #0

## 2019-12-07 MED FILL — IBUPROFEN 800 MG TABS: 800 | 10 days supply | Qty: 30 | Fill #0

## 2019-12-07 NOTE — Lactation Note (Signed)
This note was copied from a baby's chart. Lactation Consultation Note  Patient Name: Cynthia Figueroa ALPFX'T Date: 12/07/2019 Reason for consult: Follow-up assessment;Primapara;1st time breastfeeding;Early term 37-38.6wks;Other (Comment) (back to bith weight) Baby is 41 hours old , back to birth weight and gaining steadily.  Per dad baby recently fed 43 ml.  Per mom the last time to the breast was last night with a Nipple Shield and the feeding tube and it went well.  LC reviewed the the [facility] plan with use of the Nipple Shield and provided extra #20 NS and #24 NS incase the #20 NS is to tight.  LC recommended since the weight is fine and the breast are fuller and heavier to  Use the syringe and instill EBM or formula into the top of the NS and latch,  Is the SNS is needed to use it .  After feeding with the NS may supplement if needed if the baby has not soften the breast and post pump both breast for 15 -20 mins/ save the milk to use with the next feeding.  Sore nipple and engorgement prevention and tx reviewed. Per mom nipples are sore , but no breakdown, ( fully dressed for D/C unable to assess. ),  LC highly recommended the shells between feedings except when sleeping.   per mom has a DEBP - [place] at home and is aware to post pump after feedings if using the Nipple shield .  LC offered to request and Lc O/P appt for next week and mom receptive.  LC placed a request.  Mom aware the clinic will call her.   Maternal Data Has patient been taught Hand Expression?: No (per mom able to hand express and see more milk)  Feeding Feeding Type:  (baby recently had a bottle 43 ml and unable to check latch) Nipple Type: Slow - flow  LATCH Score                   Interventions Interventions: Breast feeding basics reviewed;Shells;Comfort gels;Hand pump;DEBP;Coconut oil  Lactation Tools Discussed/Used Tools: Shells;Pump;Nipple Shields;Coconut oil;Comfort gels;17F feeding tube /  Syringe Nipple shield size: 20 Shell Type: Inverted Breast pump type: Manual;Double-Electric Breast Pump Pump Review: Milk Storage   Consult Status Consult Status: Follow-up (LC offered to request and LC O/P appt for next week and mom receptive , LC placed a rquest in Epic) Follow-up type: Out-patient    Cynthia Figueroa 12/07/2019, 1:05 PM

## 2019-12-07 NOTE — Progress Notes (Signed)
   Covid-19 Vaccination Clinic  Name:  Cynthia Figueroa    MRN: [id number] DOB: 1985/12/20  12/07/2019  [name] was observed post Covid-19 immunization for 15 minutes without incident. She was provided with Vaccine Information Sheet and instruction to access the V-Safe system.   [name] was instructed to call 911 with any severe reactions post vaccine: Marland Kitchen Difficulty breathing  . Swelling of face and throat  . A fast heartbeat  . A bad rash all over body  . Dizziness and weakness

## 2019-12-07 NOTE — Progress Notes (Signed)
CSW received consult due to score 10 on Edinburgh Depression Screen.    CSW congratulated MOB and FOB on the birth of infant. CSW advised MOB of CSW's role and the reason for CSW coming to visit with her. MOB reported that she answered the questions "wrong. I did it based off in general". CSW understanding and still provided MOB with PPD and SIDS education. MOB reported that she doesn't have any mental health hx and reported that she is already aware of SIDS. CSW still explained it to FOB as FOB reported not being aware. Both parents reported that infant would sleep in basinet once arrived home. MOB reported that her supports include family at this time. MOB expressed that since giving birth she feels tired. MOB expressed no other needs to this CSW.    CSW encouraged MOB to evaluate her mental health throughout the postpartum period with the use of the New Mom Checklist developed by [employer]partum [employer] as well as the [place] Postnatal Depression Scale and notify a medical professional if symptoms arise.     Cynthia Figueroa, MSW, LCSW [facility] [phone]

## 2019-12-07 NOTE — Progress Notes (Signed)
Per CNM request, RN called [phone] to set up iron infusion for next week. VM left with RN's and patient's phone numbers to schedule the appointment.

## 2019-12-07 NOTE — Discharge Summary (Addendum)
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 yo G1P1001 at [redacted]w[redacted]d was admitted to the hospital 12/04/2019 for induction of labor for gestational hypertension. Induction was not started due to maternal intolerance of vaginal exams related to history of severe vaginismus. The patient went for cesarean section due to Non-Reassuring FHR. Delivery details are as follows: Membrane Rupture Time/Date: 4:57 PM ,12/04/2019   Delivery Method:C-Section, Low Transverse  Details of operation can be found in separate operative Note.  Patient had an uncomplicated postpartum course. She is ambulating, tolerating a regular diet, passing flatus, and urinating well.  Patient is discharged home in stable condition on 12/07/19.      Newborn Data: Birth date:12/04/2019  Birth time:4:58 PM  Gender:Female  Living status:Living  Apgars:7 ,7  Weight:2820 g                                 Magnesium Sulfate received: No BMZ received: No Rhophylac:N/A MMR:N/A T-DaP:Given prenatally Flu:  Yes Transfusion:No  Physical exam  Vitals:   12/06/19 1340 12/06/19 2126 12/06/19 2345 12/07/19 0503  BP: (!) 147/72 (!) 141/85 132/80 133/90  Pulse: 92 100 81 98  Resp: $Remo'18 20 18 18  'ysSAC$ Temp: 98.4 F (36.9 C) 98 F (36.7 C)  98.1 F (36.7 C)  TempSrc: Oral Oral  Oral  SpO2:  100% 100% 100%  Weight:      Height:       General: alert, cooperative and no distress Lochia: appropriate Uterine Fundus: firm Incision: Dressing is clean, dry, and intact DVT Evaluation: neg for pain, tenderness, and cords Labs: Lab Results  Component Value Date   WBC 11.0 (H) 12/05/2019   HGB 7.2 (L) 12/05/2019   HCT 23.3 (L) 12/05/2019   MCV 91.7 12/05/2019   PLT 238 12/05/2019   CMP Latest Ref Rng & Units 12/04/2019  Glucose 70 - 99 mg/dL 78  BUN 6 - 20 mg/dL 7  Creatinine 0.44 - 1.00 mg/dL 0.87  Sodium 135 - 145 mmol/L 136  Potassium 3.5 - 5.1 mmol/L 4.1  Chloride 98 - 111 mmol/L 106  CO2 22 - 32 mmol/L 19(L)  Calcium 8.9 - 10.3 mg/dL 8.6(L)  Total Protein 6.5 - 8.1 g/dL 5.6(L)  Total Bilirubin 0.3 - 1.2 mg/dL 0.8  Alkaline Phos 38 - 126 U/L 157(H)  AST 15 - 41 U/L 32  ALT 0 - 44 U/L 14   Edinburgh Score:  Edinburgh Postnatal Depression Scale Screening Tool 12/06/2019  I have been able to laugh and see the funny side of things. 0  I have looked forward with enjoyment to things. 0  I have blamed myself unnecessarily when things went wrong. 2  I have been anxious or worried for no good reason. 2  I have felt scared or panicky for no good reason. 2  Things have been getting on top of me. 1  I have been so unhappy that I have had difficulty sleeping. 1  I have felt sad or miserable. 1  I have been so unhappy that I have been crying. 1  The thought of harming myself has occurred to me. 0  Edinburgh Postnatal Depression Scale Total 10      After visit meds:  Allergies as of 12/07/2019      Reactions   Lamisil [terbinafine] Hives      Medication List    STOP taking these medications    Doxylamine-Pyridoxine 10-10 MG Tbec   sertraline 50 MG tablet Commonly known as: ZOLOFT     TAKE these medications   ibuprofen 800 MG tablet Commonly known as: ADVIL Take 1 tablet (800 mg total) by mouth every 8 (eight) hours.   iron polysaccharides 150 MG capsule Commonly known as: NIFEREX Take 1 capsule (150 mg total) by mouth daily.   oxyCODONE-acetaminophen 5-325 MG tablet Commonly known as: PERCOCET/ROXICET Take 1-2 tablets by mouth every 6 (six) hours as needed for up to 5 days for moderate pain or severe pain.   PRENATAL VITAMIN PO Take 1 tablet by mouth daily.        Discharge home in stable condition Infant Feeding: Bottle and Breast Infant Disposition:home with mother Discharge instruction: per After Visit Summary and Postpartum booklet. Activity: Advance as tolerated. Pelvic rest for 6 weeks.  Diet: low salt diet Anticipated Birth Control: severe vaginismus, will discuss plan at [facility] visit Postpartum Appointment:6 weeks Additional Postpartum F/U: BP check 1 week and Iron infusion Future Appointments:No future appointments. Follow up Visit:  Follow-up Information    Waymon Amato, MD. Schedule an appointment as soon as possible for a visit in 6 week(s).   Specialty: Obstetrics and Gynecology Why: Return to [facility] in 1 week for BP check and then in 6 weeks for regular postpartum visit.  Contact information: [address] [place] [id number] [phone]                   12/07/2019 Arrie Eastern, CNM

## 2019-12-08 LAB — CULTURE, OB URINE
Culture: 10000 — AB
Special Requests: NORMAL

## 2019-12-08 MED FILL — OXYCODONE-APAP 5-325MG: 5-325 | 4 days supply | Qty: 30 | Fill #0

## 2019-12-13 DIAGNOSIS — O139 Gestational [pregnancy-induced] hypertension without significant proteinuria, unspecified trimester: Secondary | ICD-10-CM | POA: Diagnosis not present

## 2019-12-13 DIAGNOSIS — R030 Elevated blood-pressure reading, without diagnosis of hypertension: Secondary | ICD-10-CM | POA: Diagnosis not present

## 2019-12-13 MED FILL — NIFEdipine ER OSMOTIC RELEA: 30 | 30 days supply | Qty: 30 | Fill #0

## 2019-12-14 ENCOUNTER — Encounter (HOSPITAL_COMMUNITY): Payer: Self-pay | Admitting: Obstetrics & Gynecology

## 2019-12-14 ENCOUNTER — Inpatient Hospital Stay (HOSPITAL_COMMUNITY)
Admission: AD | Admit: 2019-12-14 | Discharge: 2019-12-14 | Disposition: A | Discharge status: Home / Self Care | Payer: 59 | Attending: Obstetrics & Gynecology | Admitting: Obstetrics & Gynecology

## 2019-12-14 DIAGNOSIS — Z791 Long term (current) use of non-steroidal anti-inflammatories (NSAID): Secondary | ICD-10-CM | POA: Diagnosis not present

## 2019-12-14 DIAGNOSIS — E282 Polycystic ovarian syndrome: Secondary | ICD-10-CM | POA: Insufficient documentation

## 2019-12-14 DIAGNOSIS — D563 Thalassemia minor: Secondary | ICD-10-CM | POA: Insufficient documentation

## 2019-12-14 DIAGNOSIS — K219 Gastro-esophageal reflux disease without esophagitis: Secondary | ICD-10-CM | POA: Insufficient documentation

## 2019-12-14 DIAGNOSIS — Z79899 Other long term (current) drug therapy: Secondary | ICD-10-CM | POA: Insufficient documentation

## 2019-12-14 DIAGNOSIS — O135 Gestational [pregnancy-induced] hypertension without significant proteinuria, complicating the puerperium: Secondary | ICD-10-CM | POA: Insufficient documentation

## 2019-12-14 DIAGNOSIS — D62 Acute posthemorrhagic anemia: Secondary | ICD-10-CM

## 2019-12-14 DIAGNOSIS — Z883 Allergy status to other anti-infective agents status: Secondary | ICD-10-CM | POA: Insufficient documentation

## 2019-12-14 DIAGNOSIS — O165 Unspecified maternal hypertension, complicating the puerperium: Secondary | ICD-10-CM

## 2019-12-14 DIAGNOSIS — O133 Gestational [pregnancy-induced] hypertension without significant proteinuria, third trimester: Secondary | ICD-10-CM

## 2019-12-14 LAB — CBC
HCT: 34.4 % — ABNORMAL LOW (ref 36.0–46.0)
Hemoglobin: 10.9 g/dL — ABNORMAL LOW (ref 12.0–15.0)
MCH: 29 pg (ref 26.0–34.0)
MCHC: 31.7 g/dL (ref 30.0–36.0)
MCV: 91.5 fL (ref 80.0–100.0)
Platelets: 479 10*3/uL — ABNORMAL HIGH (ref 150–400)
RBC: 3.76 MIL/uL — ABNORMAL LOW (ref 3.87–5.11)
RDW: 14.7 % (ref 11.5–15.5)
WBC: 10.7 10*3/uL — ABNORMAL HIGH (ref 4.0–10.5)
nRBC: 0 % (ref 0.0–0.2)

## 2019-12-14 LAB — COMPREHENSIVE METABOLIC PANEL
ALT: 17 U/L (ref 0–44)
AST: 20 U/L (ref 15–41)
Albumin: 3.2 g/dL — ABNORMAL LOW (ref 3.5–5.0)
Alkaline Phosphatase: 117 U/L (ref 38–126)
Anion gap: 9 (ref 5–15)
BUN: 9 mg/dL (ref 6–20)
CO2: 24 mmol/L (ref 22–32)
Calcium: 9 mg/dL (ref 8.9–10.3)
Chloride: 100 mmol/L (ref 98–111)
Creatinine, Ser: 0.85 mg/dL (ref 0.44–1.00)
GFR calc Af Amer: >60 mL/min (ref >60)
GFR calc non Af Amer: >60 mL/min (ref >60)
Glucose, Bld: 80 mg/dL (ref 70–99)
Potassium: 3.5 mmol/L (ref 3.5–5.1)
Sodium: 133 mmol/L — ABNORMAL LOW (ref 135–145)
Total Bilirubin: 0.4 mg/dL (ref 0.3–1.2)
Total Protein: 6.6 g/dL (ref 6.5–8.1)

## 2019-12-14 NOTE — MAU Provider Note (Addendum)
History     CSN: [id number]  Arrival date and time: 12/14/19 1755  Chief Complaint  Patient presents with  . Hypertension  . Pelvic Pain   34 y.o. G1P1 s/p CS 10 days ago presenting with elevated BP. She reports a home blood pressure of 160/115 this evening and was told to come in. Denies HA, visual disturbances, RUQ pain, SOB, and CP. Her pregnancy was complicated by gHTN. She was started on Procardia 30 mg XL yesterday and took her second dose around 1400 today.  OB History as of 12/04/2019    Gravida  1   Para  1   Term  1   Preterm  0   AB  0   Living  1     SAB  0   TAB  0   Ectopic  0   Multiple  0   Live Births  1           Past Medical History:  Diagnosis Date  . Anxiety   . GERD (gastroesophageal reflux disease)   . Infertility, female   . PCOS (polycystic ovarian syndrome)   . Pre-diabetes   . 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 Su Hilt (GYN)  . Vaginismus   . Vitamin D deficiency     Past Surgical History:  Procedure Laterality Date  . CESAREAN SECTION N/A 12/04/2019   Procedure: CESAREAN SECTION;  Surgeon: Hoover Browns, MD;  Location: [facility] LD ORS;  Service: Obstetrics;  Laterality: N/A;    Family History  Problem Relation Age of Onset  . High blood pressure Mother   . Thyroid disease Mother   . Obesity Mother   . AAA (abdominal aortic aneurysm) Father     Social History   Tobacco Use  . Smoking status: Never Smoker  . Smokeless tobacco: Never Used  Substance Use Topics  . Alcohol use: No  . Drug use: No    Allergies:  Allergies  Allergen Reactions  . Lamisil [Terbinafine] Hives    Medications Prior to Admission  Medication Sig Dispense Refill Last Dose  . NIFEdipine (PROCARDIA-XL/NIFEDICAL-XL) 30 MG 24 hr tablet Take 30 mg by mouth daily.   12/14/2019 at 1450  . Prenatal Vit-Fe Fumarate-FA (PRENATAL VITAMIN PO) Take 1 tablet by mouth daily.   12/13/2019 at Unknown time  . ibuprofen (ADVIL) 800  MG tablet Take 1 tablet (800 mg total) by mouth every 8 (eight) hours. 30 tablet 0 12/13/2019 at 2100  . iron polysaccharides (NIFEREX) 150 MG capsule Take 1 capsule (150 mg total) by mouth daily. 30 capsule 3     Review of Systems  Eyes: Negative for visual disturbance.  Respiratory: Negative for shortness of breath.   Cardiovascular: Negative for chest pain.  Neurological: Positive for dizziness. Negative for headaches.   Physical Exam   Blood pressure 115/78, pulse 88, temperature 98.6 F (37 C), resp. rate 18, weight 80.7 kg, last menstrual period 03/08/2019, SpO2 99 %, unknown if currently breastfeeding. Patient Vitals for the past 24 hrs:  BP Temp Pulse Resp SpO2 Weight  12/14/19 1900 115/78 -- 88 -- 99 % --  12/14/19 1845 (!) 125/93 -- -- -- -- --  12/14/19 1836 129/90 -- -- -- -- --  12/14/19 1822 (!) 130/95 98.6 F (37 C) (!) 110 18 100 % 80.7 kg    Physical Exam Vitals and nursing note reviewed. Exam conducted with a chaperone present.  Constitutional:      Appearance: Normal appearance.  HENT:     Head: Normocephalic and atraumatic.  Cardiovascular:     Rate and Rhythm: Normal rate.  Pulmonary:     Effort: Pulmonary effort is normal. No respiratory distress.  Musculoskeletal:        General: Normal range of motion.     Cervical back: Normal range of motion.  Skin:    General: Skin is warm and dry.  Neurological:     General: No focal deficit present.     Mental Status: She is alert and oriented to person, place, and time.  Psychiatric:        Mood and Affect: Mood normal.    No results found for this or any previous visit (from the past 24 hour(s)).  MAU Course  Procedures  MDM Labs ordered and pending. Transfer of care given to Lorenda Peck, CNM  12/14/2019 8:02 PM   Results for orders placed or performed during the hospital encounter of 12/14/19 (from the past 24 hour(s))  CBC     Status: Abnormal   Collection Time: 12/14/19  7:26  PM  Result Value Ref Range   WBC 10.7 (H) 4.0 - 10.5 K/uL   RBC 3.76 (L) 3.87 - 5.11 MIL/uL   Hemoglobin 10.9 (L) 12.0 - 15.0 g/dL   HCT 93.7 (L) 36 - 46 %   MCV 91.5 80.0 - 100.0 fL   MCH 29.0 26.0 - 34.0 pg   MCHC 31.7 30.0 - 36.0 g/dL   RDW 90.2 40.9 - 73.5 %   Platelets 479 (H) 150 - 400 K/uL   nRBC 0.00 0.0 - 0.2 %  Comprehensive metabolic panel     Status: Abnormal   Collection Time: 12/14/19  7:26 PM  Result Value Ref Range   Sodium 133 (L) 135 - 145 mmol/L   Potassium 3.5 3.5 - 5.1 mmol/L   Chloride 100 98 - 111 mmol/L   CO2 24 22 - 32 mmol/L   Glucose, Bld 80 70 - 99 mg/dL   BUN 9 6 - 20 mg/dL   Creatinine, Ser 3.29 0.44 - 1.00 mg/dL   Calcium 9.0 8.9 - 92.4 mg/dL   Total Protein 6.6 6.5 - 8.1 g/dL   Albumin 3.2 (L) 3.5 - 5.0 g/dL   AST 20 15 - 41 U/L   ALT 17 0 - 44 U/L   Alkaline Phosphatase 117 38 - 126 U/L   Total Bilirubin 0.4 0.3 - 1.2 mg/dL   GFR calc non Af Amer >60 >60 mL/min   GFR calc Af Amer >60 >60 mL/min   Anion gap 9 5 - 15   Vitals:   12/14/19 2030 12/14/19 2045 12/14/19 2100 12/14/19 2118  BP: 124/87 122/86 118/87 118/87  Pulse: 97 94 94 94  Resp:      Temp:      SpO2:      Weight:       Reviewed lab results are normal  Blood pressure values are not in severe range Recommend continuing dose she is on   Assessment and Plan  A:   PostOp Day #10 s/p C/S       Gestational Hypertension        Normal labs, no severe range pressures  P:   Discharge home       Continue meds as ordered        Preeclampsia precautions       Followup in office as scheduled Encouraged to return here or to other Urgent Care/ED if she  develops worsening of symptoms, increase in pain, fever, or other concerning symptoms.    Aviva Signs, CNM

## 2019-12-14 NOTE — Discharge Instructions (Signed)

## 2019-12-14 NOTE — MAU Note (Signed)
PT SAYS SHE DELIVERED C/S ON 8-23.  HAD HIGH BP ON WEEKEND BEFORE DELIVERED.  NO MEDS FOR BP SENT HOME . HOME HEALTH CAME TO HOUSE ON Tuesday- BP 160/100.   ( PT HAS HIGH READINGS  WHEN CHECKING BP AT HOME )     NURSE WAS GOING TO CALL DR.  Micah Flesher TO OFFICE YESTERDAY- 140/90- STARTED PROCARDIA  X1 DOSE.   THEN TODAY BP WAS 160/102- TOOK  X1 DOSE 30  MG - AT 250PM.   TODAY AT HOME AT 527PM- BP WAS 160/115.  Marland Kitchen FEELS LIGHT- HEADED.   DENIES H/A, BLURRED VISION, EPIGASTRIC PAIN .

## 2019-12-14 NOTE — MAU Note (Signed)
.   Cynthia Figueroa is a 33 y.o. at [redacted]w[redacted]d here in MAU reporting: she was sent for an increase in her blood pressure. Pt states she was started on Procardia XL 30mg  yesterday  LMP: C/S 12/04/19 Onset of complaint: after dellivery Pain score: 0 Vitals:   12/14/19 1822  BP: (!) 130/95  Pulse: (!) 110  Resp: 18  Temp: 98.6 F (37 C)  SpO2: 100%     FHT: Lab orders placed from triage: UA

## 2019-12-20 DIAGNOSIS — Z8759 Personal history of other complications of pregnancy, childbirth and the puerperium: Secondary | ICD-10-CM | POA: Diagnosis not present

## 2020-01-10 DIAGNOSIS — F321 Major depressive disorder, single episode, moderate: Secondary | ICD-10-CM | POA: Diagnosis not present

## 2020-01-10 DIAGNOSIS — N942 Vaginismus: Secondary | ICD-10-CM | POA: Diagnosis not present

## 2020-01-10 DIAGNOSIS — Z3040 Encounter for surveillance of contraceptives, unspecified: Secondary | ICD-10-CM | POA: Diagnosis not present

## 2020-01-10 MED FILL — DROSPIRENONE-EE 3-0.02 MG T: 3-0.02 | 84 days supply | Qty: 84 | Fill #0

## 2020-02-07 DIAGNOSIS — F411 Generalized anxiety disorder: Secondary | ICD-10-CM | POA: Diagnosis not present

## 2020-02-07 DIAGNOSIS — F4323 Adjustment disorder with mixed anxiety and depressed mood: Secondary | ICD-10-CM | POA: Diagnosis not present

## 2020-02-07 DIAGNOSIS — F53 Postpartum depression: Secondary | ICD-10-CM | POA: Diagnosis not present

## 2020-02-28 DIAGNOSIS — F53 Postpartum depression: Secondary | ICD-10-CM | POA: Diagnosis not present

## 2020-02-28 DIAGNOSIS — F4323 Adjustment disorder with mixed anxiety and depressed mood: Secondary | ICD-10-CM | POA: Diagnosis not present

## 2020-02-28 DIAGNOSIS — F411 Generalized anxiety disorder: Secondary | ICD-10-CM | POA: Diagnosis not present

## 2020-04-04 MED FILL — SERTRALINE HCL 50 MG TABLET: 50 | 60 days supply | Qty: 60 | Fill #2

## 2020-04-30 DIAGNOSIS — F411 Generalized anxiety disorder: Secondary | ICD-10-CM | POA: Diagnosis not present

## 2020-04-30 DIAGNOSIS — F53 Postpartum depression: Secondary | ICD-10-CM | POA: Diagnosis not present

## 2020-04-30 DIAGNOSIS — F4323 Adjustment disorder with mixed anxiety and depressed mood: Secondary | ICD-10-CM | POA: Diagnosis not present

## 2020-05-06 ENCOUNTER — Other Ambulatory Visit (HOSPITAL_COMMUNITY): Payer: Self-pay

## 2020-05-06 MED FILL — AMOXICILLIN 500 MG CAPSULE: 500 | 7 days supply | Qty: 21 | Fill #0

## 2020-05-13 DIAGNOSIS — F53 Postpartum depression: Secondary | ICD-10-CM | POA: Diagnosis not present

## 2020-05-13 DIAGNOSIS — F411 Generalized anxiety disorder: Secondary | ICD-10-CM | POA: Diagnosis not present

## 2020-05-13 DIAGNOSIS — F4323 Adjustment disorder with mixed anxiety and depressed mood: Secondary | ICD-10-CM | POA: Diagnosis not present

## 2020-05-14 DIAGNOSIS — F53 Postpartum depression: Secondary | ICD-10-CM | POA: Diagnosis not present

## 2020-05-14 DIAGNOSIS — F411 Generalized anxiety disorder: Secondary | ICD-10-CM | POA: Diagnosis not present

## 2020-05-14 DIAGNOSIS — F4323 Adjustment disorder with mixed anxiety and depressed mood: Secondary | ICD-10-CM | POA: Diagnosis not present

## 2020-06-11 ENCOUNTER — Other Ambulatory Visit (HOSPITAL_COMMUNITY): Payer: Self-pay | Admitting: Internal Medicine

## 2020-06-11 DIAGNOSIS — R635 Abnormal weight gain: Secondary | ICD-10-CM | POA: Diagnosis not present

## 2020-06-11 DIAGNOSIS — R5383 Other fatigue: Secondary | ICD-10-CM | POA: Diagnosis not present

## 2020-06-11 DIAGNOSIS — R0602 Shortness of breath: Secondary | ICD-10-CM | POA: Diagnosis not present

## 2020-06-11 DIAGNOSIS — Z79899 Other long term (current) drug therapy: Secondary | ICD-10-CM | POA: Diagnosis not present

## 2020-06-11 MED FILL — PHENTERMINE 37.5 MG TABLET: 37.5 | 30 days supply | Qty: 30 | Fill #0

## 2020-06-21 ENCOUNTER — Encounter: Payer: Self-pay | Admitting: Podiatry

## 2020-06-21 ENCOUNTER — Other Ambulatory Visit: Payer: Self-pay

## 2020-06-21 ENCOUNTER — Ambulatory Visit: Payer: 59 | Admitting: Podiatry

## 2020-06-21 DIAGNOSIS — L600 Ingrowing nail: Secondary | ICD-10-CM | POA: Diagnosis not present

## 2020-06-21 NOTE — Patient Instructions (Signed)

## 2020-06-23 NOTE — Progress Notes (Signed)
Subjective:   Patient ID: Cynthia Figueroa, female   DOB: 35 y.o.   MRN: [id number]   HPI Patient states that the left big toenail has split in half and it is painful and she needs it removed.  States the right when that was removed did grow back normally and she understands this may or may not but she wants to go ahead and have it removed neuro   ROS      Objective:  Physical Exam  Vascular status intact with patient noted to have a damaged left hallux nail that is split in half and not properly adhered to the underlying nail bed.  Patient is found to have normal cap fill time     Assessment:  Probability that there is damage to the left hallux nail and it may have occurred with pedicure or from some other unknown trauma with damaged nail bed overall     Plan:  Reviewed condition recommended nail removal explaining this to patient.  At this point I infiltrated the left hallux 60 mg like Marcaine mixture sterile prep done using sterile instrumentation I remove the hallux nail flushed out the bed applied sterile dressing instructed on soaks and the fact that ultimately it may require permanent removal

## 2020-07-24 ENCOUNTER — Other Ambulatory Visit (HOSPITAL_COMMUNITY): Payer: Self-pay

## 2020-07-24 MED ORDER — DIAZEPAM 10 MG PO TABS
10.0000 mg | ORAL_TABLET | Freq: Every day | ORAL | 0 refills | Status: DC
  Filled 2020-07-24: qty 2, 1d supply, fill #0

## 2020-07-24 MED ORDER — IBUPROFEN 800 MG PO TABS
800.0000 mg | ORAL_TABLET | Freq: Every day | ORAL | 0 refills | Status: DC
Start: 2020-07-24 — End: 2021-01-14
  Filled 2020-07-24: qty 2, 1d supply, fill #0
  Filled 2020-08-13: qty 2, 2d supply, fill #0

## 2020-08-01 ENCOUNTER — Other Ambulatory Visit (HOSPITAL_COMMUNITY): Payer: Self-pay

## 2020-08-02 ENCOUNTER — Other Ambulatory Visit (HOSPITAL_COMMUNITY): Payer: Self-pay | Admitting: Obstetrics and Gynecology

## 2020-08-02 ENCOUNTER — Other Ambulatory Visit (HOSPITAL_COMMUNITY): Payer: Self-pay

## 2020-08-02 DIAGNOSIS — R635 Abnormal weight gain: Secondary | ICD-10-CM | POA: Diagnosis not present

## 2020-08-02 MED ORDER — SERTRALINE HCL 50 MG PO TABS
50.0000 mg | ORAL_TABLET | Freq: Every day | ORAL | 0 refills | Status: DC
  Filled 2020-08-02: qty 30, 30d supply, fill #0

## 2020-08-02 MED ORDER — PHENTERMINE HCL 37.5 MG PO TABS
37.5000 mg | ORAL_TABLET | Freq: Every day | ORAL | 0 refills | Status: DC
  Filled 2020-08-02: qty 30, 30d supply, fill #0

## 2020-08-05 ENCOUNTER — Other Ambulatory Visit (HOSPITAL_COMMUNITY): Payer: Self-pay

## 2020-08-13 ENCOUNTER — Other Ambulatory Visit (HOSPITAL_COMMUNITY): Payer: Self-pay

## 2020-08-13 DIAGNOSIS — F321 Major depressive disorder, single episode, moderate: Secondary | ICD-10-CM | POA: Diagnosis not present

## 2020-08-13 DIAGNOSIS — Z3040 Encounter for surveillance of contraceptives, unspecified: Secondary | ICD-10-CM | POA: Diagnosis not present

## 2020-08-13 DIAGNOSIS — Z6835 Body mass index (BMI) 35.0-35.9, adult: Secondary | ICD-10-CM | POA: Diagnosis not present

## 2020-08-13 DIAGNOSIS — N942 Vaginismus: Secondary | ICD-10-CM | POA: Diagnosis not present

## 2020-08-13 DIAGNOSIS — F419 Anxiety disorder, unspecified: Secondary | ICD-10-CM | POA: Diagnosis not present

## 2020-08-13 DIAGNOSIS — Z01411 Encounter for gynecological examination (general) (routine) with abnormal findings: Secondary | ICD-10-CM | POA: Diagnosis not present

## 2020-08-13 DIAGNOSIS — Z124 Encounter for screening for malignant neoplasm of cervix: Secondary | ICD-10-CM | POA: Diagnosis not present

## 2020-08-13 MED ORDER — SERTRALINE HCL 50 MG PO TABS
50.0000 mg | ORAL_TABLET | Freq: Every day | ORAL | 3 refills | Status: DC
Start: 2020-08-13 — End: 2021-01-14
  Filled 2020-08-13: qty 90, 90d supply, fill #0

## 2020-08-13 MED ORDER — SERTRALINE HCL 50 MG PO TABS
1.0000 | ORAL_TABLET | Freq: Every day | ORAL | 5 refills | Status: DC
Start: 2020-08-13 — End: 2021-01-14
  Filled 2020-08-13 – 2020-09-10 (×2): qty 60, 60d supply, fill #0
  Filled 2020-11-21: qty 60, 60d supply, fill #1

## 2020-08-13 MED ORDER — DIAZEPAM 10 MG PO TABS
ORAL_TABLET | Freq: Every day | ORAL | 0 refills | Status: DC
Start: 2020-08-13 — End: 2021-01-14
  Filled 2020-08-13: qty 2, 1d supply, fill #0

## 2020-08-13 MED ORDER — LEVONORGEST-ETH ESTRAD 91-DAY 0.15-0.03 &0.01 MG PO TABS
1.0000 | ORAL_TABLET | Freq: Every day | ORAL | 3 refills | Status: DC
Start: 2020-08-13 — End: 2021-09-19
  Filled 2020-08-13: qty 91, 91d supply, fill #0

## 2020-08-14 ENCOUNTER — Other Ambulatory Visit (HOSPITAL_COMMUNITY): Payer: Self-pay

## 2020-09-09 DIAGNOSIS — Z79899 Other long term (current) drug therapy: Secondary | ICD-10-CM | POA: Diagnosis not present

## 2020-09-09 DIAGNOSIS — Z6832 Body mass index (BMI) 32.0-32.9, adult: Secondary | ICD-10-CM | POA: Diagnosis not present

## 2020-09-09 DIAGNOSIS — R635 Abnormal weight gain: Secondary | ICD-10-CM | POA: Diagnosis not present

## 2020-09-10 ENCOUNTER — Other Ambulatory Visit (HOSPITAL_COMMUNITY): Payer: Self-pay

## 2020-09-10 MED ORDER — PHENTERMINE HCL 37.5 MG PO TABS
37.5000 mg | ORAL_TABLET | Freq: Every day | ORAL | 0 refills | Status: DC
  Filled 2020-09-10: qty 30, 30d supply, fill #0

## 2020-10-04 ENCOUNTER — Encounter: Payer: Self-pay | Admitting: Nurse Practitioner

## 2020-10-31 ENCOUNTER — Other Ambulatory Visit (HOSPITAL_COMMUNITY): Payer: Self-pay

## 2020-10-31 DIAGNOSIS — Z6832 Body mass index (BMI) 32.0-32.9, adult: Secondary | ICD-10-CM | POA: Diagnosis not present

## 2020-10-31 DIAGNOSIS — R635 Abnormal weight gain: Secondary | ICD-10-CM | POA: Diagnosis not present

## 2020-10-31 DIAGNOSIS — Z79899 Other long term (current) drug therapy: Secondary | ICD-10-CM | POA: Diagnosis not present

## 2020-10-31 MED ORDER — PHENTERMINE HCL 37.5 MG PO TABS
37.5000 mg | ORAL_TABLET | Freq: Every day | ORAL | 0 refills | Status: DC
Start: 2020-10-31 — End: 2021-01-14
  Filled 2020-10-31: qty 30, 30d supply, fill #0

## 2020-11-15 DIAGNOSIS — Z3041 Encounter for surveillance of contraceptive pills: Secondary | ICD-10-CM | POA: Diagnosis not present

## 2020-11-22 ENCOUNTER — Other Ambulatory Visit (HOSPITAL_COMMUNITY): Payer: Self-pay

## 2020-12-13 ENCOUNTER — Other Ambulatory Visit (HOSPITAL_COMMUNITY): Payer: Self-pay

## 2020-12-13 DIAGNOSIS — R635 Abnormal weight gain: Secondary | ICD-10-CM | POA: Diagnosis not present

## 2020-12-13 DIAGNOSIS — Z79899 Other long term (current) drug therapy: Secondary | ICD-10-CM | POA: Diagnosis not present

## 2020-12-13 DIAGNOSIS — Z6832 Body mass index (BMI) 32.0-32.9, adult: Secondary | ICD-10-CM | POA: Diagnosis not present

## 2020-12-13 MED ORDER — OZEMPIC (0.25 OR 0.5 MG/DOSE) 2 MG/1.5ML ~~LOC~~ SOPN
0.2500 mg | PEN_INJECTOR | SUBCUTANEOUS | 0 refills | Status: DC
  Filled 2020-12-13: qty 1.5, 56d supply, fill #0

## 2020-12-26 ENCOUNTER — Other Ambulatory Visit (HOSPITAL_COMMUNITY): Payer: Self-pay

## 2020-12-26 MED ORDER — CARESTART COVID-19 HOME TEST VI KIT
PACK | 0 refills | Status: DC
  Filled 2020-12-26: qty 4, 4d supply, fill #0

## 2021-01-14 ENCOUNTER — Other Ambulatory Visit (HOSPITAL_COMMUNITY): Payer: Self-pay

## 2021-01-14 ENCOUNTER — Other Ambulatory Visit: Payer: Self-pay

## 2021-01-14 ENCOUNTER — Encounter: Payer: Self-pay | Admitting: Nurse Practitioner

## 2021-01-14 ENCOUNTER — Ambulatory Visit: Payer: 59 | Admitting: Nurse Practitioner

## 2021-01-14 VITALS — BP 110/60 | HR 62 | Temp 98.6°F | Ht 61.0 in | Wt 194.0 lb

## 2021-01-14 DIAGNOSIS — F320 Major depressive disorder, single episode, mild: Secondary | ICD-10-CM | POA: Diagnosis not present

## 2021-01-14 DIAGNOSIS — R7303 Prediabetes: Secondary | ICD-10-CM

## 2021-01-14 DIAGNOSIS — E559 Vitamin D deficiency, unspecified: Secondary | ICD-10-CM | POA: Diagnosis not present

## 2021-01-14 DIAGNOSIS — Z2821 Immunization not carried out because of patient refusal: Secondary | ICD-10-CM | POA: Diagnosis not present

## 2021-01-14 DIAGNOSIS — Z1159 Encounter for screening for other viral diseases: Secondary | ICD-10-CM

## 2021-01-14 DIAGNOSIS — E6609 Other obesity due to excess calories: Secondary | ICD-10-CM

## 2021-01-14 DIAGNOSIS — Z7689 Persons encountering health services in other specified circumstances: Secondary | ICD-10-CM | POA: Diagnosis not present

## 2021-01-14 DIAGNOSIS — Z6836 Body mass index (BMI) 36.0-36.9, adult: Secondary | ICD-10-CM

## 2021-01-14 DIAGNOSIS — Z0000 Encounter for general adult medical examination without abnormal findings: Secondary | ICD-10-CM

## 2021-01-14 MED ORDER — SERTRALINE HCL 50 MG PO TABS
50.0000 mg | ORAL_TABLET | Freq: Every day | ORAL | 3 refills | Status: DC
Start: 2021-01-14 — End: 2022-02-19
  Filled 2021-01-14: qty 90, 90d supply, fill #0
  Filled 2021-05-09: qty 90, 90d supply, fill #1
  Filled 2021-08-20: qty 90, 90d supply, fill #2
  Filled 2021-11-26: qty 90, 90d supply, fill #3

## 2021-01-14 MED ORDER — OZEMPIC (0.25 OR 0.5 MG/DOSE) 2 MG/1.5ML ~~LOC~~ SOPN
0.5000 mg | PEN_INJECTOR | SUBCUTANEOUS | 1 refills | Status: DC
  Filled 2021-01-14 – 2021-01-25 (×2): qty 1.5, 28d supply, fill #0
  Filled 2021-05-30: qty 1.5, 28d supply, fill #1

## 2021-01-14 NOTE — Progress Notes (Signed)
I,Cynthia Figueroa,acting as a Education administrator for [employer], NP.,have documented all relevant documentation on the behalf of [employer], NP,as directed by  Cynthia Castilla, NP while in the presence of Cynthia Castilla, NP.  This visit occurred during the SARS-CoV-2 public health emergency.  Safety protocols were in place, including screening questions prior to the visit, additional usage of staff PPE, and extensive cleaning of exam room while observing appropriate contact time as indicated for disinfecting solutions.  Subjective:     Patient ID: Cynthia Figueroa , female    DOB: 01-04-1986 , 35 y.o.   MRN: [id number]   Chief Complaint  Patient presents with   Establish Care   Annual Exam    HPI  Patient presents today to establish primary care. She would like to have a physical done today. She is followed by Dr. Alesia Richards at [facility] for her GYN care. She has also been to [facility] weight loss management. She had a baby last year. She is not breast feeding. She is MA.  Diet: she is watching her sweet intake.  Exercise: She is not exercising.  Sexual active Yes LMP: last Tues.     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 Mancel Bale (GYN)   Vaginismus    Vitamin D deficiency      Family History  Problem Relation Age of Onset   High blood pressure Mother    Thyroid disease Mother    Obesity Mother    Healthy Father    Diabetes Maternal Grandmother    Alzheimer's disease Maternal Grandmother    Heart failure Maternal Grandmother    Thyroid disease Maternal Grandmother    Cancer Maternal Grandfather    Cancer Paternal Grandmother    Cancer Paternal Grandfather      Current Outpatient Medications:    Levonorgestrel-Ethinyl Estradiol (AMETHIA) 0.15-0.03 &0.01 MG tablet, Take 1  tablet by mouth daily. (Patient not taking: Reported on 01/14/2021), Disp: 91 tablet, Rfl: 3   Semaglutide,0.25 or 0.5MG/DOS, (OZEMPIC, 0.25 OR 0.5 MG/DOSE,) 2 MG/1.5ML SOPN, Inject 0.5 mg into the skin once a week., Disp: 1.5 mL, Rfl: 1   sertraline (ZOLOFT) 50 MG tablet, Take 1 tablet by mouth every day, Disp: 90 tablet, Rfl: 3   Allergies  Allergen Reactions   Lamisil [Terbinafine] Hives      The patient states she uses none for birth control. Last LMP was Patient's last menstrual period was 01/07/2021.. Negative for Dysmenorrhea. Negative for: breast discharge, breast lump(s), breast pain and breast self exam. Associated symptoms include abnormal vaginal bleeding. Pertinent negatives include abnormal bleeding (hematology), anxiety, decreased libido, depression, difficulty falling sleep, dyspareunia, history of infertility, nocturia, sexual dysfunction, sleep disturbances, urinary incontinence, urinary urgency, vaginal discharge and vaginal itching. Diet regular.The patient states her exercise level is    . The patient's tobacco use is:  Social History   Tobacco Use  Smoking Status Never  Smokeless Tobacco Never  . She has been exposed to passive smoke. The patient's alcohol use is:  Social History   Substance and Sexual Activity  Alcohol Use No  . Additional information: Last pap 2021, next one scheduled for 2024.    Review of Systems  Constitutional: Negative.  Negative for chills and fever.  HENT: Negative.  Negative for congestion, rhinorrhea and  sinus pain.   Eyes: Negative.  Negative for visual disturbance.  Respiratory: Negative.  Negative for cough, shortness of breath and wheezing.   Cardiovascular: Negative.  Negative for chest pain and palpitations.  Gastrointestinal: Negative.  Negative for constipation and diarrhea.  Endocrine: Negative.   Genitourinary: Negative.   Musculoskeletal: Negative.  Negative for arthralgias.  Skin: Negative.   Allergic/Immunologic:  Negative.   Neurological: Negative.  Negative for dizziness and headaches.  Hematological: Negative.   Psychiatric/Behavioral: Negative.      Today's Vitals   01/14/21 1416  BP: 110/60  Pulse: 62  Temp: 98.6 F (37 C)  SpO2: 98%  Weight: 194 lb (88 kg)  Height: _0  (1.549 m)   Body mass index is 36.66 kg/m.  Wt Readings from Last 3 Encounters:  01/14/21 194 lb (88 kg)  12/14/19 178 lb (80.7 kg)  12/04/19 210 lb 14.4 oz (95.7 kg)     Objective:  Physical Exam Vitals and nursing note reviewed.  Constitutional:      Appearance: Normal appearance.  HENT:     Head: Normocephalic and atraumatic.     Right Ear: Tympanic membrane, ear canal and external ear normal.     Left Ear: Tympanic membrane, ear canal and external ear normal.     Nose: Nose normal. No congestion or rhinorrhea.     Mouth/Throat:     Mouth: Mucous membranes are moist.     Pharynx: Oropharynx is clear.  Eyes:     Extraocular Movements: Extraocular movements intact.     Conjunctiva/sclera: Conjunctivae normal.     Pupils: Pupils are equal, round, and reactive to light.  Cardiovascular:     Rate and Rhythm: Normal rate and regular rhythm.     Pulses: Normal pulses.     Heart sounds: Normal heart sounds. No murmur heard. Pulmonary:     Effort: Pulmonary effort is normal. No respiratory distress.     Breath sounds: Normal breath sounds. No wheezing.  Chest:     Comments: Denied  Abdominal:     General: Abdomen is flat. Bowel sounds are normal.     Palpations: Abdomen is soft.  Genitourinary:    Comments: deferred Musculoskeletal:        General: Normal range of motion.     Cervical back: Normal range of motion and neck supple.  Skin:    General: Skin is warm and dry.     Capillary Refill: Capillary refill takes less than 2 seconds.  Neurological:     General: No focal deficit present.     Mental Status: She is alert and oriented to person, place, and time.  Psychiatric:        Mood and Affect:  Mood normal.        Behavior: Behavior normal.        Assessment And Plan:     1. Encounter to establish care with new doctor -Patient is here to establish care. Martin Majestic over patient medical, family, social and surgical history. -Reviewed with patient their medications and any allergies  -Reviewed with patient their sexual orientation, drug/tobacco and alcohol use -Dicussed any new concerns with patient  -recommended patient comes in for a physical exam and complete blood work.  -Educated patient about the importance of annual screenings and immunizations.  -Advised patient to eat a healthy diet along with exercise for atleast 30-45 min atleast 4-5 days of the week.    2. Encounter for general adult medical examination w/o abnormal findings --Patient is here  for their annual physical exam and we discussed any changes to medication and medical history.  -Behavior modification was discussed as well as diet and exercise history  -Patient will continue to exercise regularly and modify their diet.  -Recommendation for yearly physical annuals, immunization and screenings including mammogram and colonoscopy were discussed with the patient.  -Recommended intake of multivitamin, vitamin D and calcium.  -Individualized advise was given to the patient pertaining to their own health history in regards to diet, exercise, medical condition and referrals.  - CBC - CMP14+EGFR - Lipid panel  3. Influenza vaccination declined -Education given to patient in regards to flu vaccine.   4. Prediabetes --Discussed with patient the importance of glycemic control and long term complications from uncontrolled diabetes. Discussed with the patient the importance of compliance with home glucose monitoring, diet which includes decrease amount of sugary drinks and foods. Importance of exercise was also discussed with the patient. Importance of eye exams, self foot care and compliance to office visits was also  discussed with the patient.  - CBC - CMP14+EGFR - Hemoglobin A1c - Semaglutide,0.25 or 0.5MG/DOS, (OZEMPIC, 0.25 OR 0.5 MG/DOSE,) 2 MG/1.5ML SOPN; Inject 0.5 mg into the skin once a week.  Dispense: 1.5 mL; Refill: 1  5. Encounter for hepatitis C screening test for low risk patient - Hepatitis C antibody  6. Vitamin D deficiency -Will check and supplement if needed. Advised patient to spend atleast 15 min. Daily in sunlight.  - Vitamin D (25 hydroxy)  7. Current mild episode of major depressive disorder, unspecified whether recurrent ([place]) - Ambulatory referral to Psychology - sertraline (ZOLOFT) 50 MG tablet; Take 1 tablet by mouth every day  Dispense: 90 tablet; Refill: 3  8. Class 2 obesity due to excess calories with body mass index (BMI) of 36.0 to 36.9 in adult, unspecified whether serious comorbidity present -Advised patient on a healthy diet including avoiding fast food and red meats. Increase the intake of lean meats including grilled chicken and [place].  Drink a lot of water. Decrease intake of fatty foods. Exercise for 30-45 min. 4-5 a week to decrease the risk of cardiac event.   Follow up: if symptoms persist or do not get better.   The patient was encouraged to call or send a message through [place] for any questions or concerns.   Side effects and appropriate use of all the medication(s) were discussed with the patient today. Patient advised to use the medication(s) as directed by their healthcare provider. The patient was encouraged to read, review, and understand all associated package inserts and contact our office with any questions or concerns. The patient accepts the risks of the treatment plan and had an opportunity to ask questions.   Staying healthy and adopting a healthy lifestyle for your overall health is important. You should eat 7 or more servings of fruits and vegetables per day. You should drink plenty of water to keep yourself hydrated and your kidneys  healthy. This includes about 65-80+ fluid ounces of water. Limit your intake of animal fats especially for elevated cholesterol. Avoid highly processed food and limit your salt intake if you have hypertension. Avoid foods high in saturated/Trans fats. Along with a healthy diet it is also very important to maintain time for yourself to maintain a healthy mental health with low stress levels. You should get atleast 150 min of moderate intensity exercise weekly for a healthy heart. Along with eating right and exercising, aim for at least 7-9 hours of sleep  daily.  Eat more whole grains which includes barley, wheat berries, oats, brown rice and whole wheat pasta. Use healthy plant oils which include olive, soy, corn, sunflower and peanut. Limit your caffeine and sugary drinks. Limit your intake of fast foods. Limit milk and dairy products to one or two daily servings.   Patient was given opportunity to ask questions. Patient verbalized understanding of the plan and was able to repeat key elements of the plan. All questions were answered to their satisfaction.  Cynthia Lempi Edwin, DNP   I, Cynthia Figueroa have reviewed all documentation for this visit. The documentation on 01/14/21 for the exam, diagnosis, procedures, and orders are all accurate and complete.    THE PATIENT IS ENCOURAGED TO PRACTICE SOCIAL DISTANCING DUE TO THE COVID-19 PANDEMIC.

## 2021-01-15 ENCOUNTER — Other Ambulatory Visit (HOSPITAL_COMMUNITY): Payer: Self-pay

## 2021-01-15 ENCOUNTER — Other Ambulatory Visit: Payer: Self-pay | Admitting: Nurse Practitioner

## 2021-01-15 ENCOUNTER — Ambulatory Visit: Payer: 59 | Admitting: Nurse Practitioner

## 2021-01-15 DIAGNOSIS — E559 Vitamin D deficiency, unspecified: Secondary | ICD-10-CM

## 2021-01-15 LAB — CMP14+EGFR
ALT: 11 IU/L (ref 0–32)
AST: 18 IU/L (ref 0–40)
Albumin/Globulin Ratio: 1.5 (ref 1.2–2.2)
Albumin: 4.1 g/dL (ref 3.8–4.8)
Alkaline Phosphatase: 97 IU/L (ref 44–121)
BUN/Creatinine Ratio: 9 (ref 9–23)
BUN: 8 mg/dL (ref 6–20)
Bilirubin Total: 0.2 mg/dL (ref 0.0–1.2)
CO2: 25 mmol/L (ref 20–29)
Calcium: 9 mg/dL (ref 8.7–10.2)
Chloride: 103 mmol/L (ref 96–106)
Creatinine, Ser: 0.89 mg/dL (ref 0.57–1.00)
Globulin, Total: 2.8 g/dL (ref 1.5–4.5)
Glucose: 75 mg/dL (ref 70–99)
Potassium: 4.1 mmol/L (ref 3.5–5.2)
Sodium: 140 mmol/L (ref 134–144)
Total Protein: 6.9 g/dL (ref 6.0–8.5)
eGFR: 88 mL/min/{1.73_m2} (ref >59)

## 2021-01-15 LAB — CBC
Hematocrit: 38.8 % (ref 34.0–46.6)
Hemoglobin: 12.4 g/dL (ref 11.1–15.9)
MCH: 26.8 pg (ref 26.6–33.0)
MCHC: 32 g/dL (ref 31.5–35.7)
MCV: 84 fL (ref 79–97)
Platelets: 428 10*3/uL (ref 150–450)
RBC: 4.63 x10E6/uL (ref 3.77–5.28)
RDW: 12.4 % (ref 11.7–15.4)
WBC: 8.9 10*3/uL (ref 3.4–10.8)

## 2021-01-15 LAB — LIPID PANEL
Chol/HDL Ratio: 4.7 ratio — ABNORMAL HIGH (ref 0.0–4.4)
Cholesterol, Total: 160 mg/dL (ref 100–199)
HDL: 34 mg/dL — ABNORMAL LOW (ref >39)
LDL Chol Calc (NIH): 111 mg/dL — ABNORMAL HIGH (ref 0–99)
Triglycerides: 75 mg/dL (ref 0–149)
VLDL Cholesterol Cal: 15 mg/dL (ref 5–40)

## 2021-01-15 LAB — HEPATITIS C ANTIBODY: Hep C Virus Ab: <0.1 s/co ratio (ref 0.0–0.9)

## 2021-01-15 LAB — VITAMIN D 25 HYDROXY (VIT D DEFICIENCY, FRACTURES): Vit D, 25-Hydroxy: 27.9 ng/mL — ABNORMAL LOW (ref 30.0–100.0)

## 2021-01-15 LAB — HEMOGLOBIN A1C
Est. average glucose Bld gHb Est-mCnc: 126 mg/dL
Hgb A1c MFr Bld: 6 % — ABNORMAL HIGH (ref 4.8–5.6)

## 2021-01-15 MED ORDER — IBUPROFEN 800 MG PO TABS
800.0000 mg | ORAL_TABLET | Freq: Four times a day (QID) | ORAL | 2 refills | Status: DC | PRN
  Filled 2021-01-15: qty 20, 5d supply, fill #0

## 2021-01-15 MED ORDER — VITAMIN D (ERGOCALCIFEROL) 1.25 MG (50000 UNIT) PO CAPS
50000.0000 [IU] | ORAL_CAPSULE | ORAL | 0 refills | Status: DC
  Filled 2021-01-15: qty 12, 84d supply, fill #0

## 2021-01-15 MED ORDER — AMOXICILLIN 500 MG PO CAPS
500.0000 mg | ORAL_CAPSULE | Freq: Three times a day (TID) | ORAL | 0 refills | Status: DC
  Filled 2021-01-15: qty 21, 7d supply, fill #0

## 2021-01-27 ENCOUNTER — Other Ambulatory Visit (HOSPITAL_COMMUNITY): Payer: Self-pay

## 2021-02-04 ENCOUNTER — Other Ambulatory Visit (HOSPITAL_COMMUNITY): Payer: Self-pay

## 2021-02-04 MED ORDER — TRAMADOL HCL 50 MG PO TABS
50.0000 mg | ORAL_TABLET | ORAL | 0 refills | Status: DC
  Filled 2021-02-04: qty 1, 1d supply, fill #0

## 2021-02-04 MED ORDER — DIAZEPAM 10 MG PO TABS
10.0000 mg | ORAL_TABLET | ORAL | 0 refills | Status: DC
  Filled 2021-02-04: qty 1, 1d supply, fill #0
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
## 2021-02-11 ENCOUNTER — Ambulatory Visit: Payer: 59 | Admitting: Psychologist

## 2021-02-13 DIAGNOSIS — E288 Other ovarian dysfunction: Secondary | ICD-10-CM | POA: Diagnosis not present

## 2021-02-18 DIAGNOSIS — N942 Vaginismus: Secondary | ICD-10-CM | POA: Diagnosis not present

## 2021-02-18 DIAGNOSIS — N8500 Endometrial hyperplasia, unspecified: Secondary | ICD-10-CM | POA: Diagnosis not present

## 2021-03-05 ENCOUNTER — Other Ambulatory Visit (HOSPITAL_COMMUNITY): Payer: Self-pay

## 2021-03-05 MED ORDER — SHARPS CONTAINER MISC
0 refills | Status: DC
Start: 2021-03-05 — End: 2021-09-19
  Filled 2021-03-05: qty 1, 30d supply, fill #0

## 2021-03-05 MED ORDER — METHYLPREDNISOLONE 8 MG PO TABS
8.0000 mg | ORAL_TABLET | ORAL | 2 refills | Status: DC
Start: 2021-03-05 — End: 2021-09-19
  Filled 2021-03-05: qty 8, 4d supply, fill #0

## 2021-03-05 MED ORDER — PROGESTERONE 50 MG/ML IM OIL
TOPICAL_OIL | INTRAMUSCULAR | 0 refills | Status: DC
  Filled 2021-03-05: qty 30, 30d supply, fill #0

## 2021-03-05 MED ORDER — "BD HYPODERMIC NEEDLE 22G X 1-1/2"" MISC"
0 refills | Status: DC
  Filled 2021-03-05: qty 30, 30d supply, fill #0

## 2021-03-05 MED ORDER — "SYRINGE/NEEDLE (DISP) 21G X 1-1/2"" 3 ML MISC"
0 refills | Status: DC
  Filled 2021-03-05: qty 30, 30d supply, fill #0

## 2021-03-05 MED ORDER — ESTRADIOL 0.1 MG/24HR TD PTTW
1.0000 | MEDICATED_PATCH | TRANSDERMAL | 2 refills | Status: DC
  Filled 2021-03-05: qty 8, 28d supply, fill #0
  Filled 2021-03-25: qty 8, 28d supply, fill #1
  Filled 2021-04-25: qty 8, 28d supply, fill #2

## 2021-03-05 MED ORDER — ESTRADIOL 2 MG PO TABS
2.0000 mg | ORAL_TABLET | Freq: Two times a day (BID) | ORAL | 2 refills | Status: DC
Start: 2021-03-05 — End: 2021-09-19
  Filled 2021-03-05: qty 60, 30d supply, fill #0
  Filled 2021-04-04: qty 60, 30d supply, fill #1
  Filled 2021-05-09: qty 60, 30d supply, fill #2

## 2021-03-17 DIAGNOSIS — Z3201 Encounter for pregnancy test, result positive: Secondary | ICD-10-CM | POA: Diagnosis not present

## 2021-03-17 DIAGNOSIS — E282 Polycystic ovarian syndrome: Secondary | ICD-10-CM | POA: Diagnosis not present

## 2021-03-17 DIAGNOSIS — Z3200 Encounter for pregnancy test, result unknown: Secondary | ICD-10-CM | POA: Diagnosis not present

## 2021-03-17 DIAGNOSIS — N942 Vaginismus: Secondary | ICD-10-CM | POA: Diagnosis not present

## 2021-03-17 DIAGNOSIS — N970 Female infertility associated with anovulation: Secondary | ICD-10-CM | POA: Diagnosis not present

## 2021-03-17 DIAGNOSIS — Z3183 Encounter for assisted reproductive fertility procedure cycle: Secondary | ICD-10-CM | POA: Diagnosis not present

## 2021-03-17 DIAGNOSIS — Z113 Encounter for screening for infections with a predominantly sexual mode of transmission: Secondary | ICD-10-CM | POA: Diagnosis not present

## 2021-03-18 ENCOUNTER — Ambulatory Visit: Payer: 59 | Admitting: Nurse Practitioner

## 2021-03-26 ENCOUNTER — Other Ambulatory Visit (HOSPITAL_COMMUNITY): Payer: Self-pay

## 2021-03-26 DIAGNOSIS — Z3183 Encounter for assisted reproductive fertility procedure cycle: Secondary | ICD-10-CM | POA: Diagnosis not present

## 2021-03-27 ENCOUNTER — Other Ambulatory Visit (HOSPITAL_COMMUNITY): Payer: Self-pay

## 2021-04-03 DIAGNOSIS — Z3200 Encounter for pregnancy test, result unknown: Secondary | ICD-10-CM | POA: Diagnosis not present

## 2021-04-03 DIAGNOSIS — Z3201 Encounter for pregnancy test, result positive: Secondary | ICD-10-CM | POA: Diagnosis not present

## 2021-04-04 ENCOUNTER — Other Ambulatory Visit (HOSPITAL_COMMUNITY): Payer: Self-pay

## 2021-04-08 DIAGNOSIS — Z3200 Encounter for pregnancy test, result unknown: Secondary | ICD-10-CM | POA: Diagnosis not present

## 2021-04-10 DIAGNOSIS — Z3183 Encounter for assisted reproductive fertility procedure cycle: Secondary | ICD-10-CM | POA: Diagnosis not present

## 2021-04-10 DIAGNOSIS — Z113 Encounter for screening for infections with a predominantly sexual mode of transmission: Secondary | ICD-10-CM | POA: Diagnosis not present

## 2021-04-10 DIAGNOSIS — Z3201 Encounter for pregnancy test, result positive: Secondary | ICD-10-CM | POA: Diagnosis not present

## 2021-04-10 DIAGNOSIS — N970 Female infertility associated with anovulation: Secondary | ICD-10-CM | POA: Diagnosis not present

## 2021-04-10 DIAGNOSIS — E282 Polycystic ovarian syndrome: Secondary | ICD-10-CM | POA: Diagnosis not present

## 2021-04-10 DIAGNOSIS — Z3200 Encounter for pregnancy test, result unknown: Secondary | ICD-10-CM | POA: Diagnosis not present

## 2021-04-25 ENCOUNTER — Other Ambulatory Visit (HOSPITAL_COMMUNITY): Payer: Self-pay

## 2021-04-28 ENCOUNTER — Other Ambulatory Visit (HOSPITAL_COMMUNITY): Payer: Self-pay

## 2021-04-28 DIAGNOSIS — E282 Polycystic ovarian syndrome: Secondary | ICD-10-CM | POA: Diagnosis not present

## 2021-04-28 DIAGNOSIS — N970 Female infertility associated with anovulation: Secondary | ICD-10-CM | POA: Diagnosis not present

## 2021-04-28 DIAGNOSIS — Z3183 Encounter for assisted reproductive fertility procedure cycle: Secondary | ICD-10-CM | POA: Diagnosis not present

## 2021-04-28 DIAGNOSIS — Z3201 Encounter for pregnancy test, result positive: Secondary | ICD-10-CM | POA: Diagnosis not present

## 2021-04-28 DIAGNOSIS — Z113 Encounter for screening for infections with a predominantly sexual mode of transmission: Secondary | ICD-10-CM | POA: Diagnosis not present

## 2021-04-28 DIAGNOSIS — Z3200 Encounter for pregnancy test, result unknown: Secondary | ICD-10-CM | POA: Diagnosis not present

## 2021-04-28 MED ORDER — PROGESTERONE 50 MG/ML IM OIL
50.0000 mg | TOPICAL_OIL | Freq: Every day | INTRAMUSCULAR | 2 refills | Status: DC
  Filled 2021-04-28: qty 30, 30d supply, fill #0
  Filled 2021-05-23: qty 30, 30d supply, fill #1

## 2021-04-28 MED ORDER — "BD LUER-LOK SYRINGE 18G X 1-1/2"" 3 ML MISC"
2 refills | Status: DC
  Filled 2021-04-28: qty 30, 30d supply, fill #0
  Filled 2021-05-23: qty 30, 30d supply, fill #1

## 2021-04-28 MED ORDER — METHYLPREDNISOLONE 8 MG PO TABS
8.0000 mg | ORAL_TABLET | Freq: Two times a day (BID) | ORAL | 2 refills | Status: DC
  Filled 2021-04-28: qty 8, 4d supply, fill #0

## 2021-04-28 MED ORDER — "BD HYPODERMIC NEEDLE 22G X 1"" MISC"
2 refills | Status: DC
  Filled 2021-04-28: qty 30, 30d supply, fill #0
  Filled 2021-05-23: qty 30, 30d supply, fill #1

## 2021-05-06 DIAGNOSIS — Z3183 Encounter for assisted reproductive fertility procedure cycle: Secondary | ICD-10-CM | POA: Diagnosis not present

## 2021-05-09 ENCOUNTER — Other Ambulatory Visit (HOSPITAL_COMMUNITY): Payer: Self-pay

## 2021-05-14 DIAGNOSIS — Z3200 Encounter for pregnancy test, result unknown: Secondary | ICD-10-CM | POA: Diagnosis not present

## 2021-05-14 DIAGNOSIS — Z3201 Encounter for pregnancy test, result positive: Secondary | ICD-10-CM | POA: Diagnosis not present

## 2021-05-15 ENCOUNTER — Other Ambulatory Visit (HOSPITAL_COMMUNITY): Payer: Self-pay

## 2021-05-16 ENCOUNTER — Other Ambulatory Visit (HOSPITAL_COMMUNITY): Payer: Self-pay

## 2021-05-16 DIAGNOSIS — Z3201 Encounter for pregnancy test, result positive: Secondary | ICD-10-CM | POA: Diagnosis not present

## 2021-05-16 DIAGNOSIS — Z113 Encounter for screening for infections with a predominantly sexual mode of transmission: Secondary | ICD-10-CM | POA: Diagnosis not present

## 2021-05-16 DIAGNOSIS — N970 Female infertility associated with anovulation: Secondary | ICD-10-CM | POA: Diagnosis not present

## 2021-05-16 DIAGNOSIS — E282 Polycystic ovarian syndrome: Secondary | ICD-10-CM | POA: Diagnosis not present

## 2021-05-16 DIAGNOSIS — Z3200 Encounter for pregnancy test, result unknown: Secondary | ICD-10-CM | POA: Diagnosis not present

## 2021-05-16 DIAGNOSIS — Z3183 Encounter for assisted reproductive fertility procedure cycle: Secondary | ICD-10-CM | POA: Diagnosis not present

## 2021-05-19 ENCOUNTER — Other Ambulatory Visit (HOSPITAL_COMMUNITY): Payer: Self-pay

## 2021-05-20 ENCOUNTER — Other Ambulatory Visit (HOSPITAL_COMMUNITY): Payer: Self-pay

## 2021-05-20 MED ORDER — ESTRADIOL 0.1 MG/24HR TD PTTW
1.0000 | MEDICATED_PATCH | TRANSDERMAL | 2 refills | Status: DC
  Filled 2021-05-20: qty 8, 28d supply, fill #0
  Filled 2021-06-09 – 2021-06-11 (×2): qty 8, 28d supply, fill #1

## 2021-05-23 ENCOUNTER — Other Ambulatory Visit (HOSPITAL_COMMUNITY): Payer: Self-pay

## 2021-05-27 ENCOUNTER — Other Ambulatory Visit (HOSPITAL_COMMUNITY): Payer: Self-pay

## 2021-05-27 DIAGNOSIS — Z3200 Encounter for pregnancy test, result unknown: Secondary | ICD-10-CM | POA: Diagnosis not present

## 2021-05-27 MED ORDER — FOLIC ACID 1 MG PO TABS
1.0000 mg | ORAL_TABLET | Freq: Every day | ORAL | 2 refills | Status: DC
  Filled 2021-05-27: qty 90, 90d supply, fill #0

## 2021-05-30 ENCOUNTER — Other Ambulatory Visit (HOSPITAL_COMMUNITY): Payer: Self-pay

## 2021-06-03 DIAGNOSIS — O0900 Supervision of pregnancy with history of infertility, unspecified trimester: Secondary | ICD-10-CM | POA: Diagnosis not present

## 2021-06-04 DIAGNOSIS — O26851 Spotting complicating pregnancy, first trimester: Secondary | ICD-10-CM | POA: Diagnosis not present

## 2021-06-09 ENCOUNTER — Other Ambulatory Visit (HOSPITAL_COMMUNITY): Payer: Self-pay

## 2021-06-09 MED ORDER — ESTRADIOL 2 MG PO TABS
2.0000 mg | ORAL_TABLET | Freq: Two times a day (BID) | ORAL | 2 refills | Status: DC
  Filled 2021-06-09: qty 60, 30d supply, fill #0

## 2021-06-10 ENCOUNTER — Other Ambulatory Visit (HOSPITAL_COMMUNITY): Payer: Self-pay

## 2021-06-11 ENCOUNTER — Other Ambulatory Visit (HOSPITAL_COMMUNITY): Payer: Self-pay

## 2021-06-17 ENCOUNTER — Other Ambulatory Visit (HOSPITAL_COMMUNITY): Payer: Self-pay

## 2021-06-18 DIAGNOSIS — O0900 Supervision of pregnancy with history of infertility, unspecified trimester: Secondary | ICD-10-CM | POA: Diagnosis not present

## 2021-06-26 ENCOUNTER — Other Ambulatory Visit (HOSPITAL_COMMUNITY): Payer: Self-pay

## 2021-06-26 MED ORDER — PROGESTERONE 200 MG PO CAPS
200.0000 mg | ORAL_CAPSULE | Freq: Every day | ORAL | 0 refills | Status: DC
  Filled 2021-06-26: qty 14, 14d supply, fill #0

## 2021-07-10 ENCOUNTER — Other Ambulatory Visit (HOSPITAL_COMMUNITY): Payer: Self-pay

## 2021-07-10 DIAGNOSIS — R519 Headache, unspecified: Secondary | ICD-10-CM | POA: Diagnosis not present

## 2021-07-10 DIAGNOSIS — Z3A12 12 weeks gestation of pregnancy: Secondary | ICD-10-CM | POA: Diagnosis not present

## 2021-07-10 DIAGNOSIS — Z113 Encounter for screening for infections with a predominantly sexual mode of transmission: Secondary | ICD-10-CM | POA: Diagnosis not present

## 2021-07-10 DIAGNOSIS — N925 Other specified irregular menstruation: Secondary | ICD-10-CM | POA: Diagnosis not present

## 2021-07-10 DIAGNOSIS — N912 Amenorrhea, unspecified: Secondary | ICD-10-CM | POA: Diagnosis not present

## 2021-07-10 DIAGNOSIS — Z315 Encounter for genetic counseling: Secondary | ICD-10-CM | POA: Diagnosis not present

## 2021-07-10 DIAGNOSIS — O3680X9 Pregnancy with inconclusive fetal viability, other fetus: Secondary | ICD-10-CM | POA: Diagnosis not present

## 2021-07-10 DIAGNOSIS — Z3481 Encounter for supervision of other normal pregnancy, first trimester: Secondary | ICD-10-CM | POA: Diagnosis not present

## 2021-07-10 LAB — OB RESULTS CONSOLE ANTIBODY SCREEN: Antibody Screen: NEGATIVE

## 2021-07-10 LAB — OB RESULTS CONSOLE RPR: RPR: NONREACTIVE

## 2021-07-10 LAB — HEPATITIS C ANTIBODY: HCV Ab: NEGATIVE

## 2021-07-10 LAB — OB RESULTS CONSOLE HEPATITIS B SURFACE ANTIGEN: Hepatitis B Surface Ag: NEGATIVE

## 2021-07-10 LAB — OB RESULTS CONSOLE ABO/RH: RH Type: POSITIVE

## 2021-07-10 LAB — OB RESULTS CONSOLE HIV ANTIBODY (ROUTINE TESTING): HIV: NONREACTIVE

## 2021-07-10 LAB — OB RESULTS CONSOLE RUBELLA ANTIBODY, IGM: Rubella: IMMUNE

## 2021-07-10 MED ORDER — METOCLOPRAMIDE HCL 10 MG PO TABS
10.0000 mg | ORAL_TABLET | Freq: Four times a day (QID) | ORAL | 2 refills | Status: DC | PRN
  Filled 2021-07-10: qty 24, 6d supply, fill #0

## 2021-07-21 LAB — OB RESULTS CONSOLE GC/CHLAMYDIA
Chlamydia: NEGATIVE
Neisseria Gonorrhea: NEGATIVE

## 2021-08-08 ENCOUNTER — Encounter: Payer: Self-pay | Admitting: Podiatry

## 2021-08-08 ENCOUNTER — Ambulatory Visit: Payer: 59 | Admitting: Podiatry

## 2021-08-08 DIAGNOSIS — L600 Ingrowing nail: Secondary | ICD-10-CM

## 2021-08-08 DIAGNOSIS — L918 Other hypertrophic disorders of the skin: Secondary | ICD-10-CM | POA: Diagnosis not present

## 2021-08-08 DIAGNOSIS — B351 Tinea unguium: Secondary | ICD-10-CM

## 2021-08-08 NOTE — Progress Notes (Signed)
Subjective:  ? ?Patient ID: Cynthia Figueroa, female   DOB: 36 y.o.   MRN: [id number]  ? ?HPI ?Patient states she is developed more thickening of her nailbeds of both feet and states that she would like to do medicine but she is pregnant at this time.  Also complains about a small skin tag on the left side of her big toe which can be bothersome at times ? ? ?ROS ? ? ?   ?Objective:  ?Physical Exam  ?Neurovascular status intact small skin tag noted left hallux that irritative and nail disease is noted bilateral second left being worse.  Patient is pregnant so we are having to be careful with what we can do several ? ?   ?Assessment:  ?Different problems with 1 being chronic skin tag left hallux and secondarily nail disease bilateral ? ?   ?Plan:  ?H&P reviewed conditions and at this point I did debride his nails just to reduce them I discussed oral medication which we may need to do more aggressively in the future and I did discuss excision of this skin tag explaining procedure.  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   ? ? ?

## 2021-08-11 DIAGNOSIS — Z1371 Encounter for nonprocreative screening for genetic disease carrier status: Secondary | ICD-10-CM | POA: Diagnosis not present

## 2021-08-20 ENCOUNTER — Other Ambulatory Visit (HOSPITAL_COMMUNITY): Payer: Self-pay

## 2021-09-05 ENCOUNTER — Telehealth: Payer: Self-pay

## 2021-09-05 DIAGNOSIS — Z3A20 20 weeks gestation of pregnancy: Secondary | ICD-10-CM | POA: Diagnosis not present

## 2021-09-05 DIAGNOSIS — Z363 Encounter for antenatal screening for malformations: Secondary | ICD-10-CM | POA: Diagnosis not present

## 2021-09-05 NOTE — Telephone Encounter (Signed)
NOTES SCANNED TO REFERRAL 

## 2021-09-12 ENCOUNTER — Telehealth: Payer: Self-pay | Admitting: *Deleted

## 2021-09-12 NOTE — Telephone Encounter (Signed)
Pt referred to see Dr. Shari Prows at our Card[facility] at [facility].  Pt referred by OBGYN for palpitations.  Pt is currently pregnant at around 20 weeks.  Scheduled new pt appt from referral for 09/19/21 at 3:40 pm with Dr. Shari Prows.  Referral notes already scanned into the pts chart. Pt made aware of appt date and time.

## 2021-09-15 NOTE — Progress Notes (Signed)
Cardio-Obstetrics Clinic  New Evaluation  Date:  09/19/2021   ID:  Cynthia Figueroa, DOB November 15, 1985, MRN [id number]  PCP:  Pcp, No   [facility] Providers Cardiologist:  None  Electrophysiologist:  None   {  Referring MD: Cynthia Figueroa, CNM   Chief Complaint: palpitations  History of Present Illness:    Cynthia Figueroa is a 36 y.o. female [G1P1001] who is being seen today for the evaluation of palpitations at the request of Cynthia Figueroa, CNM.   Today, the patient is currently [redacted] weeks pregnant. She states she has been having palpitations almost daily lasting several seconds at time. Has some mild associated SOB, but no chest pain or lightheadedness. No known triggers and not associated with exertion. Blood pressure is currently well controlled. No orthopnea, PND or LE edema.  Notably, during first pregnancy, she developed severe hypertension, HD and changes in her vision at 39 weeks and was told she had pre-eclampsia. She was delivered that day. She had post-partum HTN and required labetalol for about 2 weeks. Her blood pressure since normalized. She is currently on ASA 81mg  daily and BP is well controlled.    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 09/02/2019 (GYN)   Vaginospasm    Vanishing twin syndrome    Vitamin D deficiency     Past Surgical History:  Procedure Laterality Date   CESAREAN SECTION N/A 12/04/2019   Procedure: CESAREAN SECTION;  Surgeon: 12/06/2019, MD;  Location: [facility];  Service: Obstetrics;  Laterality: N/A;       OB History     Gravida  1   Para  1   Term  1   Preterm  0   AB  0   Living  1      SAB  0   IAB  0   Ectopic  0   Multiple  0   Live Births  1               Current Medications: Current Meds  Medication Sig   aspirin EC 81 MG tablet Take 81 mg by mouth daily. Swallow whole.   metoCLOPramide (REGLAN) 10 MG tablet Take 1 tablet (10 mg total) by mouth 4 (four) times daily as needed.   Prenatal Vit-Fe Fumarate-FA (PRENATAL PO) Take by mouth.   sertraline (ZOLOFT) 50 MG tablet Take 1 tablet by mouth every day     Allergies:   Lamisil [terbinafine]   Social History   Socioeconomic History   Marital status: Married    Spouse name: Cynthia Figueroa   Number of children: Not on file   Years of education: Not on file   Highest education level: Not on file  Occupational History   Occupation: CMA  Tobacco Use   Smoking status: Never   Smokeless tobacco: Never  Substance and Sexual Activity   Alcohol use: No   Drug use: No   Sexual activity: Not Currently  Other Topics Concern   Not on file  Social History Narrative  Not on file   Social Determinants of Health   Financial Resource Strain: Not on file  Food Insecurity: Not on file  Transportation Needs: Not on file  Physical Activity: Not on file  Stress: Not on file  Social Connections: Not on file      Family History  Problem Relation Age of Onset   Anemia Mother    High blood pressure Mother    Thyroid disease Mother    Obesity Mother    Healthy Father    Anemia Sister    Diabetes Maternal Grandmother    Alzheimer's disease Maternal Grandmother    Heart failure Maternal Grandmother    Thyroid disease Maternal Grandmother    Cancer Maternal Grandfather    Cancer Paternal Grandmother    Cancer Paternal Grandfather       ROS:   Please see the history of present illness.     Review of Systems  Respiratory:  Positive for shortness of breath.   Cardiovascular:  Positive for  palpitations. Negative for chest pain, orthopnea, claudication, leg swelling and PND.  Neurological:  Negative for dizziness and loss of consciousness.      Labs/EKG Reviewed:    EKG:   EKG is  ordered today.  The ekg ordered today demonstrates NSR with HR 75  Recent Labs: 01/14/2021: ALT 11; BUN 8; Creatinine, Ser 0.89; Hemoglobin 12.4; Platelets 428; Potassium 4.1; Sodium 140   Recent Lipid Panel Lab Results  Component Value Date/Time   CHOL 160 01/14/2021 02:57 PM   TRIG 75 01/14/2021 02:57 PM   HDL 34 (L) 01/14/2021 02:57 PM   CHOLHDL 4.7 (H) 01/14/2021 02:57 PM   LDLCALC 111 (H) 01/14/2021 02:57 PM    Physical Exam:    VS:  BP 110/62   Pulse 74   Ht 5\' 1"  (1.549 m)   Wt 189 lb (85.7 kg)   SpO2 98%   BMI 35.71 kg/m     Wt Readings from Last 3 Encounters:  09/19/21 189 lb (85.7 kg)  01/14/21 194 lb (88 kg)  12/14/19 178 lb (80.7 kg)     GEN:  Well nourished, well developed in no acute distress HEENT: Normal NECK: No JVD; No carotid bruits CARDIAC: RRR, no murmurs, rubs, gallops RESPIRATORY:  Clear to auscultation without rales, wheezing or rhonchi  ABDOMEN: Gravid, NTTP MUSCULOSKELETAL:  No edema; No deformity  SKIN: Warm and dry NEUROLOGIC:  Alert and oriented x 3 PSYCHIATRIC:  Normal affect    Risk Assessment/Risk Calculators:     ASSESSMENT & PLAN:    #Palpitations: Patient currently with daily palpitations lasting several seconds at a time. Has some associated SOB but no lightheadedness, dizziness or syncope. Has history of preeclampsia in prior pregnancy but blood pressures are well controlled. Likely related to HD changes in pregnancy, but will check TTE and zio for further evaluation. -Check TTE -Check zio monitor -Increase hydration -Small frequent meals -PO mag at night  #History of Pre-Eclampsia: Developed at 39 weeks during last pregnancy and was delivered. Had postpartum HTN requiring labetalol for 2 weeks but then since resolved.  Currently, BP is well controlled. Currently on ASA for ppx.  -Continue ASA  81mg  daily -Continue close monitoring of BP during pregnancy  Patient Instructions  Medication Instructions:   Your physician recommends that you continue on your current medications as directed. Please refer to the Current Medication list given to you today.  *If you need a refill on your cardiac medications before your next appointment, please call  your pharmacy*   Testing/Procedures:  Your physician has requested that you have an echocardiogram. Echocardiography is a painless test that uses sound waves to create images of your heart. It provides your doctor with information about the size and shape of your heart and how well your heart's chambers and valves are working. This procedure takes approximately one hour. There are no restrictions for this procedure.  Cardiac-OB patient: to be performed by [place] or [place].    ZIO XT- Long Term Monitor Instructions  Your physician has requested you wear a ZIO patch monitor for 7  days.  This is a single patch monitor. [employer] supplies one patch monitor per enrollment. Additional stickers are not available. Please do not apply patch if you will be having a Nuclear Stress Test,  Echocardiogram, Cardiac CT, MRI, or Chest Xray during the period you would be wearing the  monitor. The patch cannot be worn during these tests. You cannot remove and re-apply the  ZIO XT patch monitor.  Your ZIO patch monitor will be mailed 3 day USPS to your address on file. It may take 3-5 days  to receive your monitor after you have been enrolled.  Once you have received your monitor, please review the enclosed instructions. Your monitor  has already been registered assigning a specific monitor serial # to you.  Billing and Patient Assistance Program Information  We have supplied [employer] with any of your insurance information on file for billing purposes. [employer] offers a sliding scale  Patient Assistance Program for patients that do not have  insurance, or whose insurance does not completely cover the cost of the ZIO monitor.  You must apply for the Patient Assistance Program to qualify for this discounted rate.  To apply, please call [employer] at [phone], select option 4, select option 2, ask to apply for  Patient Assistance Program. Meredeth Ide will ask your household income, and how many people  are in your household. They will quote your out-of-pocket cost based on that information.  [employer] will also be able to set up a 7-month, interest-free payment plan if needed.  Applying the monitor   Shave hair from upper left chest.  Hold abrader disc by orange tab. Rub abrader in 40 strokes over the upper left chest as  indicated in your monitor instructions.  Clean area with 4 enclosed alcohol pads. Let dry.  Apply patch as indicated in monitor instructions. Patch will be placed under collarbone on left  side of chest with arrow pointing upward.  Rub patch adhesive wings for 2 minutes. Remove white label marked "1". Remove the white  label marked "2". Rub patch adhesive wings for 2 additional minutes.  While looking in a mirror, press and release button in center of patch. A small green light will  flash 3-4 times. This will be your only indicator that the monitor has been turned on.  Do not shower for the first 24 hours. You may shower after the first 24 hours.  Press the button if you feel a symptom. You will hear a small click. Record Date, Time and  Symptom in the Patient Logbook.  When you are ready to remove the patch, follow instructions on the last 2 pages of Patient  Logbook. Stick patch monitor onto the last page of Patient Logbook.  Place Patient Logbook in the blue and white box. Use locking tab on box and tape box closed  securely. The blue and white box has prepaid postage on it. Please place it  in the mailbox as  soon as possible. Your physician should have  your test results approximately 7 days after the  monitor has been mailed back to [facility].  Call [facility] Customer Care at [phone] if you have questions regarding  your ZIO XT patch monitor. Call them immediately if you see an orange light blinking on your  monitor.  If your monitor falls off in less than 4 days, contact our Monitor department at [phone].  If your monitor becomes loose or falls off after 4 days call [employer] at [phone] for  suggestions on securing your monitor   Follow-Up:  12 WEEKS WITH DR. Shari ProwsPEMBERTON [facility]   Important Information About Sugar         Dispo:  No follow-ups on file.   Medication Adjustments/Labs and Tests Ordered: Current medicines are reviewed at length with the patient today.  Concerns regarding medicines are outlined above.  Tests Ordered: Orders Placed This Encounter  Procedures   LONG TERM MONITOR (3-14 DAYS)   ECHOCARDIOGRAM COMPLETE   Medication Changes: No orders of the defined types were placed in this encounter.

## 2021-09-19 ENCOUNTER — Ambulatory Visit (INDEPENDENT_AMBULATORY_CARE_PROVIDER_SITE_OTHER): Payer: 59 | Admitting: Cardiology

## 2021-09-19 VITALS — BP 110/62 | HR 74 | Ht 61.0 in | Wt 189.0 lb

## 2021-09-19 DIAGNOSIS — Z8759 Personal history of other complications of pregnancy, childbirth and the puerperium: Secondary | ICD-10-CM

## 2021-09-19 DIAGNOSIS — R002 Palpitations: Secondary | ICD-10-CM | POA: Diagnosis not present

## 2021-09-19 NOTE — Patient Instructions (Signed)
Medication Instructions:   Your physician recommends that you continue on your current medications as directed. Please refer to the Current Medication list given to you today.  *If you need a refill on your cardiac medications before your next appointment, please call your pharmacy*   Testing/Procedures:  Your physician has requested that you have an echocardiogram. Echocardiography is a painless test that uses sound waves to create images of your heart. It provides your doctor with information about the size and shape of your heart and how well your heart's chambers and valves are working. This procedure takes approximately one hour. There are no restrictions for this procedure.  Cardiac-OB patient: to be performed by [place] or [place].    ZIO XT- Long Term Monitor Instructions  Your physician has requested you wear a ZIO patch monitor for 7  days.  This is a single patch monitor. [employer] supplies one patch monitor per enrollment. Additional stickers are not available. Please do not apply patch if you will be having a Nuclear Stress Test,  Echocardiogram, Cardiac CT, MRI, or Chest Xray during the period you would be wearing the  monitor. The patch cannot be worn during these tests. You cannot remove and re-apply the  ZIO XT patch monitor.  Your ZIO patch monitor will be mailed 3 day USPS to your address on file. It may take 3-5 days  to receive your monitor after you have been enrolled.  Once you have received your monitor, please review the enclosed instructions. Your monitor  has already been registered assigning a specific monitor serial # to you.  Billing and Patient Assistance Program Information  We have supplied [employer] with any of your insurance information on file for billing purposes. [employer] offers a sliding scale Patient Assistance Program for patients that do not have  insurance, or whose insurance does not completely cover the cost of the ZIO monitor.  You must apply for  the Patient Assistance Program to qualify for this discounted rate.  To apply, please call [employer] at [phone], select option 4, select option 2, ask to apply for  Patient Assistance Program. Theodore Demark will ask your household income, and how many people  are in your household. They will quote your out-of-pocket cost based on that information.  [employer] will also be able to set up a 42-month, interest-free payment plan if needed.  Applying the monitor   Shave hair from upper left chest.  Hold abrader disc by orange tab. Rub abrader in 40 strokes over the upper left chest as  indicated in your monitor instructions.  Clean area with 4 enclosed alcohol pads. Let dry.  Apply patch as indicated in monitor instructions. Patch will be placed under collarbone on left  side of chest with arrow pointing upward.  Rub patch adhesive wings for 2 minutes. Remove white label marked "1". Remove the white  label marked "2". Rub patch adhesive wings for 2 additional minutes.  While looking in a mirror, press and release button in center of patch. A small green light will  flash 3-4 times. This will be your only indicator that the monitor has been turned on.  Do not shower for the first 24 hours. You may shower after the first 24 hours.  Press the button if you feel a symptom. You will hear a small click. Record Date, Time and  Symptom in the Patient Logbook.  When you are ready to remove the patch, follow instructions on the last 2 pages of Patient  Logbook. Stick  patch monitor onto the last page of Patient Logbook.  Place Patient Logbook in the blue and white box. Use locking tab on box and tape box closed  securely. The blue and white box has prepaid postage on it. Please place it in the mailbox as  soon as possible. Your physician should have your test results approximately 7 days after the  monitor has been mailed back to [facility].  Call [place] at [phone] if you have  questions regarding  your ZIO XT patch monitor. Call them immediately if you see an orange light blinking on your  monitor.  If your monitor falls off in less than 4 days, contact our Monitor department at [phone].  If your monitor becomes loose or falls off after 4 days call [employer] at [phone] for  suggestions on securing your monitor   Follow-Up:  [place] HERE AT [place]

## 2021-09-22 ENCOUNTER — Telehealth: Payer: Self-pay | Admitting: *Deleted

## 2021-09-22 ENCOUNTER — Ambulatory Visit (INDEPENDENT_AMBULATORY_CARE_PROVIDER_SITE_OTHER): Payer: 59

## 2021-09-22 DIAGNOSIS — R002 Palpitations: Secondary | ICD-10-CM | POA: Diagnosis not present

## 2021-09-22 NOTE — Progress Notes (Unsigned)
Enrolled for [employer] to mail a ZIO XT long term holter monitor to the patients address on file.   [id number] mailed to patient.  Patient will return to [facility] unused.  Prefers to have [id number] from office inventory applied in office today since she is having symptoms.  [employer] notified to cancel [id number] and assign [id number] to her enrollment.

## 2021-09-22 NOTE — Telephone Encounter (Signed)
-----   Message from Jennefer Bravo sent at 09/22/2021  8:58 AM EDT ----- Regarding: RE: 7 DAY ZIO PER DR. Johney Frame Mailed 6/12  ----- Message ----- From: Nuala Alpha, LPN Sent: [id number]   4:17 PM EDT To: Nuala Alpha, LPN; Shelly A Wells; # Subject: 7 DAY ZIO PER DR. Johney Frame                    Dr. Johney Frame saw Cynthia Figueroa at [facility] clinic.  She ordered a 7 day zio on her.  Please enroll and let me know when you do.  You could also put it on her next week in clinic.   Thanks [employer]

## 2021-09-30 ENCOUNTER — Other Ambulatory Visit: Payer: Self-pay | Admitting: Obstetrics and Gynecology

## 2021-09-30 DIAGNOSIS — Z3689 Encounter for other specified antenatal screening: Secondary | ICD-10-CM

## 2021-10-02 ENCOUNTER — Other Ambulatory Visit: Payer: Self-pay

## 2021-10-03 DIAGNOSIS — R002 Palpitations: Secondary | ICD-10-CM | POA: Diagnosis not present

## 2021-10-13 ENCOUNTER — Ambulatory Visit (HOSPITAL_COMMUNITY): Payer: 59 | Attending: Cardiology

## 2021-10-13 DIAGNOSIS — R002 Palpitations: Secondary | ICD-10-CM | POA: Diagnosis not present

## 2021-10-13 LAB — ECHOCARDIOGRAM COMPLETE
AR max vel: 2.64 cm2
AV Area VTI: 2.48 cm2
AV Area mean vel: 2.37 cm2
AV Mean grad: 6 mmHg
AV Peak grad: 11.7 mmHg
Ao pk vel: 1.71 m/s
Area-P 1/2: 3.62 cm2
S' Lateral: 2.1 cm

## 2021-10-22 ENCOUNTER — Encounter: Payer: Self-pay | Admitting: *Deleted

## 2021-10-28 ENCOUNTER — Ambulatory Visit: Payer: 59 | Attending: Obstetrics and Gynecology

## 2021-10-28 ENCOUNTER — Other Ambulatory Visit: Payer: Self-pay | Admitting: *Deleted

## 2021-10-28 ENCOUNTER — Encounter: Payer: Self-pay | Admitting: *Deleted

## 2021-10-28 ENCOUNTER — Ambulatory Visit: Payer: 59 | Admitting: *Deleted

## 2021-10-28 VITALS — BP 128/69 | HR 74

## 2021-10-28 DIAGNOSIS — O3110X0 Continuing pregnancy after spontaneous abortion of one fetus or more, unspecified trimester, not applicable or unspecified: Secondary | ICD-10-CM

## 2021-10-28 DIAGNOSIS — K219 Gastro-esophageal reflux disease without esophagitis: Secondary | ICD-10-CM | POA: Diagnosis not present

## 2021-10-28 DIAGNOSIS — Z3689 Encounter for other specified antenatal screening: Secondary | ICD-10-CM | POA: Insufficient documentation

## 2021-10-28 DIAGNOSIS — O09813 Supervision of pregnancy resulting from assisted reproductive technology, third trimester: Secondary | ICD-10-CM | POA: Diagnosis not present

## 2021-10-28 DIAGNOSIS — Z23 Encounter for immunization: Secondary | ICD-10-CM | POA: Diagnosis not present

## 2021-10-28 DIAGNOSIS — Z9889 Other specified postprocedural states: Secondary | ICD-10-CM | POA: Diagnosis not present

## 2021-10-28 DIAGNOSIS — E8881 Metabolic syndrome: Secondary | ICD-10-CM | POA: Diagnosis not present

## 2021-10-28 DIAGNOSIS — F419 Anxiety disorder, unspecified: Secondary | ICD-10-CM | POA: Diagnosis not present

## 2021-10-28 DIAGNOSIS — Z8759 Personal history of other complications of pregnancy, childbirth and the puerperium: Secondary | ICD-10-CM | POA: Diagnosis not present

## 2021-10-28 DIAGNOSIS — O09819 Supervision of pregnancy resulting from assisted reproductive technology, unspecified trimester: Secondary | ICD-10-CM | POA: Diagnosis not present

## 2021-10-28 DIAGNOSIS — Z369 Encounter for antenatal screening, unspecified: Secondary | ICD-10-CM | POA: Diagnosis not present

## 2021-10-28 DIAGNOSIS — Z148 Genetic carrier of other disease: Secondary | ICD-10-CM | POA: Diagnosis not present

## 2021-10-29 ENCOUNTER — Other Ambulatory Visit (HOSPITAL_COMMUNITY): Payer: Self-pay

## 2021-10-29 MED ORDER — FERROUS SULFATE 325 (65 FE) MG PO TABS: 325.0000 mg | ORAL_TABLET | Freq: Every day | ORAL | 4 refills | Status: DC

## 2021-10-29 MED ORDER — FERROUS SULFATE 325 (65 FE) MG PO TABS
325.0000 mg | ORAL_TABLET | Freq: Every day | ORAL | 4 refills | Status: DC
  Filled 2021-10-29: qty 30, 30d supply, fill #0

## 2021-10-31 ENCOUNTER — Other Ambulatory Visit: Payer: Self-pay | Admitting: Obstetrics & Gynecology

## 2021-11-14 DIAGNOSIS — Z3483 Encounter for supervision of other normal pregnancy, third trimester: Secondary | ICD-10-CM | POA: Diagnosis not present

## 2021-11-14 DIAGNOSIS — Z3482 Encounter for supervision of other normal pregnancy, second trimester: Secondary | ICD-10-CM | POA: Diagnosis not present

## 2021-11-19 ENCOUNTER — Encounter (INDEPENDENT_AMBULATORY_CARE_PROVIDER_SITE_OTHER): Payer: Self-pay

## 2021-11-26 ENCOUNTER — Other Ambulatory Visit (HOSPITAL_COMMUNITY): Payer: Self-pay

## 2021-11-27 ENCOUNTER — Other Ambulatory Visit: Payer: Self-pay | Admitting: *Deleted

## 2021-11-27 ENCOUNTER — Ambulatory Visit: Payer: 59 | Attending: Obstetrics

## 2021-11-27 ENCOUNTER — Ambulatory Visit: Payer: 59 | Admitting: *Deleted

## 2021-11-27 VITALS — BP 114/67 | HR 87

## 2021-11-27 DIAGNOSIS — D563 Thalassemia minor: Secondary | ICD-10-CM | POA: Diagnosis not present

## 2021-11-27 DIAGNOSIS — O34219 Maternal care for unspecified type scar from previous cesarean delivery: Secondary | ICD-10-CM | POA: Diagnosis not present

## 2021-11-27 DIAGNOSIS — Z6835 Body mass index (BMI) 35.0-35.9, adult: Secondary | ICD-10-CM

## 2021-11-27 DIAGNOSIS — O3110X0 Continuing pregnancy after spontaneous abortion of one fetus or more, unspecified trimester, not applicable or unspecified: Secondary | ICD-10-CM | POA: Insufficient documentation

## 2021-11-27 DIAGNOSIS — O09813 Supervision of pregnancy resulting from assisted reproductive technology, third trimester: Secondary | ICD-10-CM | POA: Diagnosis not present

## 2021-11-27 DIAGNOSIS — E669 Obesity, unspecified: Secondary | ICD-10-CM | POA: Diagnosis not present

## 2021-11-27 DIAGNOSIS — Z3A32 32 weeks gestation of pregnancy: Secondary | ICD-10-CM

## 2021-11-27 DIAGNOSIS — O285 Abnormal chromosomal and genetic finding on antenatal screening of mother: Secondary | ICD-10-CM | POA: Diagnosis not present

## 2021-11-27 DIAGNOSIS — O3110X1 Continuing pregnancy after spontaneous abortion of one fetus or more, unspecified trimester, fetus 1: Secondary | ICD-10-CM | POA: Diagnosis not present

## 2021-11-27 DIAGNOSIS — Z8759 Personal history of other complications of pregnancy, childbirth and the puerperium: Secondary | ICD-10-CM

## 2021-11-27 DIAGNOSIS — O99213 Obesity complicating pregnancy, third trimester: Secondary | ICD-10-CM

## 2021-11-27 DIAGNOSIS — O09293 Supervision of pregnancy with other poor reproductive or obstetric history, third trimester: Secondary | ICD-10-CM

## 2021-12-09 NOTE — Progress Notes (Deleted)
Cardio-Obstetrics Clinic  Follow-up:  Date:  12/09/2021   ID:  Cynthia Figueroa, DOB [birth year], MRN [id number]  PCP:  Pcp, No   [facility] Providers Cardiologist:  None  Electrophysiologist:  None    Referring MD: No ref. provider found   Chief Complaint: palpitations  History of Present Illness:    Cynthia Figueroa is a 36 y.o. female [G2P1001] who presents for follow-up for palpitaitons.  Was last seen in clinic on 09/19/21 where she was [redacted] weeks pregnant. Was having palpitations at that time. We obtained cardiac monitor which showed NSR, rare ectopy, 1 run of SVT. Symptoms correlated with SVE and SVT. TTE showed EF 65-70%, no significant valve disease, normal strain.  Today, ***   Notably, during first pregnancy, she developed severe hypertension, HA and changes in her vision at 39 weeks and was told she had pre-eclampsia. She was delivered that day. She had post-partum HTN and required labetalol for about 2 weeks. Her blood pressure since normalized. She is currently on ASA 81mg  daily and BP is well controlled.    Prior CV Studies Reviewed: The following studies were reviewed today: Cardiac monitor 10/03/21: Patch wear time was 7 days and 2 hours Predominant rhythm was NSR with average HR 86bpm There was one run of SVT lasting 10 beats with max HR 176bpm (correlates with patient triggered event) Rare SVE (<1%), rare VE (<1%) Patient triggered events correlate with SVT and SVE No sustained arrhythmias or significant pauses  TTE 10/13/21: IMPRESSIONS     1. Left ventricular ejection fraction, by estimation, is 65 to 70%. The  left ventricle has normal function. The left ventricle has no regional  wall motion abnormalities. Left ventricular diastolic parameters were  normal. The average left ventricular  global longitudinal strain is -23.8 %.   2. Right ventricular systolic function is normal. The right ventricular  size is normal. There is normal pulmonary artery  systolic pressure. The  estimated right ventricular systolic pressure is 26.2 mmHg.   3. The mitral valve is normal in structure. Trivial mitral valve  regurgitation. No evidence of mitral stenosis.   4. The aortic valve was not well visualized. Aortic valve regurgitation  is not visualized. No aortic stenosis is present.   5. The inferior vena cava is normal in size with greater than 50%  respiratory variability, suggesting right atrial pressure of 3 mmHg.  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 09/02/2019 (GYN)   Vaginospasm    Vanishing twin syndrome    Vitamin D deficiency     Past Surgical History:  Procedure Laterality Date   CESAREAN SECTION N/A 12/04/2019   Procedure: CESAREAN SECTION;  Surgeon: 12/06/2019, MD;  Location: [facility];  Service: Obstetrics;  Laterality: N/A;      OB History     Gravida  2   Para  1   Term  1   Preterm  0   AB  0   Living  1      SAB  0   IAB  0   Ectopic  0   Multiple  0   Live Births  1               Current Medications: No outpatient medications have been marked as taking for the 12/12/21 encounter (Appointment) with Meriam Sprague, MD.     Allergies:   Lamisil [terbinafine]   Social History   Socioeconomic History   Marital status: Married    Spouse name: Rip Harbour   Number of children: Not on file   Years of education: Not on file   Highest education level: Not on file  Occupational History   Occupation: CMA  Tobacco Use   Smoking status: Never   Smokeless tobacco: Never  Substance and Sexual Activity   Alcohol use: No   Drug use: No    Sexual activity: Not Currently  Other Topics Concern   Not on file  Social History Narrative   Not on file   Social Determinants of Health   Financial Resource Strain: Not on file  Food Insecurity: Not on file  Transportation Needs: Not on file  Physical Activity: Not on file  Stress: Not on file  Social Connections: Not on file      Family History  Problem Relation Age of Onset   Anemia Mother    High blood pressure Mother    Thyroid disease Mother    Obesity Mother    Healthy Father    Anemia Sister    Diabetes Maternal Grandmother    Alzheimer's disease Maternal Grandmother    Heart failure Maternal Grandmother    Thyroid disease Maternal Grandmother    Cancer Maternal Grandfather    Cancer Paternal Grandmother    Cancer Paternal Grandfather       ROS:   Please see the history of present illness.     Review of Systems  Respiratory:  Positive for shortness of breath.   Cardiovascular:  Positive for palpitations. Negative for chest pain, orthopnea, claudication, leg swelling and PND.  Neurological:  Negative for dizziness and loss of consciousness.      Labs/EKG Reviewed:    EKG:   EKG is  ordered today.  The ekg ordered today demonstrates NSR with HR 75  Recent Labs: 01/14/2021: ALT 11; BUN 8; Creatinine, Ser 0.89; Hemoglobin 12.4; Platelets 428; Potassium 4.1; Sodium 140   Recent Lipid Panel Lab Results  Component Value Date/Time   CHOL 160 01/14/2021 02:57 PM   TRIG 75 01/14/2021 02:57 PM   HDL 34 (L) 01/14/2021 02:57 PM   CHOLHDL 4.7 (H) 01/14/2021 02:57 PM   LDLCALC 111 (H) 01/14/2021 02:57 PM    Physical Exam:    VS:  LMP 04/14/2021     Wt Readings from Last 3 Encounters:  09/19/21 189 lb (85.7 kg)  01/14/21 194 lb (88 kg)  12/14/19 178 lb (80.7 kg)     GEN:  Well nourished, well developed in no acute distress HEENT: Normal NECK: No JVD; No carotid bruits CARDIAC: RRR, no murmurs, rubs, gallops RESPIRATORY:  Clear to auscultation  without rales, wheezing or rhonchi  ABDOMEN: Gravid, NTTP MUSCULOSKELETAL:  No edema; No deformity  SKIN: Warm and dry NEUROLOGIC:  Alert and oriented x 3 PSYCHIATRIC:  Normal affect    Risk Assessment/Risk Calculators:     ASSESSMENT & PLAN:    #Palpitations: Cardiac monitor reassuring with rare ectopy and 1 run nonsustained SVT. Symptoms correlated with SVE and SVT. TTE with normal BiV function and no significant valve disease. Managing conservatively.  -Cardiac monitor and TTE reassuring -Declined BB  at this time -Increase hydration -Small frequent meals -PO mag at night  #History of Pre-Eclampsia: Developed at 39 weeks during last pregnancy and was delivered. Had postpartum HTN requiring labetalol for 2 weeks but then since resolved. Currently, BP is well controlled. Currently on ASA for ppx.  -Continue ASA  81mg  daily -Continue close monitoring of BP during pregnancy  There are no Patient Instructions on file for this visit.    Dispo:  No follow-ups on file.   Medication Adjustments/Labs and Tests Ordered: Current medicines are reviewed at length with the patient today.  Concerns regarding medicines are outlined above.  Tests Ordered: No orders of the defined types were placed in this encounter.  Medication Changes: No orders of the defined types were placed in this encounter.

## 2021-12-12 ENCOUNTER — Ambulatory Visit: Payer: 59 | Admitting: Cardiology

## 2021-12-25 ENCOUNTER — Ambulatory Visit: Payer: 59 | Admitting: *Deleted

## 2021-12-25 ENCOUNTER — Ambulatory Visit: Payer: 59 | Attending: Maternal & Fetal Medicine

## 2021-12-25 ENCOUNTER — Other Ambulatory Visit: Payer: Self-pay | Admitting: *Deleted

## 2021-12-25 ENCOUNTER — Other Ambulatory Visit: Payer: Self-pay | Admitting: Maternal & Fetal Medicine

## 2021-12-25 VITALS — BP 122/72 | HR 82

## 2021-12-25 DIAGNOSIS — Z8759 Personal history of other complications of pregnancy, childbirth and the puerperium: Secondary | ICD-10-CM

## 2021-12-25 DIAGNOSIS — O99213 Obesity complicating pregnancy, third trimester: Secondary | ICD-10-CM

## 2021-12-25 DIAGNOSIS — O3110X0 Continuing pregnancy after spontaneous abortion of one fetus or more, unspecified trimester, not applicable or unspecified: Secondary | ICD-10-CM | POA: Insufficient documentation

## 2021-12-25 DIAGNOSIS — O34219 Maternal care for unspecified type scar from previous cesarean delivery: Secondary | ICD-10-CM

## 2021-12-25 DIAGNOSIS — O09813 Supervision of pregnancy resulting from assisted reproductive technology, third trimester: Secondary | ICD-10-CM | POA: Insufficient documentation

## 2021-12-25 DIAGNOSIS — O365930 Maternal care for other known or suspected poor fetal growth, third trimester, not applicable or unspecified: Secondary | ICD-10-CM

## 2021-12-26 DIAGNOSIS — Z369 Encounter for antenatal screening, unspecified: Secondary | ICD-10-CM | POA: Diagnosis not present

## 2022-01-01 ENCOUNTER — Encounter (HOSPITAL_COMMUNITY): Payer: Self-pay

## 2022-01-01 NOTE — Patient Instructions (Addendum)
Cynthia Figueroa  01/01/2022   Your procedure is scheduled on:  01/15/2022  Arrive at 82 at [employer] [facility] on [address] at [facility]  and [employer]. You are invited to use the FREE valet parking or use the Visitor's parking deck.  Pick up the phone at the desk and dial [phone].  Call this number if you have problems the morning of surgery: [phone]  Remember:   Do not eat food:(After Midnight) Desps de medianoche.  Do not drink clear liquids: (After Midnight) Desps de medianoche.  Take these medicines the morning of surgery with A SIP OF WATER:  May take metaclopromide as prescribed   Do not wear jewelry, make-up or nail polish.  Do not wear lotions, powders, or perfumes. Do not wear deodorant.  Do not shave 48 hours prior to surgery.  Do not bring valuables to the hospital.  [facility] is not   responsible for any belongings or valuables brought to the hospital.  Contacts, dentures or bridgework may not be worn into surgery.  Leave suitcase in the car. After surgery it may be brought to your room.  For patients admitted to the hospital, checkout time is 11:00 AM the day of              discharge.      Please read over the following fact sheets that you were given:     Preparing for Surgery

## 2022-01-02 ENCOUNTER — Ambulatory Visit: Payer: 59 | Admitting: *Deleted

## 2022-01-02 ENCOUNTER — Ambulatory Visit: Payer: 59 | Attending: Obstetrics and Gynecology

## 2022-01-02 VITALS — BP 118/73 | HR 73

## 2022-01-02 DIAGNOSIS — O365930 Maternal care for other known or suspected poor fetal growth, third trimester, not applicable or unspecified: Secondary | ICD-10-CM

## 2022-01-08 DIAGNOSIS — E8881 Metabolic syndrome: Secondary | ICD-10-CM | POA: Diagnosis not present

## 2022-01-08 DIAGNOSIS — Z3A38 38 weeks gestation of pregnancy: Secondary | ICD-10-CM | POA: Diagnosis not present

## 2022-01-08 DIAGNOSIS — Z369 Encounter for antenatal screening, unspecified: Secondary | ICD-10-CM | POA: Diagnosis not present

## 2022-01-08 DIAGNOSIS — O99019 Anemia complicating pregnancy, unspecified trimester: Secondary | ICD-10-CM | POA: Diagnosis not present

## 2022-01-08 DIAGNOSIS — N942 Vaginismus: Secondary | ICD-10-CM | POA: Diagnosis not present

## 2022-01-08 DIAGNOSIS — N979 Female infertility, unspecified: Secondary | ICD-10-CM | POA: Diagnosis not present

## 2022-01-08 DIAGNOSIS — Z8759 Personal history of other complications of pregnancy, childbirth and the puerperium: Secondary | ICD-10-CM | POA: Diagnosis not present

## 2022-01-08 DIAGNOSIS — O09819 Supervision of pregnancy resulting from assisted reproductive technology, unspecified trimester: Secondary | ICD-10-CM | POA: Diagnosis not present

## 2022-01-08 DIAGNOSIS — F419 Anxiety disorder, unspecified: Secondary | ICD-10-CM | POA: Diagnosis not present

## 2022-01-09 ENCOUNTER — Ambulatory Visit: Payer: 59 | Admitting: *Deleted

## 2022-01-09 ENCOUNTER — Ambulatory Visit: Payer: 59 | Attending: Obstetrics and Gynecology

## 2022-01-09 VITALS — BP 121/73 | HR 72

## 2022-01-09 DIAGNOSIS — O365930 Maternal care for other known or suspected poor fetal growth, third trimester, not applicable or unspecified: Secondary | ICD-10-CM

## 2022-01-13 ENCOUNTER — Encounter (HOSPITAL_COMMUNITY)
Admission: RE | Admit: 2022-01-13 | Discharge: 2022-01-13 | Disposition: A | Discharge status: Home / Self Care | Payer: 59 | Source: Ambulatory Visit | Attending: Obstetrics & Gynecology | Admitting: Obstetrics & Gynecology

## 2022-01-13 DIAGNOSIS — E282 Polycystic ovarian syndrome: Secondary | ICD-10-CM | POA: Diagnosis present

## 2022-01-13 DIAGNOSIS — O365930 Maternal care for other known or suspected poor fetal growth, third trimester, not applicable or unspecified: Secondary | ICD-10-CM | POA: Diagnosis not present

## 2022-01-13 DIAGNOSIS — D649 Anemia, unspecified: Secondary | ICD-10-CM | POA: Diagnosis not present

## 2022-01-13 DIAGNOSIS — D62 Acute posthemorrhagic anemia: Secondary | ICD-10-CM | POA: Diagnosis not present

## 2022-01-13 DIAGNOSIS — O3483 Maternal care for other abnormalities of pelvic organs, third trimester: Secondary | ICD-10-CM | POA: Diagnosis not present

## 2022-01-13 DIAGNOSIS — E669 Obesity, unspecified: Secondary | ICD-10-CM | POA: Diagnosis not present

## 2022-01-13 DIAGNOSIS — O365931 Maternal care for other known or suspected poor fetal growth, third trimester, fetus 1: Secondary | ICD-10-CM | POA: Diagnosis not present

## 2022-01-13 DIAGNOSIS — O99284 Endocrine, nutritional and metabolic diseases complicating childbirth: Secondary | ICD-10-CM | POA: Diagnosis not present

## 2022-01-13 DIAGNOSIS — D563 Thalassemia minor: Secondary | ICD-10-CM | POA: Diagnosis present

## 2022-01-13 DIAGNOSIS — O34219 Maternal care for unspecified type scar from previous cesarean delivery: Secondary | ICD-10-CM | POA: Diagnosis not present

## 2022-01-13 DIAGNOSIS — Z23 Encounter for immunization: Secondary | ICD-10-CM | POA: Diagnosis not present

## 2022-01-13 DIAGNOSIS — O99344 Other mental disorders complicating childbirth: Secondary | ICD-10-CM | POA: Diagnosis not present

## 2022-01-13 DIAGNOSIS — Z98891 History of uterine scar from previous surgery: Secondary | ICD-10-CM | POA: Diagnosis not present

## 2022-01-13 DIAGNOSIS — O34211 Maternal care for low transverse scar from previous cesarean delivery: Secondary | ICD-10-CM | POA: Diagnosis not present

## 2022-01-13 DIAGNOSIS — F32A Depression, unspecified: Secondary | ICD-10-CM | POA: Diagnosis not present

## 2022-01-13 DIAGNOSIS — O9081 Anemia of the puerperium: Secondary | ICD-10-CM | POA: Diagnosis not present

## 2022-01-13 DIAGNOSIS — Z3A39 39 weeks gestation of pregnancy: Secondary | ICD-10-CM | POA: Diagnosis not present

## 2022-01-13 DIAGNOSIS — N736 Female pelvic peritoneal adhesions (postinfective): Secondary | ICD-10-CM | POA: Diagnosis not present

## 2022-01-13 DIAGNOSIS — O99214 Obesity complicating childbirth: Secondary | ICD-10-CM | POA: Diagnosis not present

## 2022-01-13 DIAGNOSIS — F419 Anxiety disorder, unspecified: Secondary | ICD-10-CM | POA: Diagnosis present

## 2022-01-13 DIAGNOSIS — O9902 Anemia complicating childbirth: Secondary | ICD-10-CM | POA: Diagnosis not present

## 2022-01-13 LAB — CBC
HCT: 33.9 % — ABNORMAL LOW (ref 36.0–46.0)
Hemoglobin: 10.7 g/dL — ABNORMAL LOW (ref 12.0–15.0)
MCH: 29.1 pg (ref 26.0–34.0)
MCHC: 31.6 g/dL (ref 30.0–36.0)
MCV: 92.1 fL (ref 80.0–100.0)
Platelets: 271 10*3/uL (ref 150–400)
RBC: 3.68 MIL/uL — ABNORMAL LOW (ref 3.87–5.11)
RDW: 13.2 % (ref 11.5–15.5)
WBC: 9.6 10*3/uL (ref 4.0–10.5)
nRBC: 0 % (ref 0.0–0.2)

## 2022-01-13 LAB — RPR: RPR Ser Ql: NONREACTIVE

## 2022-01-13 LAB — BASIC METABOLIC PANEL
Anion gap: 7 (ref 5–15)
BUN: 5 mg/dL — ABNORMAL LOW (ref 6–20)
CO2: 22 mmol/L (ref 22–32)
Calcium: 8.4 mg/dL — ABNORMAL LOW (ref 8.9–10.3)
Chloride: 107 mmol/L (ref 98–111)
Creatinine, Ser: 0.68 mg/dL (ref 0.44–1.00)
GFR, Estimated: >60 mL/min (ref >60)
Glucose, Bld: 87 mg/dL (ref 70–99)
Potassium: 3.8 mmol/L (ref 3.5–5.1)
Sodium: 136 mmol/L (ref 135–145)

## 2022-01-13 LAB — TYPE AND SCREEN
ABO/RH(D): O POS
Antibody Screen: NEGATIVE

## 2022-01-14 NOTE — H&P (Signed)
Cynthia Figueroa is a 36 y.o. female presenting for a repeat Cesarean section.  [id number] at [redacted] weeks EGA with an EDD of 01/22/22 by date of embryo transfer.    Prenatal course significant for: IVF pregnancy: two embryos transferred, was an initial  Di/di pregnancy with subsequent vanishing twin syndrome noted in first trimester.   IUGR noted in third trimester, EFW on 12/25/21: 5lb 4 oz (12th%). AC 8th%.  Abnormal panorama with high risk triploidy (likely due to vanishing twin pregnancy) and with negative AFP4 and horizon testing.      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 Mancel Bale (GYN)   Vaginospasm    Vanishing twin syndrome    Vitamin D deficiency    Past Surgical History:  Procedure Laterality Date   CESAREAN SECTION N/A 12/04/2019   Procedure: CESAREAN SECTION;  Surgeon: Waymon Amato, MD;  Location: [facility] LD ORS;  Service: Obstetrics;  Laterality: N/A;   Family History: family history includes Alzheimer's disease in her maternal grandmother; Anemia in her mother and sister; Cancer in her maternal grandfather, paternal grandfather, and paternal grandmother; Diabetes in her maternal grandmother; Healthy in her father; Heart failure in her maternal grandmother; High blood pressure in her mother; Obesity in her mother; Thyroid disease in her maternal grandmother and mother. Social History:  reports that she has never smoked. She has never used smokeless tobacco. She  reports that she does not drink alcohol and does not use drugs.     Maternal Diabetes: No Genetic Screening: Abnormal:  Results: Other: Maternal Ultrasounds/Referrals: IUGR Fetal Ultrasounds or other Referrals:  None Maternal Substance Abuse:  No Significant Maternal Medications:  None Significant Maternal Lab Results:  Group B Strep negative Number of Prenatal Visits:greater than 3 verified prenatal visits Other Comments:  None  ROS  Constitutional: Denies fevers/chills Cardiovascular: Denies chest pain or palpitations Pulmonary: Denies coughing or wheezing Gastrointestinal: Denies nausea, vomiting or diarrhea Genitourinary: Denies pelvic pain, unusual vaginal bleeding, unusual vaginal discharge, dysuria, urgency or frequency.  Musculoskeletal: Denies muscle or joint aches and pain.  Neurology: Denies abnormal sensations such as tingling or numbness.  Review of Systems    Physical Exam  Blood pressure 119/80, pulse 75, temperature 98.1 F (36.7 C), temperature source Oral, resp. rate 18, height 5\' 1"  (1.549 m), weight 90.8 kg, last menstrual period 04/14/2021, SpO2 98 %, currently breastfeeding.  Constitutional: She is oriented to person, place, and time. She appears well-developed and well-nourished.  HENT:  Head: Normocephalic and atraumatic.  Neck: Normal range of motion.  Cardiovascular: Normal rate, regular rhythm and normal heart sounds.   Respiratory: Effort normal and breath sounds normal.  GI: Soft. Bowel sounds are normal. Genitorurinary: Gravid  uterus, appropriate for gestational age.   EFW by Le[place]olds 6.5 lbs lbs.    Neurological: She is alert and oriented to person, place, and time.  Skin: Skin is warm and dry.  Psychiatric: She has a normal mood and affect. Her behavior is normal.    Prenatal labs: ABO, Rh: --/--/O POS (10/03 2585) Antibody: NEG (10/03 0917) Rubella: Immune (03/30 0000) RPR: NON REACTIVE (10/03 0917)  HBsAg: Negative (03/30 0000)  HIV:  Non-reactive (03/30 0000)  GBS:  Negative  Recent Results (from the past 2160 hour(s))  RPR     Status: None   Collection Time: 01/13/22  9:17 AM  Result Value Ref [place]   RPR Ser Ql NON REACTIVE NON REACTIVE    Comment: Performed at [facility], [address] [place], [place] [address]  Type and screen     Status: None   Collection Time: 01/13/22  9:17 AM  Result Value Ref [place]   ABO/RH(D) O POS    Antibody Screen NEG    Sample Expiration      01/16/2022,2359 Performed at [place] [facility], [address], [place], [place] [address]   CBC     Status: Abnormal   Collection Time: 01/13/22  9:17 AM  Result Value Ref [place]   WBC 9.6 4.0 - 10.5 K/uL   RBC 3.68 (L) 3.87 - 5.11 MIL/uL   Hemoglobin 10.7 (L) 12.0 - 15.0 g/dL   HCT 61.4 (L) 43.1 - 54.0 %   MCV 92.1 80.0 - 100.0 fL   MCH 29.1 26.0 - 34.0 pg   MCHC 31.6 30.0 - 36.0 g/dL   RDW 08.6 76.1 - 95.0 %   Platelets 271 150 - 400 K/uL   nRBC 0.0 0.0 - 0.2 %    Comment: Performed at [facility], [address] [place], [place] [address]  Basic metabolic panel     Status: Abnormal   Collection Time: 01/13/22  9:17 AM  Result Value Ref [place]   Sodium 136 135 - 145 mmol/L   Potassium 3.8 3.5 - 5.1 mmol/L   Chloride 107 98 - 111 mmol/L   CO2 22 22 - 32 mmol/L   Glucose, Bld 87 70 - 99 mg/dL    Comment: Glucose reference range applies only to samples taken after fasting for at least 8 hours.   BUN 5 (L) 6 - 20 mg/dL   Creatinine, Ser 1.24 0.44 - 1.00 mg/dL   Calcium 8.4 (L) 8.9 - 10.3 mg/dL   GFR, Estimated >58 >09 mL/min    Comment: (NOTE) Calculated using the CKD-EPI Creatinine Equation (2021)    Anion gap 7 5 - 15    Comment: Performed at [facility], [address] [place], [place] [address]     Assessment/Plan: 36 y/o Para 1 at [redacted] weeks EGA, here for a repeat cesarean delivery,  - This procedure has been fully reviewed with the patient and written informed consent has been obtained.   -  Admit to [facility] Delivery OR. - Admit orders as entered.   Prescilla Sours, MD.  01/14/2022, 10:14 PM

## 2022-01-15 ENCOUNTER — Inpatient Hospital Stay (HOSPITAL_COMMUNITY): Payer: 59 | Admitting: Anesthesiology

## 2022-01-15 ENCOUNTER — Other Ambulatory Visit: Payer: Self-pay

## 2022-01-15 ENCOUNTER — Inpatient Hospital Stay (HOSPITAL_COMMUNITY)
Admission: RE | Admit: 2022-01-15 | Discharge: 2022-01-18 | DRG: 787 | Disposition: A | Discharge status: Home / Self Care | Payer: 59 | Attending: Obstetrics & Gynecology | Admitting: Obstetrics & Gynecology

## 2022-01-15 ENCOUNTER — Encounter (HOSPITAL_COMMUNITY): Payer: Self-pay | Admitting: Obstetrics & Gynecology

## 2022-01-15 ENCOUNTER — Encounter (HOSPITAL_COMMUNITY)
Admission: RE | Disposition: A | Discharge status: Home / Self Care | Payer: Self-pay | Source: Home / Self Care | Attending: Obstetrics & Gynecology

## 2022-01-15 DIAGNOSIS — O99344 Other mental disorders complicating childbirth: Secondary | ICD-10-CM | POA: Diagnosis present

## 2022-01-15 DIAGNOSIS — O99284 Endocrine, nutritional and metabolic diseases complicating childbirth: Secondary | ICD-10-CM | POA: Diagnosis present

## 2022-01-15 DIAGNOSIS — D563 Thalassemia minor: Secondary | ICD-10-CM | POA: Diagnosis present

## 2022-01-15 DIAGNOSIS — O9081 Anemia of the puerperium: Secondary | ICD-10-CM | POA: Diagnosis not present

## 2022-01-15 DIAGNOSIS — O34219 Maternal care for unspecified type scar from previous cesarean delivery: Secondary | ICD-10-CM

## 2022-01-15 DIAGNOSIS — O99214 Obesity complicating childbirth: Secondary | ICD-10-CM | POA: Diagnosis not present

## 2022-01-15 DIAGNOSIS — Z23 Encounter for immunization: Secondary | ICD-10-CM

## 2022-01-15 DIAGNOSIS — O365931 Maternal care for other known or suspected poor fetal growth, third trimester, fetus 1: Secondary | ICD-10-CM | POA: Diagnosis not present

## 2022-01-15 DIAGNOSIS — O9902 Anemia complicating childbirth: Secondary | ICD-10-CM

## 2022-01-15 DIAGNOSIS — Z3A39 39 weeks gestation of pregnancy: Secondary | ICD-10-CM

## 2022-01-15 DIAGNOSIS — O365930 Maternal care for other known or suspected poor fetal growth, third trimester, not applicable or unspecified: Secondary | ICD-10-CM | POA: Diagnosis present

## 2022-01-15 DIAGNOSIS — F419 Anxiety disorder, unspecified: Secondary | ICD-10-CM | POA: Diagnosis present

## 2022-01-15 DIAGNOSIS — E282 Polycystic ovarian syndrome: Secondary | ICD-10-CM | POA: Diagnosis present

## 2022-01-15 DIAGNOSIS — O34211 Maternal care for low transverse scar from previous cesarean delivery: Principal | ICD-10-CM | POA: Diagnosis present

## 2022-01-15 DIAGNOSIS — D649 Anemia, unspecified: Secondary | ICD-10-CM

## 2022-01-15 DIAGNOSIS — F32A Depression, unspecified: Secondary | ICD-10-CM | POA: Diagnosis present

## 2022-01-15 DIAGNOSIS — E669 Obesity, unspecified: Secondary | ICD-10-CM | POA: Diagnosis not present

## 2022-01-15 DIAGNOSIS — Z98891 History of uterine scar from previous surgery: Secondary | ICD-10-CM

## 2022-01-15 DIAGNOSIS — D62 Acute posthemorrhagic anemia: Secondary | ICD-10-CM | POA: Diagnosis not present

## 2022-01-15 HISTORY — DX: History of uterine scar from previous surgery: Z98.891

## 2022-01-15 LAB — CBC
HCT: 30.1 % — ABNORMAL LOW (ref 36.0–46.0)
Hemoglobin: 9.8 g/dL — ABNORMAL LOW (ref 12.0–15.0)
MCH: 29.4 pg (ref 26.0–34.0)
MCHC: 32.6 g/dL (ref 30.0–36.0)
MCV: 90.4 fL (ref 80.0–100.0)
Platelets: 250 10*3/uL (ref 150–400)
RBC: 3.33 MIL/uL — ABNORMAL LOW (ref 3.87–5.11)
RDW: 13 % (ref 11.5–15.5)
WBC: 15.8 10*3/uL — ABNORMAL HIGH (ref 4.0–10.5)
nRBC: 0 % (ref 0.0–0.2)

## 2022-01-15 SURGERY — Surgical Case
Anesthesia: Spinal

## 2022-01-15 MED ORDER — NALOXONE HCL 4 MG/10ML IJ SOLN: 1.0000 ug/kg/h | INTRAVENOUS | Status: DC | PRN

## 2022-01-15 MED ORDER — FENTANYL CITRATE (PF) 100 MCG/2ML IJ SOLN
INTRAMUSCULAR | Status: DC | PRN
  Administered 2022-01-15: 45 ug via INTRAVENOUS

## 2022-01-15 MED ORDER — DIPHENHYDRAMINE HCL 25 MG PO CAPS: 25.0000 mg | ORAL_CAPSULE | Freq: Four times a day (QID) | ORAL | Status: DC | PRN

## 2022-01-15 MED ORDER — SIMETHICONE 80 MG PO CHEW
80.0000 mg | CHEWABLE_TABLET | Freq: Three times a day (TID) | ORAL | Status: DC
  Administered 2022-01-16 – 2022-01-18 (×6): 80 mg via ORAL
  Filled 2022-01-15 (×7): qty 1

## 2022-01-15 MED ORDER — DEXAMETHASONE SODIUM PHOSPHATE 10 MG/ML IJ SOLN
INTRAMUSCULAR | Status: DC | PRN
  Administered 2022-01-15: 8 mg via INTRAVENOUS

## 2022-01-15 MED ORDER — ONDANSETRON HCL 4 MG/2ML IJ SOLN: 4.0000 mg | Freq: Three times a day (TID) | INTRAMUSCULAR | Status: DC | PRN

## 2022-01-15 MED ORDER — PROMETHAZINE HCL 25 MG/ML IJ SOLN: 6.2500 mg | INTRAMUSCULAR | Status: DC | PRN

## 2022-01-15 MED ORDER — SERTRALINE HCL 50 MG PO TABS
50.0000 mg | ORAL_TABLET | Freq: Every day | ORAL | Status: DC
  Administered 2022-01-15 – 2022-01-16 (×2): 50 mg via ORAL
  Filled 2022-01-15 (×3): qty 1

## 2022-01-15 MED ORDER — OXYTOCIN-SODIUM CHLORIDE 30-0.9 UT/500ML-% IV SOLN
2.5000 [IU]/h | INTRAVENOUS | Status: AC
  Administered 2022-01-15: 2.5 [IU]/h via INTRAVENOUS
  Filled 2022-01-15: qty 500

## 2022-01-15 MED ORDER — WITCH HAZEL-GLYCERIN EX PADS: 1.0000 | MEDICATED_PAD | CUTANEOUS | Status: DC | PRN

## 2022-01-15 MED ORDER — MEPERIDINE HCL 25 MG/ML IJ SOLN: 6.2500 mg | INTRAMUSCULAR | Status: DC | PRN

## 2022-01-15 MED ORDER — FENTANYL CITRATE (PF) 100 MCG/2ML IJ SOLN
INTRAMUSCULAR | Status: AC
  Filled 2022-01-15: qty 2

## 2022-01-15 MED ORDER — LACTATED RINGERS IV SOLN: INTRAVENOUS | Status: DC

## 2022-01-15 MED ORDER — FENTANYL CITRATE (PF) 100 MCG/2ML IJ SOLN: 25.0000 ug | INTRAMUSCULAR | Status: DC | PRN

## 2022-01-15 MED ORDER — OXYTOCIN-SODIUM CHLORIDE 30-0.9 UT/500ML-% IV SOLN
INTRAVENOUS | Status: AC
  Filled 2022-01-15: qty 500

## 2022-01-15 MED ORDER — OXYCODONE HCL 5 MG PO TABS: 5.0000 mg | ORAL_TABLET | Freq: Once | ORAL | Status: DC | PRN

## 2022-01-15 MED ORDER — ACETAMINOPHEN 10 MG/ML IV SOLN
INTRAVENOUS | Status: AC
  Filled 2022-01-15: qty 100

## 2022-01-15 MED ORDER — STERILE WATER FOR IRRIGATION IR SOLN
Status: DC | PRN
  Administered 2022-01-15: 1000 mL

## 2022-01-15 MED ORDER — IBUPROFEN 600 MG PO TABS
600.0000 mg | ORAL_TABLET | Freq: Four times a day (QID) | ORAL | Status: DC
  Administered 2022-01-16 – 2022-01-18 (×6): 600 mg via ORAL
  Filled 2022-01-15 (×7): qty 1

## 2022-01-15 MED ORDER — MAGNESIUM HYDROXIDE 400 MG/5ML PO SUSP: 30.0000 mL | ORAL | Status: DC | PRN

## 2022-01-15 MED ORDER — SIMETHICONE 80 MG PO CHEW
80.0000 mg | CHEWABLE_TABLET | ORAL | Status: DC | PRN
  Administered 2022-01-16: 80 mg via ORAL
  Filled 2022-01-15: qty 1

## 2022-01-15 MED ORDER — OXYCODONE HCL 5 MG/5ML PO SOLN: 5.0000 mg | Freq: Once | ORAL | Status: DC | PRN

## 2022-01-15 MED ORDER — NALOXONE HCL 0.4 MG/ML IJ SOLN: 0.4000 mg | INTRAMUSCULAR | Status: DC | PRN

## 2022-01-15 MED ORDER — DIPHENHYDRAMINE HCL 25 MG PO CAPS: 25.0000 mg | ORAL_CAPSULE | ORAL | Status: DC | PRN

## 2022-01-15 MED ORDER — ACETAMINOPHEN 10 MG/ML IV SOLN
INTRAVENOUS | Status: DC | PRN
  Administered 2022-01-15: 1000 mg via INTRAVENOUS

## 2022-01-15 MED ORDER — POVIDONE-IODINE 10 % EX SWAB
2.0000 | Freq: Once | CUTANEOUS | Status: AC
  Administered 2022-01-15: 2 via TOPICAL

## 2022-01-15 MED ORDER — OXYTOCIN-SODIUM CHLORIDE 30-0.9 UT/500ML-% IV SOLN
INTRAVENOUS | Status: DC | PRN
  Administered 2022-01-15: 400 mL via INTRAVENOUS

## 2022-01-15 MED ORDER — PRENATAL MULTIVITAMIN CH
1.0000 | ORAL_TABLET | Freq: Every day | ORAL | Status: DC
  Filled 2022-01-15: qty 1

## 2022-01-15 MED ORDER — DIBUCAINE (PERIANAL) 1 % EX OINT: 1.0000 | TOPICAL_OINTMENT | CUTANEOUS | Status: DC | PRN

## 2022-01-15 MED ORDER — PHENYLEPHRINE HCL-NACL 20-0.9 MG/250ML-% IV SOLN
INTRAVENOUS | Status: DC | PRN
  Administered 2022-01-15: 60 ug/min via INTRAVENOUS

## 2022-01-15 MED ORDER — PHENYLEPHRINE HCL-NACL 20-0.9 MG/250ML-% IV SOLN
INTRAVENOUS | Status: AC
  Filled 2022-01-15: qty 250

## 2022-01-15 MED ORDER — ZOLPIDEM TARTRATE 5 MG PO TABS: 5.0000 mg | ORAL_TABLET | Freq: Every evening | ORAL | Status: DC | PRN

## 2022-01-15 MED ORDER — SODIUM CHLORIDE 0.9 % IR SOLN
Status: DC | PRN
  Administered 2022-01-15 (×2): 1

## 2022-01-15 MED ORDER — DIPHENHYDRAMINE HCL 50 MG/ML IJ SOLN: 12.5000 mg | Freq: Four times a day (QID) | INTRAMUSCULAR | Status: DC | PRN

## 2022-01-15 MED ORDER — MORPHINE SULFATE (PF) 0.5 MG/ML IJ SOLN
INTRAMUSCULAR | Status: AC
  Filled 2022-01-15: qty 10

## 2022-01-15 MED ORDER — KETOROLAC TROMETHAMINE 30 MG/ML IJ SOLN
30.0000 mg | Freq: Once | INTRAMUSCULAR | Status: AC | PRN
  Administered 2022-01-15: 30 mg via INTRAVENOUS

## 2022-01-15 MED ORDER — PHENYLEPHRINE 80 MCG/ML (10ML) SYRINGE FOR IV PUSH (FOR BLOOD PRESSURE SUPPORT)
PREFILLED_SYRINGE | INTRAVENOUS | Status: AC
  Filled 2022-01-15: qty 10

## 2022-01-15 MED ORDER — FENTANYL CITRATE (PF) 100 MCG/2ML IJ SOLN
INTRAMUSCULAR | Status: DC | PRN
  Administered 2022-01-15: 45 ug via INTRATHECAL
  Administered 2022-01-15: 15 ug via INTRATHECAL

## 2022-01-15 MED ORDER — BUPIVACAINE IN DEXTROSE 0.75-8.25 % IT SOLN
INTRATHECAL | Status: DC | PRN
  Administered 2022-01-15: 1.6 mL via INTRATHECAL

## 2022-01-15 MED ORDER — MORPHINE SULFATE (PF) 0.5 MG/ML IJ SOLN
INTRAMUSCULAR | Status: DC | PRN
  Administered 2022-01-15: 150 ug via INTRATHECAL

## 2022-01-15 MED ORDER — MENTHOL 3 MG MT LOZG: 1.0000 | LOZENGE | OROMUCOSAL | Status: DC | PRN

## 2022-01-15 MED ORDER — KETOROLAC TROMETHAMINE 30 MG/ML IJ SOLN
30.0000 mg | Freq: Four times a day (QID) | INTRAMUSCULAR | Status: AC
  Administered 2022-01-15 – 2022-01-16 (×4): 30 mg via INTRAVENOUS
  Filled 2022-01-15 (×4): qty 1

## 2022-01-15 MED ORDER — COCONUT OIL OIL: 1.0000 | TOPICAL_OIL | Status: DC | PRN

## 2022-01-15 MED ORDER — OXYCODONE HCL 5 MG PO TABS
5.0000 mg | ORAL_TABLET | ORAL | Status: DC | PRN
  Administered 2022-01-17 (×2): 5 mg via ORAL
  Filled 2022-01-15 (×2): qty 2
  Filled 2022-01-15: qty 1

## 2022-01-15 MED ORDER — ONDANSETRON HCL 4 MG/2ML IJ SOLN
INTRAMUSCULAR | Status: AC
  Filled 2022-01-15: qty 2

## 2022-01-15 MED ORDER — FERROUS SULFATE 325 (65 FE) MG PO TABS: 325.0000 mg | ORAL_TABLET | Freq: Two times a day (BID) | ORAL | Status: DC

## 2022-01-15 MED ORDER — ACETAMINOPHEN 500 MG PO TABS
1000.0000 mg | ORAL_TABLET | Freq: Four times a day (QID) | ORAL | Status: DC
  Administered 2022-01-16 – 2022-01-18 (×8): 1000 mg via ORAL
  Filled 2022-01-15 (×10): qty 2

## 2022-01-15 MED ORDER — SCOPOLAMINE 1 MG/3DAYS TD PT72: 1.0000 | MEDICATED_PATCH | Freq: Once | TRANSDERMAL | Status: DC

## 2022-01-15 MED ORDER — DEXMEDETOMIDINE (PRECEDEX) IN NS 20 MCG/5ML (4 MCG/ML) IV SYRINGE
PREFILLED_SYRINGE | INTRAVENOUS | Status: DC | PRN
  Administered 2022-01-15: 8 ug via INTRAVENOUS

## 2022-01-15 MED ORDER — CEFAZOLIN SODIUM-DEXTROSE 2-4 GM/100ML-% IV SOLN
2.0000 g | INTRAVENOUS | Status: AC
  Administered 2022-01-15: 2 g via INTRAVENOUS

## 2022-01-15 MED ORDER — METOCLOPRAMIDE HCL 5 MG/ML IJ SOLN
INTRAMUSCULAR | Status: DC | PRN
  Administered 2022-01-15: 10 mg via INTRAVENOUS

## 2022-01-15 MED ORDER — METOCLOPRAMIDE HCL 5 MG/ML IJ SOLN
INTRAMUSCULAR | Status: AC
  Filled 2022-01-15: qty 2

## 2022-01-15 MED ORDER — PHENYLEPHRINE HCL (PRESSORS) 10 MG/ML IV SOLN
INTRAVENOUS | Status: DC | PRN
  Administered 2022-01-15: 240 ug via INTRAVENOUS

## 2022-01-15 MED ORDER — TRANEXAMIC ACID-NACL 1000-0.7 MG/100ML-% IV SOLN
INTRAVENOUS | Status: AC
  Filled 2022-01-15: qty 100

## 2022-01-15 MED ORDER — CEFAZOLIN SODIUM-DEXTROSE 2-4 GM/100ML-% IV SOLN
INTRAVENOUS | Status: AC
  Filled 2022-01-15: qty 100

## 2022-01-15 MED ORDER — SODIUM CHLORIDE 0.9% FLUSH: 3.0000 mL | INTRAVENOUS | Status: DC | PRN

## 2022-01-15 MED ORDER — ONDANSETRON HCL 4 MG/2ML IJ SOLN
INTRAMUSCULAR | Status: DC | PRN
  Administered 2022-01-15: 4 mg via INTRAVENOUS

## 2022-01-15 MED ORDER — TRANEXAMIC ACID-NACL 1000-0.7 MG/100ML-% IV SOLN
INTRAVENOUS | Status: DC | PRN
  Administered 2022-01-15: 1000 mg via INTRAVENOUS

## 2022-01-15 MED ORDER — HYDROMORPHONE HCL 1 MG/ML IJ SOLN: 0.2000 mg | INTRAMUSCULAR | Status: DC | PRN

## 2022-01-15 MED ORDER — KETOROLAC TROMETHAMINE 30 MG/ML IJ SOLN
INTRAMUSCULAR | Status: AC
  Filled 2022-01-15: qty 1

## 2022-01-15 MED ORDER — DEXAMETHASONE SODIUM PHOSPHATE 4 MG/ML IJ SOLN
INTRAMUSCULAR | Status: AC
  Filled 2022-01-15: qty 2

## 2022-01-15 MED ORDER — SCOPOLAMINE 1 MG/3DAYS TD PT72
MEDICATED_PATCH | TRANSDERMAL | Status: DC | PRN
  Administered 2022-01-15: 1 via TRANSDERMAL

## 2022-01-15 SURGICAL SUPPLY — 42 items
BENZOIN TINCTURE PRP APPL 2/3 (GAUZE/BANDAGES/DRESSINGS) IMPLANT
CHLORAPREP W/TINT 26ML (MISCELLANEOUS) ×2 IMPLANT
CLAMP UMBILICAL CORD (MISCELLANEOUS) ×1 IMPLANT
CLOTH BEACON ORANGE TIMEOUT ST (SAFETY) ×1 IMPLANT
DERMABOND ADVANCED .7 DNX12 (GAUZE/BANDAGES/DRESSINGS) IMPLANT
DRAPE C SECTION CLR SCREEN (DRAPES) ×1 IMPLANT
DRSG OPSITE POSTOP 4X10 (GAUZE/BANDAGES/DRESSINGS) ×1 IMPLANT
ELECT REM PT RETURN 9FT ADLT (ELECTROSURGICAL) ×1
ELECTRODE REM PT RTRN 9FT ADLT (ELECTROSURGICAL) ×1 IMPLANT
EXTRACTOR VACUUM KIWI (MISCELLANEOUS) IMPLANT
EXTRACTOR VACUUM M CUP 4 TUBE (SUCTIONS) IMPLANT
GAUZE SPONGE 4X4 12PLY STRL LF (GAUZE/BANDAGES/DRESSINGS) IMPLANT
GLOVE BIOGEL PI IND STRL 7.0 (GLOVE) ×2 IMPLANT
GLOVE SURG SS PI 6.5 STRL IVOR (GLOVE) ×1 IMPLANT
GOWN STRL REUS W/TWL LRG LVL3 (GOWN DISPOSABLE) ×2 IMPLANT
HEMOSTAT ARISTA ABSORB 3G PWDR (HEMOSTASIS) IMPLANT
KIT ABG SYR 3ML LUER SLIP (SYRINGE) IMPLANT
NDL HYPO 25X5/8 SAFETYGLIDE (NEEDLE) IMPLANT
NEEDLE HYPO 25X5/8 SAFETYGLIDE (NEEDLE) IMPLANT
NS IRRIG 1000ML POUR BTL (IV SOLUTION) ×1 IMPLANT
PACK C SECTION WH (CUSTOM PROCEDURE TRAY) ×1 IMPLANT
PAD ABD 7.5X8 STRL (GAUZE/BANDAGES/DRESSINGS) IMPLANT
PAD OB MATERNITY 4.3X12.25 (PERSONAL CARE ITEMS) ×1 IMPLANT
PENCIL SMOKE EVAC W/HOLSTER (ELECTROSURGICAL) IMPLANT
RTRCTR C-SECT PINK 25CM LRG (MISCELLANEOUS) ×1 IMPLANT
SPONGE GAUZE 2X2 8PLY STRL LF (GAUZE/BANDAGES/DRESSINGS) IMPLANT
STRIP CLOSURE SKIN 1/2X4 (GAUZE/BANDAGES/DRESSINGS) IMPLANT
SUT CHROMIC 0 CT 1 (SUTURE) IMPLANT
SUT CHROMIC 1 CTX 36 (SUTURE) IMPLANT
SUT CHROMIC 2 0 CT 1 (SUTURE) ×1 IMPLANT
SUT MON AB 4-0 PS1 27 (SUTURE) ×1 IMPLANT
SUT PLAIN 1 NONE 54 (SUTURE) IMPLANT
SUT PLAIN 2 0 (SUTURE) ×2
SUT PLAIN 2 0 XLH (SUTURE) IMPLANT
SUT PLAIN ABS 2-0 CT1 27XMFL (SUTURE) IMPLANT
SUT VIC AB 0 CTX 36 (SUTURE) ×2
SUT VIC AB 0 CTX36XBRD ANBCTRL (SUTURE) ×1 IMPLANT
SUT VIC AB 1 CTX 36 (SUTURE) ×3
SUT VIC AB 1 CTX36XBRD ANBCTRL (SUTURE) ×2 IMPLANT
TOWEL OR 17X24 6PK STRL BLUE (TOWEL DISPOSABLE) ×1 IMPLANT
TRAY FOLEY W/BAG SLVR 14FR LF (SET/KITS/TRAYS/PACK) ×1 IMPLANT
WATER STERILE IRR 1000ML POUR (IV SOLUTION) ×1 IMPLANT

## 2022-01-15 NOTE — Transfer of Care (Signed)
Immediate Anesthesia Transfer of Care Note  Patient: Cynthia Figueroa  Procedure(s) Performed: CESAREAN SECTION  Patient Location: PACU  Anesthesia Type:Spinal  Level of Consciousness: awake, alert  and oriented  Airway & Oxygen Therapy: Patient Spontanous Breathing  Post-op Assessment: Report given to RN and Post -op Vital signs reviewed and stable  Post vital signs: Reviewed and stable  Last Vitals:  Vitals Value Taken Time  BP 102/69 01/15/22 1122  Temp    Pulse 69 01/15/22 1124  Resp 15 01/15/22 1124  SpO2 100 % 01/15/22 1124  Vitals shown include unvalidated device data.  Last Pain:  Vitals:   01/15/22 0755  TempSrc: Oral         Complications: No notable events documented.

## 2022-01-15 NOTE — Anesthesia Preprocedure Evaluation (Addendum)
Anesthesia Evaluation  Patient identified by MRN, date of birth, ID band Patient awake    Reviewed: Allergy & Precautions, NPO status , Patient's Chart, lab work & pertinent test results  Airway Mallampati: II  TM Distance: >3 FB Neck ROM: Full    Dental no notable dental hx.    Pulmonary neg pulmonary ROS   Pulmonary exam normal        Cardiovascular negative cardio ROS Normal cardiovascular exam     Neuro/Psych  PSYCHIATRIC DISORDERS Anxiety Depression    negative neurological ROS     GI/Hepatic negative GI ROS, Neg liver ROS,,,  Endo/Other  PCOS (polycystic ovarian syndrome)  Renal/GU negative Renal ROS     Musculoskeletal negative musculoskeletal ROS (+)    Abdominal  (+) + obese  Peds  Hematology  (+) Blood dyscrasia, anemia Thalassemia alpha carrier   Anesthesia Other Findings REPEAT CESAREAN SECTION  Reproductive/Obstetrics (+) Pregnancy                             Anesthesia Physical Anesthesia Plan  ASA: 2  Anesthesia Plan: Spinal   Post-op Pain Management:    Induction:   PONV Risk Score and Plan: 2 and Ondansetron, Dexamethasone and Treatment may vary due to age or medical condition  Airway Management Planned: Natural Airway  Additional Equipment:   Intra-op Plan:   Post-operative Plan:   Informed Consent: I have reviewed the patients History and Physical, chart, labs and discussed the procedure including the risks, benefits and alternatives for the proposed anesthesia with the patient or authorized representative who has indicated his/her understanding and acceptance.     Dental advisory given  Plan Discussed with: CRNA  Anesthesia Plan Comments:         Anesthesia Quick Evaluation

## 2022-01-15 NOTE — Op Note (Addendum)
Patient: Cynthia Figueroa DOB: 09/15/1985 MRN:  [id number]  DATE OF SURGERY: 01/15/2022   PREOP DIAGNOSIS:  1. 39 week 1 day EGA IUP. 2. History or 1 prior cesarean section and desires a repeat cesarean delivery. 3. IUGR noted in third trimester, EFW on 12/25/21: 5lb 4 oz (12th%). AC 8th%.    POSTOP DIAGNOSIS: Same as above and  4. Adhesions between right rectus muscle and right side of uterus.   PROCEDURE:  Repeat low uterine segment transverse cesarean section via Pfannenstiel incision.    Lysis of adhesions.   SURGEON: Dr.  Waymon Amato  ASSISTANT: Ob Fellow- Dr. Gifford Shave   SURGEON ATTESTATION: An experienced assistant was required given the standard of surgical care given the complexity of the case.  This assistant was needed for exposure, dissection, suctioning, retraction, instrument exchange,  assisting with delivery with administration of fundal pressure, and for overall help during the procedure.   ANESTHESIA: Spinal.   COMPLICATIONS: None.   FINDINGS: Viable female infant in cephalic presentation, DOP, weight 6lbs 10.9 oz.   Apgar scores of 9 and 9. Normal uterus and fallopian tubes and ovaries bilaterally.  Dense adhesions between right side of uterus and rectus muscle.     EBL:  1200 cc  IV FLUID:   3600 cc LR   URINE OUTPUT: 250 cc clear urine  INDICATIONS:  36 y/o [id number] at [redacted] weeks EGA who presented for a repeat cesarean section.  She was consented for the procedure after explaining risks benefits and alternatives of the procedure.    PROCEDURE:   She was taken to the operating room where her spinal anesthesia was found to be adequate. She was prepped and draped in the usual sterile fashion and a Foley catheter was placed. She received 2 g of IV Ancef preoperatively.  A Pfannenstiel incision was made with the scalpel over the prior incision and the incision extended through the subcutaneous layer and also the fascia with the bovie. Small perforators in the  subcutaneous layer were contained with the Bovie. The fascia was nicked in the midline and then was further separated from the rectus muscles bilaterally using Mayo scissors. Kochers were placed inferiorly and then superiorly to allow further separation of fascia from the rectus muscles.  Small bleeders on the rectus muscle were contained with the bovie.  The peritoneal cavity was entered bluntly with the fingers. The Alexis retractor was placed in. The bladder peritoneal fold could not be dissected.  Dense adhesions were noted between right side of uterus and rectus muscle.  Adhesions were separated in a limited way with metzenbaum scissors due to dense nature and bleeding encountered during the lysis of adhesions.  The uterus was incised with a scalpel and the incision extended bluntly bilaterally with fingers and bandage scisors.  Membranes were ruptured and moderate clear fluid noted.  The head could not be delivered with head flexion and fundal pressure.  The head was noted to be wedged in, into her posterior pelvis.  The uterus incision was extended with bandage scissors on the left side. Kiwi mushroom vacuum was then applied on fetuses's head.  Vacuum suction was applied to green zone and after two pulls, (which included one pop off) the head then the rest of the body was then delivered with abdominal pressure, position of baby was DOP.   She delivered a viable female infant, apgar scores 9, 9.  The cord was clamped and cut shortly. Cord pH and cord blood were collected.   The  uterus was not exteriorized.  The edges of the uterus was grasped with T clamps. The placenta was delivered with gentle traction on the umbilical cord. The uterus was cleared of clots and debris with a lap.  The uterine incision was closed with #1 Vicryl in a running locked stitch. Uterus was firm.  An imbricating layer of the same stitch was placed over the initial closure.  Small areas that bled on the uterus were contained with  sutures.  Irrigation was applied and suctioned out. Excellent hemostasis was noted over the incision.  Tranexamic acid was given at this point due to the significant total blood loss.   The peritoneum was then reapproximated using chromic suture. Fascia was closed using 0 Vicryl in a running stitch. The subcutaneous layer was irrigated and suctioned out. Small perforators were contained with the bovie.  The subcutaneous layer was closed using 2-0 plain in interrupted stitches. The skin was closed using 3-0 Monocryl.  Benzocaine and steri strips were applied.  Honeycomb and pressure dressings were then applied. The patient was then cleaned and she was taken to the recovery room with her baby in stable conditions.   SPECIMEN: Placenta to pathology, umbilical cord pH and blood to lab.   DISPOSITION: Mother and baby stable to TO PACU.    Dr. Hoover Browns 01/15/2022.

## 2022-01-15 NOTE — Interval H&P Note (Signed)
History and Physical Interval Note:  01/15/2022 8:43 AM  Cynthia Figueroa  has presented today for surgery, with the diagnosis of REPEAT CESAREAN SECTION.  The various methods of treatment have been discussed with the patient and family. After consideration of risks, benefits and other options for treatment, the patient has consented to  Procedure(s): CESAREAN SECTION (N/A) as a surgical intervention.  The patient's history has been reviewed, patient examined, no change in status, stable for surgery.  I have reviewed the patient's chart and labs.  Questions were answered to the patient's satisfaction.     Archie Endo, MD.

## 2022-01-15 NOTE — Anesthesia Procedure Notes (Signed)
Spinal  Patient location during procedure: OR Start time: 01/15/2022 9:10 AM End time: 01/15/2022 9:15 AM Reason for block: surgical anesthesia Staffing Performed: anesthesiologist  Anesthesiologist: Murvin Natal, MD Performed by: Murvin Natal, MD Authorized by: Murvin Natal, MD   Preanesthetic Checklist Completed: patient identified, IV checked, risks and benefits discussed, surgical consent, monitors and equipment checked, pre-op evaluation and timeout performed Spinal Block Patient position: sitting Prep: DuraPrep Patient monitoring: cardiac monitor, continuous pulse ox and blood pressure Approach: midline Location: L4-5 Injection technique: single-shot Needle Needle type: Pencan  Needle gauge: 24 G Needle length: 9 cm Assessment Sensory level: T10 Events: CSF return Additional Notes Functioning IV was confirmed and monitors were applied. Sterile prep and drape, including hand hygiene and sterile gloves were used. The patient was positioned and the spine was prepped. The skin was anesthetized with lidocaine.  Free flow of clear CSF was obtained prior to injecting local anesthetic into the CSF.  The spinal needle aspirated freely following injection.  The needle was carefully withdrawn.  The patient tolerated the procedure well.

## 2022-01-15 NOTE — Anesthesia Postprocedure Evaluation (Signed)
Anesthesia Post Note  Patient: International aid/development worker  Procedure(s) Performed: [place]     Patient location during evaluation: PACU Anesthesia Type: Spinal Level of consciousness: awake Pain management: pain level controlled Vital Signs Assessment: post-procedure vital signs reviewed and stable Respiratory status: spontaneous breathing, nonlabored ventilation, respiratory function stable and patient connected to nasal cannula oxygen Cardiovascular status: stable and blood pressure returned to baseline Postop Assessment: no apparent nausea or vomiting Anesthetic complications: no   No notable events documented.  Last Vitals:  Vitals:   01/15/22 1348 01/15/22 1435  BP: 100/72   Pulse: 71 63  Resp: 16 18  Temp: 36.9 C 36.8 C  SpO2: 100%     Last Pain:  Vitals:   01/15/22 1437  TempSrc:   PainSc: 2    Pain Goal:                   Karyl Kinnier Jesus Poplin

## 2022-01-15 NOTE — Lactation Note (Signed)
This note was copied from a baby's chart. Lactation Consultation Note  Patient Name: Girl Bryant Lipps AESLP'N Date: 01/15/2022   Age:34 hours Declines lactation. Formula feeding. Maternal Data    Feeding Nipple Type: Extra Slow Flow  LATCH Score                    Lactation Tools Discussed/Used    Interventions    Discharge    Consult Status Consult Status: Complete    Danett Palazzo G 01/15/2022, 7:23 PM

## 2022-01-16 ENCOUNTER — Encounter (HOSPITAL_COMMUNITY): Payer: Self-pay | Admitting: Obstetrics & Gynecology

## 2022-01-16 DIAGNOSIS — D62 Acute posthemorrhagic anemia: Secondary | ICD-10-CM | POA: Diagnosis not present

## 2022-01-16 HISTORY — DX: Acute posthemorrhagic anemia: D62

## 2022-01-16 LAB — CBC
HCT: 25.9 % — ABNORMAL LOW (ref 36.0–46.0)
Hemoglobin: 8.4 g/dL — ABNORMAL LOW (ref 12.0–15.0)
MCH: 29.6 pg (ref 26.0–34.0)
MCHC: 32.4 g/dL (ref 30.0–36.0)
MCV: 91.2 fL (ref 80.0–100.0)
Platelets: 253 10*3/uL (ref 150–400)
RBC: 2.84 MIL/uL — ABNORMAL LOW (ref 3.87–5.11)
RDW: 13.2 % (ref 11.5–15.5)
WBC: 15.2 10*3/uL — ABNORMAL HIGH (ref 4.0–10.5)
nRBC: 0 % (ref 0.0–0.2)

## 2022-01-16 MED ORDER — INFLUENZA VAC SPLIT QUAD 0.5 ML IM SUSY
0.5000 mL | PREFILLED_SYRINGE | INTRAMUSCULAR | Status: AC
  Administered 2022-01-18: 0.5 mL via INTRAMUSCULAR
  Filled 2022-01-16: qty 0.5

## 2022-01-16 MED ORDER — POLYSACCHARIDE IRON COMPLEX 150 MG PO CAPS
150.0000 mg | ORAL_CAPSULE | Freq: Every day | ORAL | Status: DC
  Administered 2022-01-16 – 2022-01-17 (×2): 150 mg via ORAL
  Filled 2022-01-16 (×3): qty 1

## 2022-01-16 NOTE — Progress Notes (Addendum)
Subjective: Postpartum Day 1: Cesarean Delivery Patient reports incisional pain, tolerating PO, + flatus, + BM, and no problems voiding.  She denies lightheadedness or dizziness with ambulation.   Objective: Vital signs in last 24 hours: Temp:  [97.8 F (36.6 C)-98.4 F (36.9 C)] 97.8 F (36.6 C) (10/06 0449) Pulse Rate:  [64-74] 74 (10/06 0449) Resp:  [16-17] 16 (10/06 0449) BP: (93-106)/(59-69) 104/62 (10/06 0449) SpO2:  [98 %] 98 % (10/05 1549)  Physical Exam:  General: alert, cooperative, and no distress Lochia: appropriate Uterine Fundus: firm Incision: With pressure dressing.   DVT Evaluation: No evidence of DVT seen on physical exam. Calf/Ankle edema is present.  Recent Labs    01/15/22 1526 01/16/22 0459  HGB 9.8* 8.4*  HCT 30.1* 25.9*   Assessment/Plan: 36 y/o Para 2, Status post repeat Cesarean section, POD # 1. Doing well postoperatively.  Continue current care. With post-op anemia, asymptomatic, will give oral iron.  Archie Endo, MD 01/16/2022, 2:54 PM

## 2022-01-16 NOTE — Progress Notes (Signed)
Cynthia Figueroa [id number] Postpartum Postoperative Day # 2  Cynthia Figueroa, [id number], [redacted]w[redacted]d, S/P pt was admitted on 10/4 with IVF pregnancy, was did with vanishing twin for RCS, h/o GAD, MDD no meds, vaginismus, was taken to the OR with Dr Sallye Ober on 10/5 under spinal epidural, PPH with EBL of 1200, TXA, aresta, and pit given, hgb drop of 10.7-9.8-8.4, asymptomatic and on PO Iron.   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   PCOS (polycystic ovarian syndrome) 03/08/2013     Active Ambulatory Problems    Diagnosis Date Noted   PCOS (polycystic ovarian syndrome) 03/08/2013   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   Vaginismus not due to a substance or known physiological condition 12/04/2019   Depression 12/04/2019   Anxiety 12/04/2019   PIH (pregnancy induced hypertension), third trimester 12/04/2019   Postpartum care following cesarean delivery 8/23 12/04/2019   Cesarean delivery delivered 12/04/2019   Resolved Ambulatory Problems    Diagnosis Date Noted   Goals of care, counseling/discussion 03/08/2013   Hypertension 03/08/2013   Acute blood loss anemia 12/05/2019   Past Medical History:  Diagnosis Date   Depressive disorder    GERD (gastroesophageal reflux disease)    History of cesarean section    History of gestational hypertension    Infertility, female    Newborn product of in vitro fertilization (IVF) pregnancy    Pre-diabetes    Vaginospasm    Vanishing  twin syndrome    Vitamin D deficiency      Subjective: Patient up ad lib, denies syncope or dizziness. Reports consuming regular diet without issues and denies N/V. Patient reports 0 bowel movement + passing flatus.  Denies issues with urination and reports bleeding is "lighter."  Patient is breastfeeding and reports going well.  Desires undecided for postpartum contraception.  Pain is being appropriately managed with use of po meds.    Objective: Patient Vitals for the past 24 hrs:  BP Temp Temp src Pulse Resp SpO2  01/17/22 0508 111/63 98 F (36.7 C) -- 72 17 --  01/16/22 2022 (!) 101/53 98.4 F (36.9 C) Oral 80 16 --  01/16/22 1550 111/62 98.4 F (36.9 C) Oral 73 17 100 %     Physical Exam:  General: alert, cooperative, and appears stated age Mood/Affect: happy Lungs: clear to auscultation, no wheezes, rales or rhonchi, symmetric air entry.  Heart: normal rate, regular rhythm, normal S1, S2, no murmurs, rubs, clicks or gallops. Breast: breasts appear normal, no suspicious masses, no skin or nipple changes or axillary nodes. Abdomen:  + bowel sounds, soft, non-tender Incision: healing well, no significant drainage, no dehiscence, no significant erythema, Honeycomb dressing  Uterine Fundus: firm, involution -1 Lochia: appropriate Skin: Warm, Dry. DVT Evaluation: No evidence of DVT seen on physical exam. Negative Homan's sign. No cords or calf tenderness. No significant calf/ankle edema.   Labs: Recent Labs    01/15/22 1526 01/16/22 0459  HGB 9.8* 8.4*  HCT 30.1* 25.9*  WBC 15.8* 15.2*    CBG (last 3)  No results for input(s): "GLUCAP" in the last 72 hours.   I/O: I/O last 3 completed shifts: In: -  Out: 1300 [Urine:1300]   Assessment Postpartum Postoperative Day # 2  Cynthia Figueroa, [id number], [redacted]w[redacted]d, S/P pt was admitted on 10/4 with IVF pregnancy, was did with vanishing twin for RCS, h/o GAD, MDD no meds, vaginismus, was taken to the OR with Dr Alesia Richards on 10/5  under spinal epidural, PPH with EBL of 1200, TXA, aresta, and pit given, hgb drop of 10.7-9.8-8.4, asymptomatic and on PO Iron.  Pt stable. -1 Involution. Breast Feeding. Hemodynamically Stable.  Plan: Continue other mgmt as ordered ABLA: PO Iron VTE Prophylactics: SCD, ambulated as tolerates.  Pain control: Motrin/Tylenol/Narcotics PRN Education given regarding options for contraception, including barrier methods, injectable contraception, IUD placement, oral contraceptives.  Breastfeeding Discharge on POD #3  Dr. Alesia Richards to be updated on patient status  [place], FNP-C, PMHNP-BC  [place] [address] [place], [place] [address]  Cell: [phone]  Office Phone: [phone] Fax: [phone] 01/17/2022  10:45 AM

## 2022-01-16 NOTE — Social Work (Signed)
MOB was referred for history of depression/anxiety.  * Referral screened out by Clinical Social Worker because none of the following criteria appear to apply: ~ History of anxiety/depression during this pregnancy, or of post-partum depression following prior delivery. ~ Diagnosis of anxiety and/or depression within last 3 years OR * MOB's symptoms currently being treated with medication and/or therapy. Per chart review, MOB has an active prescription for Zoloft.   Please contact the Clinical Social Worker if needs arise, by [facility] request, or if MOB scores greater than 9/yes to question 10 on Edinburgh Postpartum Depression Screen.   Kathrin Greathouse, MSW, LCSW [facility] Worker  [phone] 01/16/2022  8:39 AM

## 2022-01-17 MED ORDER — SERTRALINE HCL 50 MG PO TABS
50.0000 mg | ORAL_TABLET | Freq: Every day | ORAL | Status: DC
  Administered 2022-01-17: 50 mg via ORAL
  Filled 2022-01-17: qty 1

## 2022-01-17 MED ORDER — PRENATAL MULTIVITAMIN CH
1.0000 | ORAL_TABLET | Freq: Every day | ORAL | Status: DC
  Administered 2022-01-17: 1 via ORAL
  Filled 2022-01-17: qty 1

## 2022-01-18 MED ORDER — POLYSACCHARIDE IRON COMPLEX 150 MG PO CAPS
150.0000 mg | ORAL_CAPSULE | Freq: Every day | ORAL | 0 refills | Status: DC
  Filled 2022-01-18: qty 42, 42d supply, fill #0

## 2022-01-18 MED ORDER — IBUPROFEN 600 MG PO TABS
600.0000 mg | ORAL_TABLET | Freq: Four times a day (QID) | ORAL | 0 refills | Status: DC
  Filled 2022-01-18: qty 30, 8d supply, fill #0

## 2022-01-18 MED ORDER — OXYCODONE HCL 5 MG PO TABS: 5.0000 mg | ORAL_TABLET | ORAL | 0 refills | Status: DC | PRN

## 2022-01-18 MED ORDER — SENNOSIDES-DOCUSATE SODIUM 8.6-50 MG PO TABS
2.0000 | ORAL_TABLET | Freq: Once | ORAL | Status: AC
  Administered 2022-01-18: 2 via ORAL
  Filled 2022-01-18: qty 2

## 2022-01-18 NOTE — Discharge Summary (Signed)
Postpartum Discharge Summary    Patient Name: Cynthia Figueroa DOB: [birth year] MRN: [id number]  Date of admission: 01/15/2022 Delivery date:01/15/2022  Delivering provider: Hoover Browns  Date of discharge: 01/18/2022  Admitting diagnosis: Status post repeat low transverse cesarean section [Z98.891] Intrauterine pregnancy: [redacted]w[redacted]d     Secondary diagnosis:  Principal Problem:   Status post repeat low transverse cesarean section Active Problems:  Pre-op and post-op anemia   Postpartum hemorrhage   Acute blood loss anemia   Normal postpartum course Additional problems: History or Vaginismus, anxiety and depression.    Discharge diagnosis: Term Pregnancy Delivered                                              Post partum procedures: None Augmentation: N/A Complications: None  Hospital course: Sceduled C/S   36 y.o. yo G2P2002 at [redacted]w[redacted]d was admitted to the hospital 01/15/2022 for scheduled cesarean section with the following indication:Elective Repeat.Delivery details are as follows:  Membrane Rupture Time/Date: 9:54 AM ,01/15/2022   Delivery Method:C-Section, Low Transverse  Details of operation can be found in separate operative note.  Patient had a postpartum course that was uncomplicated.  She is ambulating, tolerating a regular diet, passing flatus, and urinating well. Patient is discharged home in stable condition on  02/01/22        Newborn Data: Birth date:01/15/2022  Birth time:9:57 AM  Gender:Female  Living status:Living  Apgars:9 ,9  Weight:3.03 kg      Physical exam  Vitals:   01/16/22 2022 01/17/22 0508 01/17/22 2053 01/18/22 0531  BP: (!) 101/53 111/63 119/65 103/66  Pulse: 80 72 99 75  Resp: 16 17 18 18   Temp: 98.4 F (36.9 C) 98 F (36.7 C) 98.5 F (36.9 C) 98 F (36.7 C)  TempSrc: Oral  Oral Oral  SpO2:   100% 100%  Weight:      Height:       General: alert, cooperative, and no distress Lochia: appropriate Uterine Fundus: firm Incision: Dressing is clean,  dry, and intact DVT Evaluation: No evidence of DVT seen on physical exam. Calf/Ankle edema is present Labs: Lab Results  Component Value Date   WBC 15.2 (H) 01/16/2022   HGB 8.4 (L) 01/16/2022   HCT 25.9 (L) 01/16/2022   MCV 91.2 01/16/2022   PLT 253 01/16/2022      Latest Ref Rng & Units 01/13/2022    9:17 AM  CMP  Glucose 70 - 99 mg/dL 87   BUN 6 - 20 mg/dL 5   Creatinine 03/15/2022 - 0.99 mg/dL 8.33   Sodium 8.25 - 053 mmol/L 136   Potassium 3.5 - 5.1 mmol/L 3.8   Chloride 98 - 111 mmol/L 107   CO2 22 - 32 mmol/L 22   Calcium 8.9 - 10.3 mg/dL 8.4    Edinburgh Score:    01/16/2022    3:39 PM  Edinburgh Postnatal Depression Scale Screening Tool  I have been able to laugh and see the funny side of things. 0  I have looked forward with enjoyment to things. 0  I have blamed myself unnecessarily when things went wrong. 1  I have been anxious or worried for no good reason. 0  I have felt scared or panicky for no good reason. 0  Things have been getting on top of me. 1  I have been so unhappy that  I have had difficulty sleeping. 0  I have felt sad or miserable. 0  I have been so unhappy that I have been crying. 0  The thought of harming myself has occurred to me. 0  Edinburgh Postnatal Depression Scale Total 2     After visit meds:  Allergies as of 01/18/2022       Reactions   Lamisil [terbinafine] Hives        Medication List     STOP taking these medications    aspirin EC 81 MG tablet   ferrous sulfate 325 (65 FE) MG tablet   metoCLOPramide 10 MG tablet Commonly known as: Reglan       TAKE these medications    ibuprofen 600 MG tablet Commonly known as: ADVIL Take 1 tablet (600 mg total) by mouth every 6 (six) hours.   iron polysaccharides 150 MG capsule Commonly known as: NIFEREX Take 1 capsule (150 mg total) by mouth daily.   OVER THE COUNTER MEDICATION Take 10 mLs by mouth 2 (two) times daily. Floradix liquid iron   oxyCODONE 5 MG immediate  release tablet Commonly known as: Oxy IR/ROXICODONE Take 1-2 tablets (5-10 mg total) by mouth every 4 (four) hours as needed for moderate pain.   PRENATAL PO Take 1 tablet by mouth daily.   sertraline 50 MG tablet Commonly known as: ZOLOFT Take 1 tablet by mouth every day       Discharge home in stable condition Infant Feeding: Bottle Infant Disposition:home with mother Discharge instruction: per After Visit Summary and Postpartum booklet. Activity: Advance as tolerated. Pelvic rest for 6 weeks.  Diet: routine diet Future Appointments:No future appointments. Follow up Visit:  Follow-up Information     Ob/Gyn, [place]. Go on 01/23/2022.   Specialty: Obstetrics and Gynecology Why: Incision and mood checks. Contact information: [address] [place] [address] [phone]         Waymon Amato, MD. Schedule an appointment as soon as possible for a visit in 6 week(s).   Specialty: Obstetrics and Gynecology Why: Postpartum check. Contact information: [address] [place] [address] [phone]                 01/18/2022 Archie Endo, MD

## 2022-01-19 ENCOUNTER — Other Ambulatory Visit (HOSPITAL_COMMUNITY): Payer: Self-pay

## 2022-01-19 LAB — SURGICAL PATHOLOGY

## 2022-01-19 MED ORDER — OXYCODONE HCL 5 MG PO TABS
5.0000 mg | ORAL_TABLET | Freq: Four times a day (QID) | ORAL | 0 refills | Status: DC | PRN
  Filled 2022-01-19: qty 30, 8d supply, fill #0

## 2022-01-22 ENCOUNTER — Telehealth (HOSPITAL_COMMUNITY): Payer: Self-pay

## 2022-01-22 NOTE — Telephone Encounter (Signed)
Patient reports feeling good. "My incision is good. I have an incision check tomorrow.' Patient declines questions/concerns about her health and healing.  Patient reports that baby is doing good. Eating, peeing/pooping, and gaining weight well. Baby sleeps in a bassinet. RN reviewed ABC's of safe sleep with patient. Patient declines any questions or concerns about baby.  EPDS score is 5.  Sharyn Lull [facility]  01/22/22,1544

## 2022-01-23 DIAGNOSIS — F419 Anxiety disorder, unspecified: Secondary | ICD-10-CM | POA: Diagnosis not present

## 2022-01-23 DIAGNOSIS — Z4801 Encounter for change or removal of surgical wound dressing: Secondary | ICD-10-CM | POA: Diagnosis not present

## 2022-02-13 ENCOUNTER — Other Ambulatory Visit (HOSPITAL_COMMUNITY): Payer: Self-pay

## 2022-02-19 ENCOUNTER — Other Ambulatory Visit (HOSPITAL_COMMUNITY): Payer: Self-pay

## 2022-02-19 DIAGNOSIS — F321 Major depressive disorder, single episode, moderate: Secondary | ICD-10-CM | POA: Diagnosis not present

## 2022-02-19 DIAGNOSIS — N942 Vaginismus: Secondary | ICD-10-CM | POA: Diagnosis not present

## 2022-02-19 DIAGNOSIS — Z3040 Encounter for surveillance of contraceptives, unspecified: Secondary | ICD-10-CM | POA: Diagnosis not present

## 2022-02-19 MED ORDER — SERTRALINE HCL 50 MG PO TABS
50.0000 mg | ORAL_TABLET | Freq: Every day | ORAL | 3 refills | Status: DC
  Filled 2022-02-19: qty 30, 30d supply, fill #0
  Filled 2022-04-07: qty 30, 30d supply, fill #1
  Filled 2022-05-01: qty 30, 30d supply, fill #2
  Filled 2022-06-04: qty 30, 30d supply, fill #3

## 2022-02-19 MED ORDER — XULANE 150-35 MCG/24HR TD PTWK
1.0000 | MEDICATED_PATCH | TRANSDERMAL | 1 refills | Status: DC
  Filled 2022-02-19: qty 9, 84d supply, fill #0

## 2022-02-23 ENCOUNTER — Other Ambulatory Visit (HOSPITAL_COMMUNITY): Payer: Self-pay

## 2022-02-24 ENCOUNTER — Other Ambulatory Visit (HOSPITAL_COMMUNITY): Payer: Self-pay

## 2022-04-15 ENCOUNTER — Telehealth: Payer: Commercial Managed Care - PPO | Admitting: Family Medicine

## 2022-04-15 DIAGNOSIS — U071 COVID-19: Secondary | ICD-10-CM

## 2022-04-15 MED ORDER — PREDNISONE 20 MG PO TABS: 20.0000 mg | ORAL_TABLET | Freq: Two times a day (BID) | ORAL | 0 refills | Status: AC

## 2022-04-15 MED ORDER — BENZONATATE 200 MG PO CAPS: 200.0000 mg | ORAL_CAPSULE | Freq: Two times a day (BID) | ORAL | 0 refills | Status: DC | PRN

## 2022-04-15 NOTE — Progress Notes (Signed)
Virtual Visit Consent   Cynthia Figueroa, you are scheduled for a virtual visit with a [facility] provider today. Just as with appointments in the office, your consent must be obtained to participate. Your consent will be active for this visit and any virtual visit you may have with one of our providers in the next 365 days. If you have a MyChart account, a copy of this consent can be sent to you electronically.  As this is a virtual visit, video technology does not allow for your provider to perform a traditional examination. This may limit your provider's ability to fully assess your condition. If your provider identifies any concerns that need to be evaluated in person or the need to arrange testing (such as labs, EKG, etc.), we will make arrangements to do so. Although advances in technology are sophisticated, we cannot ensure that it will always work on either your end or our end. If the connection with a video visit is poor, the visit may have to be switched to a telephone visit. With either a video or telephone visit, we are not always able to ensure that we have a secure connection.  By engaging in this virtual visit, you consent to the provision of healthcare and authorize for your insurance to be billed (if applicable) for the services provided during this visit. Depending on your insurance coverage, you may receive a charge related to this service.  I need to obtain your verbal consent now. Are you willing to proceed with your visit today? Cynthia Figueroa has provided verbal consent on 04/15/2022 for a virtual visit (video or telephone). Dellia Nims, FNP  Date: 04/15/2022 8:27 PM  Virtual Visit via Video Note   I, Dellia Nims, connected with  Cynthia Figueroa  ([id number], Feb 23, 1986) on 04/15/22 at  8:15 PM EST by a video-enabled telemedicine application and verified that I am speaking with the correct person using two identifiers.  Location: Patient: Virtual Visit Location Patient: Home Provider:  Virtual Visit Location Provider: Home Office   I discussed the limitations of evaluation and management by telemedicine and the availability of in person appointments. The patient expressed understanding and agreed to proceed.    History of Present Illness: Cynthia Figueroa is a 37 y.o. who identifies as a female who was assigned female at birth, and is being seen today for covid positive test at home with sx starting last week. Her 34 month old has covid since last week. .She tested for covid last week and it was neg. Today she was feeling worse and retook a test today and it was positive. No fever. She does have cough, wheezing, no body aches or chills. She is not breast feeding. She returned to work today from maternity leave and was feeling worse tonight so she retested positive.   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   PCOS (polycystic ovarian syndrome) 03/08/2013    Allergies:  Allergies  Allergen Reactions   Lamisil [Terbinafine] Hives   Medications:  Current  Outpatient Medications:    benzonatate (TESSALON) 200 MG capsule, Take 1 capsule (200 mg total) by mouth 2 (two) times daily as needed for cough., Disp: 20 capsule, Rfl: 0   ibuprofen (ADVIL) 600 MG tablet, Take 1 tablet (600 mg total) by mouth every 6 (six) hours., Disp: 30 tablet, Rfl: 0   iron polysaccharides (NIFEREX) 150 MG capsule, Take 1 capsule (150 mg total) by mouth daily., Disp: 42  capsule, Rfl: 0   norelgestromin-ethinyl estradiol (XULANE) 150-35 MCG/24HR transdermal patch, Place 1 patch onto the dry skin once a week for 3 weeks, then off for 1 week, Disp: 9 patch, Rfl: 1   OVER THE COUNTER MEDICATION, Take 10 mLs by mouth 2 (two) times daily. Floradix liquid iron, Disp: , Rfl:    oxyCODONE (OXY IR/ROXICODONE) 5 MG immediate release tablet, Take 1-2 tablets (5-10 mg total) by mouth every 4 (four) hours as needed for moderate pain., Disp: 30 tablet, Rfl: 0   oxyCODONE (OXY IR/ROXICODONE) 5 MG immediate release tablet, Take 1 tablet (5 mg total) by mouth 4 (four) times daily as needed., Disp: 30 tablet, Rfl: 0   predniSONE (DELTASONE) 20 MG tablet, Take 1 tablet (20 mg total) by mouth 2 (two) times daily with a meal for 5 days., Disp: 10 tablet, Rfl: 0   Prenatal Vit-Fe Fumarate-FA (PRENATAL PO), Take 1 tablet by mouth daily., Disp: , Rfl:    sertraline (ZOLOFT) 50 MG tablet, Take 1 tablet (50 mg total) by mouth daily., Disp: 30 tablet, Rfl: 3  Observations/Objective: Patient is well-developed, well-nourished in no acute distress.  Resting comfortably  at home.  Head is normocephalic, atraumatic.  No labored breathing.  Speech is clear and coherent with logical content.  Patient is alert and oriented at baseline.    Assessment and Plan: 1. COVID  Increase fluids, humidifier at night, tylenol or ibuprofen as directed, urgent care if sx worsen,   Follow Up Instructions: I discussed the assessment and treatment plan with the patient. The patient was provided an opportunity to ask questions and all were answered. The patient agreed with the plan and demonstrated an understanding of the instructions.  A copy of instructions were sent to the patient via MyChart unless otherwise noted below.     The patient was advised to call back or seek an in-person evaluation if the symptoms worsen or if the condition fails to improve as anticipated.  Time:  I spent 10 minutes with  the patient via telehealth technology discussing the above problems/concerns.    Dellia Nims, FNP

## 2022-04-15 NOTE — Patient Instructions (Signed)
Person Under Monitoring Name: Cynthia Figueroa  Location: [address] [place] [address]   CORONAVIRUS DISEASE 2019 (COVID-19) Guidance for Persons Under Investigation You are being tested for the virus that causes coronavirus disease 2019 (COVID-19). Public health actions are necessary to ensure protection of your health and the health of others, and to prevent further spread of infection. COVID-19 is caused by a virus that can cause symptoms, such as fever, cough, and shortness of breath. The primary transmission from person to person is by coughing or sneezing. On May 12, 2018, the World Health Organization announced a [employer] Emergency of International Concern and on May 13, 2018 the U.S. Department of Health and Human Services declared a public health emergency. If the virus that causesCOVID-19 spreads in the community, it could have severe public health consequences.  As a person under investigation for COVID-19, the [employer] of Health and [employer], Division of [employer] advises you to adhere to the following guidance until your test results are reported to you. If your test result is positive, you will receive additional information from your provider and your local health department at that time.  Remain at home until you are cleared by your health provider or public health authorities.  Keep a log of visitors to your home using the form provided. Any visitors to your home must be aware of your isolation status. If you plan to move to a new address or leave the county, notify the local health department in your county. Call a doctor or seek care if you have an urgent medical need. Before seeking medical care, call ahead and get instructions from the provider before arriving at the medical office, clinic or hospital. Notify them that you are being tested for the virus that causes COVID-19 so arrangements can be made, as necessary, to  prevent transmission to others in the healthcare setting. Next, notify the local health department in your county. If a medical emergency arises and you need to call 911, inform the first responders that you are being tested for the virus that causes COVID-19. Next, notify the local health department in your county. Adhere to all guidance set forth by the [facility] Division of [employer] for [facility] of patients that is based on guidance from the Center for Disease Control and Prevention with suspected or confirmed COVID-19. It is provided with this guidance for Persons Under Investigation.  Your health and the health of our community are our top priorities. Public Health officials remain available to provide assistance and counseling to you about COVID-19 and compliance with this guidance.  Provider: ____________________________________________________________ Date: ______/_____/_________  By signing below, you acknowledge that you have read and agree to comply with this Guidance for Persons Under Investigation. ______________________________________________________________ Date: ______/_____/_________  WHO DO I CALL? You can find a list of local health departments here: [email or web address] Health Department: ____________________________________________________________________ Contact Name: ________________________________________________________________________ Telephone: ___________________________________________________________________________  [facility], Division of Public Health, Communicable Disease Branch COVID-19 Guidance for Persons Under Investigation March 7, 2020COVID-19 COVID-19, or coronavirus disease 2019, is an infection that is caused by a new (novel) coronavirus called SARS-CoV-2. COVID-19 can cause many symptoms. In some people, the virus may not cause any symptoms. In others, it may cause mild or severe  symptoms. Some people with severe infection develop severe disease. What are the causes? This illness is caused by a virus. The virus may be in the air as tiny specks of fluid (aerosols)  or droplets, or it may be on surfaces. You may catch the virus by: Breathing in droplets from an infected person. Droplets can be spread by a person breathing, speaking, singing, coughing, or sneezing. Touching something, like a table or a doorknob, that has virus on it (is contaminated) and then touching your mouth, nose, or eyes. What increases the risk? Risk for infection: You are more likely to get infected with the COVID-19 virus if: You are within 6 ft (1.8 m) of a person with COVID-19 for 15 minutes or longer. You are providing care for a person who is infected with COVID-19. You are in close personal contact with other people. Close personal contact includes hugging, kissing, or sharing eating or drinking utensils. Risk for serious illness caused by COVID-19: You are more likely to get seriously ill from the COVID-19 virus if: You have cancer. You have a long-term (chronic) disease, such as: Chronic lung disease. This includes pulmonary embolism, chronic obstructive pulmonary disease, and cystic fibrosis. Long-term disease that lowers your body's ability to fight infection (immunocompromise). Serious cardiac conditions, such as heart failure, coronary artery disease, or cardiomyopathy. Diabetes. Chronic kidney disease. Liver diseases. These include cirrhosis, nonalcoholic fatty liver disease, alcoholic liver disease, or autoimmune hepatitis. You have obesity. You are pregnant or were recently pregnant. You have sickle cell disease. What are the signs or symptoms? Symptoms of this condition can range from mild to severe. Symptoms may appear any time from 2 to 14 days after being exposed to the virus. They include: Fever or chills. Shortness of breath or trouble breathing. Feeling tired or very  tired. Headaches, body aches, or muscle aches. Runny or stuffy nose, sneezing, coughing, or sore throat. New loss of taste or smell. This is rare. Some people may also have stomach problems, such as nausea, vomiting, or diarrhea. Other people may not have any symptoms of COVID-19. How is this diagnosed? This condition may be diagnosed by testing samples to check for the COVID-19 virus. The most common tests are the PCR test and the antigen test. Tests may be done in the lab or at home. They include: Using a swab to take a sample of fluid from the back of your nose and throat (nasopharyngeal fluid), from your nose, or from your throat. Testing a sample of saliva from your mouth. Testing a sample of coughed-up mucus from your lungs (sputum). How is this treated? Treatment for COVID-19 infection depends on the severity of the condition. Mild symptoms can be managed at home with rest, fluids, and over-the-counter medicines. Serious symptoms may be treated in a hospital intensive care unit (ICU). Treatment in the ICU may include: Supplemental oxygen. Extra oxygen is given through a tube in the nose, a face mask, or a hood. Medicines. These may include: Antivirals, such as monoclonal antibodies. These help your body fight off certain viruses that can cause disease. Anti-inflammatories, such as corticosteroids. These reduce inflammation and suppress the immune system. Antithrombotics. These prevent or treat blood clots, if they develop. Convalescent plasma. This helps boost your immune system, if you have an underlying immunosuppressive condition or are getting immunosuppressive treatments. Prone positioning. This means you will lie on your stomach. This helps oxygen to get into your lungs. Infection control measures. If you are at risk for more serious illness caused by COVID-19, your health care provider may prescribe two long-acting monoclonal antibodies, given together every 6 months. How is  this prevented? To protect yourself: Use preventive medicine (pre-exposure prophylaxis). You may get  pre-exposure prophylaxis if you have moderate or severe immunocompromise. Get vaccinated. Anyone 46 months old or older who meets guidelines can get a COVID-19 vaccine or vaccine series. This includes people who are pregnant or making breast milk (lactating). Get an added dose of COVID-19 vaccine after your first vaccine or vaccine series if you have moderate to severe immunocompromise. This applies if you have had a solid organ transplant or have been diagnosed with an immunocompromising condition. You should get the added dose 4 weeks after you got the first COVID-19 vaccine or vaccine series. If you get an mRNA vaccine, you will need a 3-dose primary series. If you get the J&J/Janssen vaccine, you will need a 2-dose primary series, with the second dose being an mRNA vaccine. Talk to your health care provider about getting experimental monoclonal antibodies. This treatment is approved under emergency use authorization to prevent severe illness before or after being exposed to the COVID-19 virus. You may be given monoclonal antibodies if: You have moderate or severe immunocompromise. This includes treatments that lower your immune response. People with immunocompromise may not develop protection against COVID-19 when they are vaccinated. You cannot be vaccinated. You may not get a vaccine if you have a severe allergic reaction to the vaccine or its components. You are not fully vaccinated. You are in a facility where COVID-19 is present and: Are in close contact with a person who is infected with the COVID-19 virus. Are at high risk of being exposed to the COVID-19 virus. You are at risk of illness from new variants of the COVID-19 virus. To protect others: If you have symptoms of COVID-19, take steps to prevent the virus from spreading to others. Stay home. Leave your house only to get medical  care. Do not use public transit, if possible. Do not travel while you are sick. Wash your hands often with soap and water for at least 20 seconds. If soap and water are not available, use alcohol-based hand sanitizer. Make sure that all people in your household wash their hands well and often. Cough or sneeze into a tissue or your sleeve or elbow. Do not cough or sneeze into your hand or into the air. Where to find more information Centers for Disease Control and Prevention: [employer] World Health Organization: https://www.castaneda.info/ Get help right away if: You have trouble breathing. You have pain or pressure in your chest. You are confused. You have bluish lips and fingernails. You have trouble waking from sleep. You have symptoms that get worse. These symptoms may be an emergency. Get help right away. Call 911. Do not wait to see if the symptoms will go away. Do not drive yourself to the hospital. Summary COVID-19 is an infection that is caused by a new coronavirus. Sometimes, there are no symptoms. Other times, symptoms range from mild to severe. Some people with a severe COVID-19 infection develop severe disease. The virus that causes COVID-19 can spread from person to person through droplets or aerosols from breathing, speaking, singing, coughing, or sneezing. Mild symptoms of COVID-19 can be managed at home with rest, fluids, and over-the-counter medicines. This information is not intended to replace advice given to you by your health care provider. Make sure you discuss any questions you have with your health care provider. Document Revised: 03/18/2021 Document Reviewed: 03/20/2021 Elsevier Patient Education  [place].

## 2022-04-20 ENCOUNTER — Telehealth: Payer: Self-pay | Admitting: Pharmacist

## 2022-04-20 DIAGNOSIS — E282 Polycystic ovarian syndrome: Secondary | ICD-10-CM

## 2022-04-20 NOTE — Telephone Encounter (Addendum)
Last weight on file is from October when pt delivered her baby. Will need updated weight before submitting prior auth for [facility]. Messaged pt, will await response.

## 2022-04-20 NOTE — Telephone Encounter (Signed)
-----   Message from Freada Bergeron, MD sent at 04/20/2022 10:59 AM EST ----- Can we get her on GLP-1 for weight loss? She is hoping to try and shed some pounds. She is not breast feeding.  Thank you!  -Nira Conn

## 2022-04-21 ENCOUNTER — Ambulatory Visit: Payer: Commercial Managed Care - PPO | Attending: Cardiology

## 2022-04-21 DIAGNOSIS — E282 Polycystic ovarian syndrome: Secondary | ICD-10-CM

## 2022-04-21 NOTE — Telephone Encounter (Signed)
Baseline weight at PCP appt last week was 203 lbs, BMI 38.4, pt also prediabetic with A1c 6% in 01/2021. Will submit prior auth request for [facility], key [id number]

## 2022-04-22 ENCOUNTER — Other Ambulatory Visit (HOSPITAL_COMMUNITY): Payer: Self-pay

## 2022-04-22 LAB — HEMOGLOBIN A1C
Est. average glucose Bld gHb Est-mCnc: 120 mg/dL
Hgb A1c MFr Bld: 5.8 % — ABNORMAL HIGH (ref 4.8–5.6)

## 2022-04-22 MED ORDER — WEGOVY 0.25 MG/0.5ML ~~LOC~~ SOAJ
0.2500 mg | SUBCUTANEOUS | 0 refills | Status: DC
  Filled 2022-04-22: qty 2, 28d supply, fill #0

## 2022-04-22 MED ORDER — WEGOVY 0.25 MG/0.5ML ~~LOC~~ SOAJ
0.2500 mg | SUBCUTANEOUS | 0 refills | Status: DC
  Filled 2022-04-22: qty 2, fill #0

## 2022-04-22 NOTE — Telephone Encounter (Signed)
Wegovy PA approved through 11/11/22. Rx sent to pharmacy, pt made aware to let me know when pharmacy can get med in and will review injection technique, side effects, lifestyle adjustments etc.

## 2022-04-22 NOTE — Addendum Note (Signed)
Addended by: Loann Chahal E on: 04/22/2022 04:57 PM   Modules accepted: Orders

## 2022-04-23 ENCOUNTER — Other Ambulatory Visit (HOSPITAL_COMMUNITY): Payer: Self-pay

## 2022-04-28 ENCOUNTER — Encounter: Payer: Self-pay | Admitting: Nurse Practitioner

## 2022-04-29 NOTE — Telephone Encounter (Addendum)
Cynthia Figueroa still on national backorder and pharmacy doesn't know when they can get in stock.   Will submit prior authorization for Saxenda for pt to titrate up to maintenance dose faster, then ideally change to maintenance dose of Wegovy which hopefully pharmacy can get in stock easier.  Key: [id number]

## 2022-05-01 ENCOUNTER — Other Ambulatory Visit (HOSPITAL_COMMUNITY): Payer: Self-pay

## 2022-05-01 MED ORDER — SAXENDA 18 MG/3ML ~~LOC~~ SOPN
PEN_INJECTOR | SUBCUTANEOUS | 1 refills | Status: DC
  Filled 2022-05-01: qty 15, 30d supply, fill #0
  Filled 2022-05-01: qty 15, 28d supply, fill #0
  Filled 2022-06-04: qty 15, 30d supply, fill #1

## 2022-05-01 MED ORDER — PEN NEEDLES 32G X 4 MM MISC
0 refills | Status: DC
  Filled 2022-05-01: qty 30, 30d supply, fill #0
  Filled 2022-05-07: qty 100, 90d supply, fill #0

## 2022-05-01 NOTE — Telephone Encounter (Signed)
Saxenda PA approved through 08/20/22. Rx sent to pharmacy. Pt aware. Will titrate weekly to maintenance dose, then change to [facility] hoping that pharmacy can get higher doses in stock.

## 2022-05-01 NOTE — Addendum Note (Signed)
Addended by: Keelyn Fjelstad E on: 05/01/2022 08:13 AM   Modules accepted: Orders

## 2022-05-06 DIAGNOSIS — F329 Major depressive disorder, single episode, unspecified: Secondary | ICD-10-CM | POA: Diagnosis not present

## 2022-05-07 ENCOUNTER — Other Ambulatory Visit (HOSPITAL_COMMUNITY): Payer: Self-pay

## 2022-05-13 ENCOUNTER — Encounter: Payer: Self-pay | Admitting: Nurse Practitioner

## 2022-05-18 ENCOUNTER — Other Ambulatory Visit (HOSPITAL_COMMUNITY): Payer: Self-pay

## 2022-05-20 ENCOUNTER — Other Ambulatory Visit (HOSPITAL_COMMUNITY): Payer: Self-pay

## 2022-05-22 DIAGNOSIS — F320 Major depressive disorder, single episode, mild: Secondary | ICD-10-CM | POA: Diagnosis not present

## 2022-06-04 ENCOUNTER — Other Ambulatory Visit (HOSPITAL_COMMUNITY): Payer: Self-pay

## 2022-06-04 DIAGNOSIS — F320 Major depressive disorder, single episode, mild: Secondary | ICD-10-CM | POA: Diagnosis not present

## 2022-06-08 ENCOUNTER — Other Ambulatory Visit (HOSPITAL_COMMUNITY): Payer: Self-pay

## 2022-06-08 ENCOUNTER — Telehealth: Payer: Self-pay | Admitting: Pharmacist

## 2022-06-08 MED ORDER — WEGOVY 2.4 MG/0.75ML ~~LOC~~ SOAJ
2.4000 mg | SUBCUTANEOUS | 11 refills | Status: DC
  Filled 2022-06-08: qty 3, 28d supply, fill #0

## 2022-06-08 MED ORDER — WEGOVY 2.4 MG/0.75ML ~~LOC~~ SOAJ
2.4000 mg | SUBCUTANEOUS | 3 refills | Status: DC
  Filled 2022-06-08 – 2022-06-10 (×2): qty 9, 84d supply, fill #0
  Filled 2022-07-03: qty 3, 28d supply, fill #1
  Filled 2022-08-06: qty 9, 84d supply, fill #1

## 2022-06-08 MED ORDER — WEGOVY 1.7 MG/0.75ML ~~LOC~~ SOAJ
1.7000 mg | SUBCUTANEOUS | 0 refills | Status: DC
  Filled 2022-06-08: qty 3, 28d supply, fill #0

## 2022-06-08 NOTE — Addendum Note (Signed)
Addended by: Ezekiel Menzer E on: 06/08/2022 12:56 PM   Modules accepted: Orders

## 2022-06-08 NOTE — Telephone Encounter (Addendum)
Pt tolerating Saxenda well, on 2nd week of '3mg'$  daily maintenance dose. No side effects, has lost 10 lbs so far. Will change to [facility] - pt prefers 1 month of 1.'7mg'$  before increasing to maintenance dose of Wegovy 2.'4mg'$  weekly. Both rx sent to pharmacy. Counseled on different pen device. Advised pt to sign up for $0 copay card online.

## 2022-06-09 ENCOUNTER — Other Ambulatory Visit: Payer: Self-pay | Admitting: Pharmacist

## 2022-06-10 ENCOUNTER — Other Ambulatory Visit (HOSPITAL_COMMUNITY): Payer: Self-pay

## 2022-06-11 ENCOUNTER — Other Ambulatory Visit (HOSPITAL_COMMUNITY): Payer: Self-pay

## 2022-06-18 ENCOUNTER — Other Ambulatory Visit: Payer: Self-pay

## 2022-06-18 ENCOUNTER — Other Ambulatory Visit (HOSPITAL_COMMUNITY): Payer: Self-pay

## 2022-06-20 ENCOUNTER — Other Ambulatory Visit (HOSPITAL_COMMUNITY): Payer: Self-pay

## 2022-06-25 DIAGNOSIS — F32A Depression, unspecified: Secondary | ICD-10-CM | POA: Diagnosis not present

## 2022-07-01 ENCOUNTER — Other Ambulatory Visit (HOSPITAL_COMMUNITY): Payer: Self-pay

## 2022-07-03 ENCOUNTER — Other Ambulatory Visit (HOSPITAL_COMMUNITY): Payer: Self-pay

## 2022-07-03 ENCOUNTER — Encounter: Payer: Self-pay | Admitting: Pharmacist

## 2022-07-03 ENCOUNTER — Other Ambulatory Visit: Payer: Self-pay

## 2022-07-13 ENCOUNTER — Other Ambulatory Visit (HOSPITAL_COMMUNITY): Payer: Self-pay

## 2022-07-13 ENCOUNTER — Other Ambulatory Visit: Payer: Self-pay | Admitting: Cardiology

## 2022-07-13 MED ORDER — SERTRALINE HCL 50 MG PO TABS
50.0000 mg | ORAL_TABLET | Freq: Every day | ORAL | 3 refills | Status: DC
  Filled 2022-07-13: qty 90, 90d supply, fill #0
  Filled 2022-10-06 – 2022-10-12 (×2): qty 90, 90d supply, fill #1
  Filled 2023-01-12: qty 90, 90d supply, fill #2
  Filled 2023-05-01 – 2023-05-06 (×2): qty 90, 90d supply, fill #3

## 2022-07-15 ENCOUNTER — Other Ambulatory Visit (HOSPITAL_COMMUNITY): Payer: Self-pay

## 2022-07-15 MED ORDER — SERTRALINE HCL 50 MG PO TABS
50.0000 mg | ORAL_TABLET | Freq: Every day | ORAL | 4 refills | Status: DC
  Filled 2022-07-15: qty 90, 90d supply, fill #0

## 2022-08-06 ENCOUNTER — Other Ambulatory Visit: Payer: Self-pay

## 2022-08-08 ENCOUNTER — Other Ambulatory Visit (HOSPITAL_COMMUNITY): Payer: Self-pay

## 2022-08-10 ENCOUNTER — Telehealth: Payer: Self-pay | Admitting: Pharmacist

## 2022-08-10 ENCOUNTER — Ambulatory Visit: Payer: Commercial Managed Care - PPO | Attending: Cardiovascular Disease

## 2022-08-10 DIAGNOSIS — Z3200 Encounter for pregnancy test, result unknown: Secondary | ICD-10-CM | POA: Diagnosis not present

## 2022-08-10 LAB — HCG, SERUM, QUALITATIVE: hCG,Beta Subunit,Qual,Serum: POSITIVE m[IU]/mL — AB (ref <6)

## 2022-08-10 NOTE — Addendum Note (Signed)
Addended by: Deacon Gadbois E on: 08/10/2022 12:10 PM   Modules accepted: Orders

## 2022-08-10 NOTE — Telephone Encounter (Signed)
Pt with positive at home pregnancy test, concerned since she is on Wegovy which is contraindicated in pregnancy, was due for a dose yesterday. Currently has a 19 month old, this would be first positive pregnancy test without IVF. Has a call in to her OB office but is still waiting to hear back. hCG lab ordered today, stat.

## 2022-08-10 NOTE — Addendum Note (Signed)
Addended by: Minela Bridgewater E on: 08/10/2022 03:02 PM   Modules accepted: Orders

## 2022-08-10 NOTE — Telephone Encounter (Signed)
Pt aware pregnancy test is positive, to follow up with her OBGYN, and to stop her Wegovy.

## 2022-08-17 DIAGNOSIS — Z8742 Personal history of other diseases of the female genital tract: Secondary | ICD-10-CM | POA: Diagnosis not present

## 2022-08-31 ENCOUNTER — Other Ambulatory Visit (HOSPITAL_COMMUNITY): Payer: Self-pay

## 2022-09-02 ENCOUNTER — Other Ambulatory Visit (HOSPITAL_COMMUNITY): Payer: Self-pay

## 2022-09-02 MED ORDER — METOCLOPRAMIDE HCL 10 MG PO TABS
10.0000 mg | ORAL_TABLET | Freq: Four times a day (QID) | ORAL | 3 refills | Status: DC | PRN
  Filled 2022-09-02: qty 36, 9d supply, fill #0

## 2022-09-04 ENCOUNTER — Inpatient Hospital Stay (HOSPITAL_COMMUNITY)
Admission: AD | Admit: 2022-09-04 | Discharge: 2022-09-04 | Disposition: A | Discharge status: Home / Self Care | Payer: Commercial Managed Care - PPO | Attending: Obstetrics & Gynecology | Admitting: Obstetrics & Gynecology

## 2022-09-04 ENCOUNTER — Encounter (HOSPITAL_COMMUNITY): Payer: Self-pay | Admitting: *Deleted

## 2022-09-04 ENCOUNTER — Inpatient Hospital Stay (HOSPITAL_COMMUNITY): Payer: Commercial Managed Care - PPO

## 2022-09-04 DIAGNOSIS — O26899 Other specified pregnancy related conditions, unspecified trimester: Secondary | ICD-10-CM

## 2022-09-04 DIAGNOSIS — R519 Headache, unspecified: Secondary | ICD-10-CM | POA: Insufficient documentation

## 2022-09-04 DIAGNOSIS — Z3A01 Less than 8 weeks gestation of pregnancy: Secondary | ICD-10-CM | POA: Diagnosis not present

## 2022-09-04 DIAGNOSIS — O99011 Anemia complicating pregnancy, first trimester: Secondary | ICD-10-CM | POA: Diagnosis not present

## 2022-09-04 DIAGNOSIS — O99891 Other specified diseases and conditions complicating pregnancy: Secondary | ICD-10-CM | POA: Diagnosis not present

## 2022-09-04 DIAGNOSIS — O208 Other hemorrhage in early pregnancy: Secondary | ICD-10-CM | POA: Diagnosis not present

## 2022-09-04 DIAGNOSIS — O26891 Other specified pregnancy related conditions, first trimester: Secondary | ICD-10-CM | POA: Diagnosis not present

## 2022-09-04 DIAGNOSIS — R197 Diarrhea, unspecified: Secondary | ICD-10-CM | POA: Diagnosis not present

## 2022-09-04 DIAGNOSIS — O219 Vomiting of pregnancy, unspecified: Secondary | ICD-10-CM | POA: Insufficient documentation

## 2022-09-04 DIAGNOSIS — R102 Pelvic and perineal pain: Secondary | ICD-10-CM | POA: Insufficient documentation

## 2022-09-04 DIAGNOSIS — Z3A08 8 weeks gestation of pregnancy: Secondary | ICD-10-CM | POA: Diagnosis not present

## 2022-09-04 LAB — CBC
HCT: 33.7 % — ABNORMAL LOW (ref 36.0–46.0)
Hemoglobin: 10.8 g/dL — ABNORMAL LOW (ref 12.0–15.0)
MCH: 26.8 pg (ref 26.0–34.0)
MCHC: 32 g/dL (ref 30.0–36.0)
MCV: 83.6 fL (ref 80.0–100.0)
Platelets: 377 10*3/uL (ref 150–400)
RBC: 4.03 MIL/uL (ref 3.87–5.11)
RDW: 14.6 % (ref 11.5–15.5)
WBC: 10.1 10*3/uL (ref 4.0–10.5)
nRBC: 0 % (ref 0.0–0.2)

## 2022-09-04 LAB — URINALYSIS, ROUTINE W REFLEX MICROSCOPIC
Bilirubin Urine: NEGATIVE
Glucose, UA: NEGATIVE mg/dL
Hgb urine dipstick: NEGATIVE
Ketones, ur: NEGATIVE mg/dL
Leukocytes,Ua: NEGATIVE
Nitrite: NEGATIVE
Protein, ur: NEGATIVE mg/dL
Specific Gravity, Urine: 1.019 (ref 1.005–1.030)
pH: 6 (ref 5.0–8.0)

## 2022-09-04 LAB — WET PREP, GENITAL
Clue Cells Wet Prep HPF POC: NONE SEEN
Sperm: NONE SEEN
Trich, Wet Prep: NONE SEEN
WBC, Wet Prep HPF POC: <10 (ref <10)
Yeast Wet Prep HPF POC: NONE SEEN

## 2022-09-04 LAB — POCT PREGNANCY, URINE: Preg Test, Ur: POSITIVE — AB

## 2022-09-04 LAB — HCG, QUANTITATIVE, PREGNANCY: hCG, Beta Chain, Quant, S: 155831 m[IU]/mL — ABNORMAL HIGH (ref <5)

## 2022-09-04 LAB — ABO/RH: ABO/RH(D): O POS

## 2022-09-04 NOTE — MAU Note (Signed)
.  Cynthia Figueroa is a 38 y.o. at Unknown here in MAU reporting: found out she was pregnant a few weeks ago. Has had diarrhea once a day everyday for a few weeks. On Wednesday  and yesterday she ha d it several times and her OB told her to get checked out if it was more tha once a day. No diarrhea today. C/o headache(not today), nausea and cramping.  LMP: 07/09/22 Onset of complaint: 2 weeks Pain score: 4 Vitals:   09/04/22 0830  BP: (!) 141/78  Pulse: 67  Resp: 18  Temp: 98.3 F (36.8 C)     FHT:n/a Lab orders placed from triage:  UPT, U/A

## 2022-09-04 NOTE — MAU Provider Note (Signed)
History     CSN: [id number]  Arrival date and time: 09/04/22 7829   Event Date/Time   First Provider Initiated Contact with Patient 09/04/22 [phone]      Chief Complaint  Patient presents with   Diarrhea   Cynthia Figueroa , a  37 y.o. [phone] at [redacted]w[redacted]d presents to MAU with on-going complaints of diarrhea, nausea vomiting and cramping. Patient states that for the last few weeks she has been experiencing 1 episode of diarrhea every day for the last few weeks. She states that yesterday she had it 3 times and was told to report to MAU for eval. She states that it is very watery.  She also states that she has been having intermittent abdominal cramping and nausea. She states that her pain is anywhere from a 3 up to a 7 /10. She states that she is not currently having nausea. She denies attempting to relieve symptoms.She denies vaginal bleeding, but notes that she has been having some white discharge. She denies urinary symptoms. She also reports frequent headaches that are relieved with rest and hydration for the last week.  Last intercourse was 1 week ago and noted no pain.           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 Su Hilt (GYN)   Vaginospasm    Vanishing twin syndrome    Vitamin D deficiency     Past Surgical History:  Procedure Laterality Date   CESAREAN SECTION N/A 12/04/2019   Procedure: CESAREAN SECTION;  Surgeon: Hoover Browns, MD;  Location: [facility]  LD ORS;  Service: Obstetrics;  Laterality: N/A;   CESAREAN SECTION N/A 01/15/2022   Procedure: CESAREAN SECTION;  Surgeon: Hoover Browns, MD;  Location: [facility] LD ORS;  Service: Obstetrics;  Laterality: N/A;    Family History  Problem Relation Age of Onset   Anemia Mother    High blood pressure Mother    Thyroid disease Mother    Obesity Mother    Healthy Father    Anemia Sister    Diabetes Maternal Grandmother    Alzheimer's disease Maternal Grandmother    Heart failure Maternal Grandmother    Thyroid disease Maternal Grandmother    Cancer Maternal Grandfather    Cancer Paternal Grandmother    Cancer Paternal Grandfather     Social History   Tobacco Use   Smoking status: Never   Smokeless tobacco: Never  Vaping Use   Vaping Use: Never used  Substance Use Topics   Alcohol use: No   Drug use: No    Allergies:  Allergies  Allergen Reactions   Lamisil [Terbinafine] Hives    Medications Prior to Admission  Medication Sig Dispense  Refill Last Dose   metoCLOPramide (REGLAN) 10 MG tablet Take 1 tablet (10 mg total) by mouth every 6 (six) hours as needed. 36 tablet 3 09/03/2022   Prenatal Vit-Fe Fumarate-FA (PRENATAL PO) Take 1 tablet by mouth daily.   Past Week   sertraline (ZOLOFT) 50 MG tablet Take 1 tablet (50 mg total) by mouth daily. 90 tablet 3 09/03/2022   ibuprofen (ADVIL) 600 MG tablet Take 1 tablet (600 mg total) by mouth every 6 (six) hours. 30 tablet 0    iron polysaccharides (NIFEREX) 150 MG capsule Take 1 capsule (150 mg total) by mouth daily. 42 capsule 0    norelgestromin-ethinyl estradiol (XULANE) 150-35 MCG/24HR transdermal patch Place 1 patch onto the dry skin once a week for 3 weeks, then off for 1 week 9 patch 1    OVER THE COUNTER MEDICATION Take 10 mLs by mouth 2 (two) times daily. Floradix liquid iron      sertraline (ZOLOFT) 50 MG tablet Take 1 tablet (50 mg total) by mouth daily. 90 tablet 4     Review of Systems  Constitutional:  Negative for chills,  fatigue and fever.  Eyes:  Negative for pain and visual disturbance.  Respiratory:  Negative for apnea, shortness of breath and wheezing.   Cardiovascular:  Negative for chest pain and palpitations.  Gastrointestinal:  Positive for abdominal pain, diarrhea and nausea. Negative for constipation and vomiting.  Genitourinary:  Positive for pelvic pain and vaginal pain. Negative for difficulty urinating, dysuria, vaginal bleeding and vaginal discharge.  Musculoskeletal:  Negative for back pain.  Neurological:  Negative for seizures, weakness and headaches.  Psychiatric/Behavioral:  Negative for suicidal ideas.    Physical Exam   Blood pressure 123/77, pulse 68, temperature 98.3 F (36.8 C), resp. rate 18, height 5\' 1"  (1.549 m), weight 85.3 kg, last menstrual period 07/09/2022, SpO2 100 %, currently breastfeeding.  Physical Exam Vitals and nursing note reviewed.  Constitutional:      General: She is not in acute distress.    Appearance: Normal appearance.  HENT:     Head: Normocephalic.  Cardiovascular:     Rate and Rhythm: Normal rate.  Pulmonary:     Effort: Pulmonary effort is normal.  Abdominal:     General: There is no distension.     Palpations: Abdomen is soft.     Tenderness: There is no abdominal tenderness.  Genitourinary:    Comments: Patient has overt vaginismus. Of note clumpy white vaginal discharge within the folds of the labia  Musculoskeletal:     Cervical back: Normal range of motion.  Skin:    General: Skin is warm and dry.  Neurological:     Mental Status: She is alert and oriented to person, place, and time.  Psychiatric:        Mood and Affect: Mood normal.     MAU Course  Procedures Orders Placed This Encounter  Procedures   Wet prep, genital   US OB LESS THAN 14 WEEKS WITH OB TRANSVAGINAL   Urinalysis, Routine w reflex microscopic -Urine, Clean Catch   CBC   hCG, quantitative, pregnancy   Diet NPO time specified   Pregnancy, urine POC   ABO/Rh    Results for orders placed or performed during the hospital encounter of 09/04/22 (from the past 24 hour(s))  Pregnancy, urine POC     Status: Abnormal   Collection Time: 09/04/22  8:34 AM  Result Value Ref Range   Preg Test, Ur POSITIVE (A) NEGATIVE  Urinalysis, Routine w reflex microscopic -Urine, Clean Catch     Status: Abnormal   Collection Time: 09/04/22  8:35 AM  Result Value Ref Range   Color, Urine YELLOW YELLOW   APPearance HAZY (A) CLEAR   Specific Gravity, Urine 1.019 1.005 - 1.030   pH 6.0 5.0 - 8.0   Glucose, UA NEGATIVE NEGATIVE mg/dL   Hgb urine dipstick NEGATIVE NEGATIVE   Bilirubin Urine NEGATIVE NEGATIVE   Ketones, ur NEGATIVE NEGATIVE mg/dL   Protein, ur NEGATIVE NEGATIVE mg/dL   Nitrite NEGATIVE NEGATIVE   Leukocytes,Ua NEGATIVE NEGATIVE  ABO/Rh     Status: None   Collection Time: 09/04/22  9:07 AM  Result Value Ref Range   ABO/RH(D)      O POS Performed at [facility], [address] [place], [place] [address]   CBC     Status: Abnormal   Collection Time: 09/04/22  9:10 AM  Result Value Ref Range   WBC 10.1 4.0 - 10.5 K/uL   RBC 4.03 3.87 - 5.11 MIL/uL   Hemoglobin 10.8 (L) 12.0 - 15.0 g/dL   HCT 60.4 (L) 54.0 - 98.1 %   MCV 83.6 80.0 - 100.0 fL   MCH 26.8 26.0 - 34.0 pg   MCHC 32.0 30.0 - 36.0 g/dL   RDW 19.1 47.8 - 29.5 %   Platelets 377 150 - 400 K/uL   nRBC 0.0 0.0 - 0.2 %  hCG, quantitative, pregnancy     Status: Abnormal   Collection Time: 09/04/22  9:10 AM  Result Value Ref Range   hCG, Beta Chain, Quant, S 155,831 (H) <5 mIU/mL  Wet prep, genital     Status: None   Collection Time: 09/04/22  9:20 AM   Specimen: PATH Cytology Cervicovaginal Ancillary Only  Result Value Ref Range   Yeast Wet Prep HPF POC NONE SEEN NONE SEEN   Trich, Wet Prep NONE SEEN NONE SEEN   Clue Cells Wet Prep HPF POC NONE SEEN NONE SEEN   WBC, Wet Prep HPF POC <10 <10   Sperm NONE SEEN    US OB LESS THAN 14 WEEKS WITH OB TRANSVAGINAL  Result Date:  09/04/2022 CLINICAL DATA:  Cramping. Estimated gestational age of [redacted] weeks, 1 day by LMP. EXAM: OBSTETRIC <14 WK [place] AND TRANSVAGINAL OB US TECHNIQUE: Both transabdominal and transvaginal ultrasound examinations were performed for complete evaluation of the gestation as well as the maternal uterus, adnexal regions, and pelvic cul-de-sac. Transvaginal technique was performed to assess early pregnancy. COMPARISON:  None Available. FINDINGS: Intrauterine gestational sac: Single. Yolk sac:  Visualized. Embryo:  Visualized. Cardiac Activity: Visualized. Heart Rate: 167 bpm CRL:  16.9 mm   8 w   1 d                  [place] EDC: 04/15/2023 Subchorionic hemorrhage: Small 2.9 x 0.8 x 0.6 cm subchorionic hemorrhage. Maternal uterus/adnexae: Unremarkable. IMPRESSION: 1. Single live intrauterine pregnancy with estimated gestational age of [redacted] weeks, 1 day. 2. Small subchorionic hemorrhage. Electronically Signed   By: Obie Dredge M.D.   On: 09/04/2022 11:58    MDM - No diarrhea at this time.  - Quant greater than 155 thousand, consistent with Gestational age.  - Hbg 10.8 mild anemia  - GC pending upon discharge  - Wet prep normal  - UA hazy otherwise normal . Low suspicion for UTI in pregnancy causing cramping   - [place] results revealed a single living IUP measuring about 8  weeks 1 day, consistent with LMP.  - Plan for discharge.   Assessment and Plan   1. Nausea and vomiting in pregnancy   2. Headache in pregnancy, antepartum, first trimester   3. Diarrhea, unspecified type   4. Pelvic cramping in antepartum period   5. Anemia during pregnancy in first trimester    - reviewed normal discomforts and expectation of first trimester pregnancy.  - Discussed pending GC results upon discharge - Worsening signs and return precautions reviewed,  - Recommended that patient seek care at [facility] office of her choice.  - Provider list and Safe medications in pregnancy List provided - Patient discharged home in stable  condition and may return to MAU as needed.   Claudette Head, MSN CNM  09/04/2022, 8:54 AM

## 2022-09-08 LAB — GC/CHLAMYDIA PROBE AMP (~~LOC~~) NOT AT ARMC
Chlamydia: NEGATIVE
Comment: NEGATIVE
Comment: NORMAL
Neisseria Gonorrhea: NEGATIVE

## 2022-09-09 ENCOUNTER — Other Ambulatory Visit (HOSPITAL_COMMUNITY): Payer: Self-pay

## 2022-09-09 DIAGNOSIS — Z3A09 9 weeks gestation of pregnancy: Secondary | ICD-10-CM | POA: Diagnosis not present

## 2022-09-09 DIAGNOSIS — N912 Amenorrhea, unspecified: Secondary | ICD-10-CM | POA: Diagnosis not present

## 2022-09-09 DIAGNOSIS — O219 Vomiting of pregnancy, unspecified: Secondary | ICD-10-CM | POA: Diagnosis not present

## 2022-09-09 MED ORDER — ONDANSETRON 4 MG PO TBDP
4.0000 mg | ORAL_TABLET | Freq: Four times a day (QID) | ORAL | 2 refills | Status: DC | PRN
  Filled 2022-09-09: qty 36, 9d supply, fill #0

## 2022-09-09 MED ORDER — ONDANSETRON HCL 4 MG PO TABS
4.0000 mg | ORAL_TABLET | Freq: Four times a day (QID) | ORAL | 2 refills | Status: AC | PRN
  Filled 2022-09-09 – 2023-07-12 (×3): qty 36, 9d supply, fill #0

## 2022-09-10 ENCOUNTER — Other Ambulatory Visit (HOSPITAL_COMMUNITY): Payer: Self-pay

## 2022-09-22 ENCOUNTER — Other Ambulatory Visit (HOSPITAL_COMMUNITY): Payer: Self-pay

## 2022-09-22 DIAGNOSIS — Z331 Pregnant state, incidental: Secondary | ICD-10-CM | POA: Diagnosis not present

## 2022-09-22 DIAGNOSIS — Z369 Encounter for antenatal screening, unspecified: Secondary | ICD-10-CM | POA: Diagnosis not present

## 2022-09-22 DIAGNOSIS — D649 Anemia, unspecified: Secondary | ICD-10-CM | POA: Diagnosis not present

## 2022-09-22 DIAGNOSIS — Z113 Encounter for screening for infections with a predominantly sexual mode of transmission: Secondary | ICD-10-CM | POA: Diagnosis not present

## 2022-09-22 DIAGNOSIS — Z3481 Encounter for supervision of other normal pregnancy, first trimester: Secondary | ICD-10-CM | POA: Diagnosis not present

## 2022-09-22 DIAGNOSIS — Z9889 Other specified postprocedural states: Secondary | ICD-10-CM | POA: Diagnosis not present

## 2022-09-22 DIAGNOSIS — N925 Other specified irregular menstruation: Secondary | ICD-10-CM | POA: Diagnosis not present

## 2022-09-22 LAB — OB RESULTS CONSOLE HEPATITIS B SURFACE ANTIGEN: Hepatitis B Surface Ag: NEGATIVE

## 2022-09-22 LAB — OB RESULTS CONSOLE HIV ANTIBODY (ROUTINE TESTING): HIV: NONREACTIVE

## 2022-09-22 LAB — OB RESULTS CONSOLE ANTIBODY SCREEN: Antibody Screen: NEGATIVE

## 2022-09-22 LAB — OB RESULTS CONSOLE RUBELLA ANTIBODY, IGM: Rubella: IMMUNE

## 2022-09-22 MED ORDER — FERROUS SULFATE ER 142 (45 FE) MG PO TBCR
1.0000 | EXTENDED_RELEASE_TABLET | Freq: Every day | ORAL | 8 refills | Status: AC
  Filled 2022-09-22: qty 30, 30d supply, fill #0
  Filled 2022-10-23 – 2023-01-12 (×2): qty 30, 30d supply, fill #1
  Filled 2023-07-11: qty 30, 30d supply, fill #2

## 2022-09-23 ENCOUNTER — Other Ambulatory Visit (HOSPITAL_COMMUNITY): Payer: Self-pay

## 2022-09-25 ENCOUNTER — Other Ambulatory Visit (HOSPITAL_COMMUNITY): Payer: Self-pay

## 2022-09-25 MED ORDER — VITAMIN D3 50 MCG (2000 UT) PO TABS
2.0000 | ORAL_TABLET | Freq: Every day | ORAL | 6 refills | Status: AC
  Filled 2022-10-06 – 2022-10-23 (×2): qty 100, 50d supply, fill #0
  Filled 2023-01-12: qty 60, 30d supply, fill #0
  Filled 2023-05-01 – 2023-05-06 (×2): qty 60, 30d supply, fill #1
  Filled 2023-07-11: qty 100, 50d supply, fill #2

## 2022-09-28 ENCOUNTER — Other Ambulatory Visit (HOSPITAL_COMMUNITY): Payer: Self-pay

## 2022-10-06 ENCOUNTER — Other Ambulatory Visit: Payer: Self-pay

## 2022-10-06 ENCOUNTER — Other Ambulatory Visit (HOSPITAL_COMMUNITY): Payer: Self-pay

## 2022-10-07 ENCOUNTER — Other Ambulatory Visit: Payer: Self-pay

## 2022-10-09 ENCOUNTER — Other Ambulatory Visit (HOSPITAL_COMMUNITY): Payer: Self-pay

## 2022-10-09 ENCOUNTER — Other Ambulatory Visit: Payer: Self-pay

## 2022-10-16 ENCOUNTER — Other Ambulatory Visit (HOSPITAL_COMMUNITY): Payer: Self-pay

## 2022-10-21 DIAGNOSIS — Z369 Encounter for antenatal screening, unspecified: Secondary | ICD-10-CM | POA: Diagnosis not present

## 2022-10-23 ENCOUNTER — Other Ambulatory Visit (HOSPITAL_COMMUNITY): Payer: Self-pay

## 2022-10-26 ENCOUNTER — Other Ambulatory Visit (HOSPITAL_COMMUNITY): Payer: Self-pay

## 2022-10-28 ENCOUNTER — Other Ambulatory Visit: Payer: Self-pay | Admitting: Obstetrics and Gynecology

## 2022-10-28 DIAGNOSIS — O99210 Obesity complicating pregnancy, unspecified trimester: Secondary | ICD-10-CM

## 2022-10-28 DIAGNOSIS — Z363 Encounter for antenatal screening for malformations: Secondary | ICD-10-CM

## 2022-10-28 DIAGNOSIS — O34219 Maternal care for unspecified type scar from previous cesarean delivery: Secondary | ICD-10-CM

## 2022-10-28 DIAGNOSIS — O09522 Supervision of elderly multigravida, second trimester: Secondary | ICD-10-CM

## 2022-11-04 ENCOUNTER — Other Ambulatory Visit (HOSPITAL_COMMUNITY): Payer: Self-pay

## 2022-11-23 DIAGNOSIS — O09529 Supervision of elderly multigravida, unspecified trimester: Secondary | ICD-10-CM | POA: Insufficient documentation

## 2022-11-23 DIAGNOSIS — Z148 Genetic carrier of other disease: Secondary | ICD-10-CM | POA: Diagnosis not present

## 2022-11-24 ENCOUNTER — Ambulatory Visit: Payer: Commercial Managed Care - PPO

## 2022-11-24 ENCOUNTER — Other Ambulatory Visit: Payer: Commercial Managed Care - PPO

## 2022-11-27 ENCOUNTER — Encounter: Payer: Self-pay | Admitting: *Deleted

## 2022-11-27 ENCOUNTER — Ambulatory Visit: Payer: Commercial Managed Care - PPO | Admitting: *Deleted

## 2022-11-27 ENCOUNTER — Other Ambulatory Visit: Payer: Self-pay | Admitting: *Deleted

## 2022-11-27 ENCOUNTER — Ambulatory Visit (HOSPITAL_BASED_OUTPATIENT_CLINIC_OR_DEPARTMENT_OTHER): Payer: Commercial Managed Care - PPO

## 2022-11-27 VITALS — BP 125/72 | HR 72

## 2022-11-27 DIAGNOSIS — E282 Polycystic ovarian syndrome: Secondary | ICD-10-CM

## 2022-11-27 DIAGNOSIS — Z362 Encounter for other antenatal screening follow-up: Secondary | ICD-10-CM

## 2022-11-27 DIAGNOSIS — Z363 Encounter for antenatal screening for malformations: Secondary | ICD-10-CM | POA: Diagnosis not present

## 2022-11-27 DIAGNOSIS — O365920 Maternal care for other known or suspected poor fetal growth, second trimester, not applicable or unspecified: Secondary | ICD-10-CM | POA: Insufficient documentation

## 2022-11-27 DIAGNOSIS — Z3A20 20 weeks gestation of pregnancy: Secondary | ICD-10-CM | POA: Diagnosis not present

## 2022-11-27 DIAGNOSIS — O99212 Obesity complicating pregnancy, second trimester: Secondary | ICD-10-CM | POA: Diagnosis not present

## 2022-11-27 DIAGNOSIS — E669 Obesity, unspecified: Secondary | ICD-10-CM | POA: Diagnosis not present

## 2022-11-27 DIAGNOSIS — O34219 Maternal care for unspecified type scar from previous cesarean delivery: Secondary | ICD-10-CM

## 2022-11-27 DIAGNOSIS — D563 Thalassemia minor: Secondary | ICD-10-CM | POA: Diagnosis not present

## 2022-11-27 DIAGNOSIS — O99282 Endocrine, nutritional and metabolic diseases complicating pregnancy, second trimester: Secondary | ICD-10-CM | POA: Diagnosis not present

## 2022-11-27 DIAGNOSIS — O99210 Obesity complicating pregnancy, unspecified trimester: Secondary | ICD-10-CM

## 2022-11-27 DIAGNOSIS — O09522 Supervision of elderly multigravida, second trimester: Secondary | ICD-10-CM | POA: Diagnosis not present

## 2022-11-27 DIAGNOSIS — O285 Abnormal chromosomal and genetic finding on antenatal screening of mother: Secondary | ICD-10-CM | POA: Diagnosis not present

## 2022-12-22 ENCOUNTER — Encounter: Payer: Self-pay | Admitting: *Deleted

## 2022-12-22 DIAGNOSIS — O34219 Maternal care for unspecified type scar from previous cesarean delivery: Secondary | ICD-10-CM | POA: Insufficient documentation

## 2022-12-25 ENCOUNTER — Other Ambulatory Visit: Payer: Self-pay | Admitting: Obstetrics and Gynecology

## 2022-12-25 ENCOUNTER — Ambulatory Visit: Payer: Commercial Managed Care - PPO | Attending: Maternal & Fetal Medicine

## 2022-12-25 DIAGNOSIS — O09522 Supervision of elderly multigravida, second trimester: Secondary | ICD-10-CM | POA: Insufficient documentation

## 2022-12-25 DIAGNOSIS — E669 Obesity, unspecified: Secondary | ICD-10-CM

## 2022-12-25 DIAGNOSIS — O365920 Maternal care for other known or suspected poor fetal growth, second trimester, not applicable or unspecified: Secondary | ICD-10-CM

## 2022-12-25 DIAGNOSIS — Z362 Encounter for other antenatal screening follow-up: Secondary | ICD-10-CM | POA: Diagnosis not present

## 2022-12-25 DIAGNOSIS — O99212 Obesity complicating pregnancy, second trimester: Secondary | ICD-10-CM

## 2022-12-25 DIAGNOSIS — O99012 Anemia complicating pregnancy, second trimester: Secondary | ICD-10-CM | POA: Diagnosis not present

## 2022-12-25 DIAGNOSIS — Z3A24 24 weeks gestation of pregnancy: Secondary | ICD-10-CM | POA: Diagnosis not present

## 2022-12-25 DIAGNOSIS — O99282 Endocrine, nutritional and metabolic diseases complicating pregnancy, second trimester: Secondary | ICD-10-CM

## 2022-12-25 DIAGNOSIS — E282 Polycystic ovarian syndrome: Secondary | ICD-10-CM

## 2022-12-25 DIAGNOSIS — O34219 Maternal care for unspecified type scar from previous cesarean delivery: Secondary | ICD-10-CM | POA: Diagnosis not present

## 2022-12-25 DIAGNOSIS — D563 Thalassemia minor: Secondary | ICD-10-CM

## 2022-12-25 DIAGNOSIS — O99213 Obesity complicating pregnancy, third trimester: Secondary | ICD-10-CM

## 2023-01-12 ENCOUNTER — Other Ambulatory Visit (HOSPITAL_COMMUNITY): Payer: Self-pay

## 2023-01-12 ENCOUNTER — Other Ambulatory Visit: Payer: Self-pay

## 2023-01-28 ENCOUNTER — Other Ambulatory Visit (HOSPITAL_COMMUNITY): Payer: Self-pay

## 2023-01-28 MED ORDER — CYCLOBENZAPRINE HCL 10 MG PO TABS
10.0000 mg | ORAL_TABLET | Freq: Three times a day (TID) | ORAL | 1 refills | Status: DC | PRN
  Filled 2023-01-28: qty 36, 12d supply, fill #0

## 2023-02-09 ENCOUNTER — Other Ambulatory Visit (HOSPITAL_COMMUNITY): Payer: Self-pay

## 2023-02-09 DIAGNOSIS — Z369 Encounter for antenatal screening, unspecified: Secondary | ICD-10-CM | POA: Diagnosis not present

## 2023-02-09 LAB — OB RESULTS CONSOLE RPR: RPR: NONREACTIVE

## 2023-02-09 LAB — OB RESULTS CONSOLE HIV ANTIBODY (ROUTINE TESTING): HIV: NONREACTIVE

## 2023-02-19 ENCOUNTER — Other Ambulatory Visit: Payer: Self-pay | Admitting: *Deleted

## 2023-02-19 ENCOUNTER — Other Ambulatory Visit: Payer: Self-pay

## 2023-02-19 ENCOUNTER — Ambulatory Visit: Payer: Commercial Managed Care - PPO | Attending: Obstetrics and Gynecology

## 2023-02-19 DIAGNOSIS — Z8759 Personal history of other complications of pregnancy, childbirth and the puerperium: Secondary | ICD-10-CM | POA: Insufficient documentation

## 2023-02-19 DIAGNOSIS — O09523 Supervision of elderly multigravida, third trimester: Secondary | ICD-10-CM

## 2023-02-19 DIAGNOSIS — O365930 Maternal care for other known or suspected poor fetal growth, third trimester, not applicable or unspecified: Secondary | ICD-10-CM

## 2023-02-19 DIAGNOSIS — Z3A32 32 weeks gestation of pregnancy: Secondary | ICD-10-CM | POA: Diagnosis not present

## 2023-02-19 DIAGNOSIS — O99013 Anemia complicating pregnancy, third trimester: Secondary | ICD-10-CM | POA: Diagnosis not present

## 2023-02-19 DIAGNOSIS — E282 Polycystic ovarian syndrome: Secondary | ICD-10-CM

## 2023-02-19 DIAGNOSIS — O09293 Supervision of pregnancy with other poor reproductive or obstetric history, third trimester: Secondary | ICD-10-CM | POA: Diagnosis not present

## 2023-02-19 DIAGNOSIS — E669 Obesity, unspecified: Secondary | ICD-10-CM | POA: Diagnosis not present

## 2023-02-19 DIAGNOSIS — O34219 Maternal care for unspecified type scar from previous cesarean delivery: Secondary | ICD-10-CM | POA: Diagnosis not present

## 2023-02-19 DIAGNOSIS — D563 Thalassemia minor: Secondary | ICD-10-CM | POA: Diagnosis not present

## 2023-02-19 DIAGNOSIS — O99213 Obesity complicating pregnancy, third trimester: Secondary | ICD-10-CM | POA: Insufficient documentation

## 2023-03-10 ENCOUNTER — Encounter: Payer: Self-pay | Admitting: *Deleted

## 2023-03-10 DIAGNOSIS — O99210 Obesity complicating pregnancy, unspecified trimester: Secondary | ICD-10-CM | POA: Insufficient documentation

## 2023-03-16 ENCOUNTER — Other Ambulatory Visit (HOSPITAL_COMMUNITY): Payer: Self-pay

## 2023-03-16 DIAGNOSIS — Z2911 Encounter for prophylactic immunotherapy for respiratory syncytial virus (RSV): Secondary | ICD-10-CM | POA: Diagnosis not present

## 2023-03-16 DIAGNOSIS — Z3A35 35 weeks gestation of pregnancy: Secondary | ICD-10-CM | POA: Diagnosis not present

## 2023-03-16 DIAGNOSIS — O09523 Supervision of elderly multigravida, third trimester: Secondary | ICD-10-CM | POA: Diagnosis not present

## 2023-03-16 DIAGNOSIS — Z331 Pregnant state, incidental: Secondary | ICD-10-CM | POA: Diagnosis not present

## 2023-03-16 LAB — OB RESULTS CONSOLE GBS: GBS: NEGATIVE

## 2023-03-19 ENCOUNTER — Other Ambulatory Visit: Payer: Self-pay | Admitting: Obstetrics & Gynecology

## 2023-03-22 ENCOUNTER — Ambulatory Visit: Payer: Commercial Managed Care - PPO | Attending: Maternal & Fetal Medicine

## 2023-03-22 ENCOUNTER — Ambulatory Visit: Payer: Commercial Managed Care - PPO | Admitting: *Deleted

## 2023-03-22 ENCOUNTER — Encounter: Payer: Self-pay | Admitting: *Deleted

## 2023-03-22 ENCOUNTER — Other Ambulatory Visit: Payer: Self-pay

## 2023-03-22 DIAGNOSIS — O09523 Supervision of elderly multigravida, third trimester: Secondary | ICD-10-CM | POA: Insufficient documentation

## 2023-03-22 DIAGNOSIS — O365930 Maternal care for other known or suspected poor fetal growth, third trimester, not applicable or unspecified: Secondary | ICD-10-CM | POA: Diagnosis not present

## 2023-03-22 DIAGNOSIS — O09293 Supervision of pregnancy with other poor reproductive or obstetric history, third trimester: Secondary | ICD-10-CM

## 2023-03-22 DIAGNOSIS — O99283 Endocrine, nutritional and metabolic diseases complicating pregnancy, third trimester: Secondary | ICD-10-CM | POA: Diagnosis not present

## 2023-03-22 DIAGNOSIS — O99013 Anemia complicating pregnancy, third trimester: Secondary | ICD-10-CM

## 2023-03-22 DIAGNOSIS — E282 Polycystic ovarian syndrome: Secondary | ICD-10-CM | POA: Diagnosis not present

## 2023-03-22 DIAGNOSIS — D563 Thalassemia minor: Secondary | ICD-10-CM | POA: Insufficient documentation

## 2023-03-22 DIAGNOSIS — Z3A36 36 weeks gestation of pregnancy: Secondary | ICD-10-CM | POA: Diagnosis not present

## 2023-03-22 DIAGNOSIS — O34219 Maternal care for unspecified type scar from previous cesarean delivery: Secondary | ICD-10-CM | POA: Insufficient documentation

## 2023-03-23 NOTE — Patient Instructions (Signed)
Cynthia Figueroa  03/23/2023   Your procedure is scheduled on:  04/08/2023  Arrive at 1200 at Entrance C on [address] at [facility]  and [employer]. You are invited to use the FREE valet parking or use the Visitor's parking deck.  Pick up the phone at the desk and dial [phone].  Call this number if you have problems the morning of surgery: [phone]  Remember:   Do not eat food:(After Midnight) Desps de medianoche.  You may drink clear liquids until arrival at __1200___.  Clear liquids means a liquid you can see thru.  It can have color such as Cola or Kool aid.  Tea is OK and coffee as long as no milk or creamer of any kind.  Take these medicines the morning of surgery with A SIP OF WATER:  none   Do not wear jewelry, make-up or nail polish.  Do not wear lotions, powders, or perfumes. Do not wear deodorant.  Do not shave 48 hours prior to surgery.  Do not bring valuables to the hospital.  [facility] is not   responsible for any belongings or valuables brought to the hospital.  Contacts, dentures or bridgework may not be worn into surgery.  Leave suitcase in the car. After surgery it may be brought to your room.  For patients admitted to the hospital, checkout time is 11:00 AM the day of              discharge.      Please read over the following fact sheets that you were given:     Preparing for Surgery

## 2023-03-24 ENCOUNTER — Encounter (HOSPITAL_COMMUNITY): Payer: Self-pay

## 2023-03-24 ENCOUNTER — Telehealth (HOSPITAL_COMMUNITY): Payer: Self-pay | Admitting: *Deleted

## 2023-03-24 NOTE — Telephone Encounter (Signed)
Preadmission screen  

## 2023-03-26 ENCOUNTER — Encounter (HOSPITAL_COMMUNITY): Payer: Self-pay

## 2023-03-26 ENCOUNTER — Telehealth (HOSPITAL_COMMUNITY): Payer: Self-pay | Admitting: *Deleted

## 2023-03-26 NOTE — Telephone Encounter (Signed)
Preadmission screen  

## 2023-04-05 ENCOUNTER — Encounter (HOSPITAL_COMMUNITY)
Admission: RE | Admit: 2023-04-05 | Discharge: 2023-04-05 | Disposition: A | Discharge status: Home / Self Care | Payer: Commercial Managed Care - PPO | Source: Ambulatory Visit | Attending: Family Medicine | Admitting: Family Medicine

## 2023-04-05 VITALS — Ht 61.0 in | Wt 212.0 lb

## 2023-04-05 DIAGNOSIS — Z01812 Encounter for preprocedural laboratory examination: Secondary | ICD-10-CM | POA: Insufficient documentation

## 2023-04-05 DIAGNOSIS — O34219 Maternal care for unspecified type scar from previous cesarean delivery: Secondary | ICD-10-CM | POA: Insufficient documentation

## 2023-04-05 DIAGNOSIS — Z3A00 Weeks of gestation of pregnancy not specified: Secondary | ICD-10-CM | POA: Insufficient documentation

## 2023-04-05 HISTORY — DX: Family history of other specified conditions: Z84.89

## 2023-04-05 LAB — TYPE AND SCREEN
ABO/RH(D): O POS
Antibody Screen: NEGATIVE

## 2023-04-05 LAB — CBC
HCT: 35.5 % — ABNORMAL LOW (ref 36.0–46.0)
Hemoglobin: 11.4 g/dL — ABNORMAL LOW (ref 12.0–15.0)
MCH: 28.8 pg (ref 26.0–34.0)
MCHC: 32.1 g/dL (ref 30.0–36.0)
MCV: 89.6 fL (ref 80.0–100.0)
Platelets: 301 10*3/uL (ref 150–400)
RBC: 3.96 MIL/uL (ref 3.87–5.11)
RDW: 12.9 % (ref 11.5–15.5)
WBC: 12.5 10*3/uL — ABNORMAL HIGH (ref 4.0–10.5)
nRBC: 0 % (ref 0.0–0.2)

## 2023-04-05 LAB — RPR: RPR Ser Ql: NONREACTIVE

## 2023-04-07 NOTE — H&P (Incomplete)
Cynthia Figueroa is a 37 y.o. female presenting for a repeat Cesarean section and permanent sterilization via bilateral salpingectomy. [id number] at [redacted] weeks EGA. LMP 07/09/22 EDC 04/15/23 by LMP which was consistent with first trimester ultrasound.   Prenatal care provided at [facility] and has been benign course.    OB History     Gravida  4   Para  2   Term  2   Preterm  0   AB  1   Living  2      SAB  1   IAB  0   Ectopic  0   Multiple  0   Live Births  2          Past Medical History:  Diagnosis Date   Acute blood loss anemia 01/16/2022   Anxiety    Cesarean delivery delivered 12/04/2019   Depression 12/04/2019   Depressive disorder    Family history of adverse reaction to anesthesia    mom had high blood pressure   GERD (gastroesophageal reflux disease)    History of cesarean section    History of gestational hypertension    Infertility, female    Newborn product of in vitro fertilization (IVF) pregnancy    Normal postpartum course 01/16/2022   PCOS (polycystic ovarian syndrome)    PIH (pregnancy induced hypertension), third trimester 12/04/2019   Postpartum care following cesarean delivery 8/23 12/04/2019   Postpartum hemorrhage 01/16/2022   Pre-diabetes    Status post repeat low transverse cesarean section 01/15/2022   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   Vaginospasm    Vanishing twin syndrome    Vitamin D deficiency    Past Surgical History:  Procedure Laterality Date   CESAREAN SECTION N/A 12/04/2019   Procedure: CESAREAN SECTION;  Surgeon: Hoover Browns, MD;  Location: [facility] LD ORS;  Service: Obstetrics;  Laterality: N/A;   CESAREAN SECTION N/A 01/15/2022   Procedure: CESAREAN SECTION;  Surgeon: Hoover Browns, MD;  Location: [facility] LD ORS;  Service: Obstetrics;  Laterality: N/A;   Family History: family history includes  Alzheimer's disease in her maternal grandmother; Anemia in her mother and sister; Cancer in her maternal grandfather, paternal grandfather, and paternal grandmother; Diabetes in her maternal grandmother; Healthy in her father; Heart failure in her maternal grandmother; High blood pressure in her mother; Obesity in her mother; Thyroid disease in her maternal grandmother and mother. Social History:  reports that she has never smoked. She has never used smokeless tobacco. She reports that she does not drink alcohol and does not use drugs.     Maternal Diabetes: No Genetic Screening: Normal Maternal Ultrasounds/Referrals: Normal Fetal Ultrasounds or other Referrals:  None Maternal Substance Abuse:  No Significant Maternal Medications:  None Significant Maternal Lab Results:  Group B Strep negative Number of Prenatal Visits:greater than 3 verified prenatal visits Maternal Vaccinations: RSV. Other Comments:  None  Review of Systems Constitutional: Denies fevers/chills Cardiovascular: Denies chest pain or palpitations Pulmonary: Denies coughing or wheezing Gastrointestinal: Denies nausea, vomiting or diarrhea Genitourinary: Denies pelvic pain, unusual vaginal bleeding, unusual vaginal discharge, dysuria, urgency or frequency.  Musculoskeletal: Denies muscle or joint aches and pain.  Neurology: Denies abnormal sensations such as tingling or numbness.   History   Last menstrual period 07/09/2022, currently breastfeeding. Exam Physical Exam  Blood pressure 123/72, pulse 74, temperature 97.9 F (36.6 C), temperature source Oral,  resp. rate 16, height 5\' 1"  (1.549 m), weight 97.5 kg, last menstrual period 07/09/2022, SpO2 97%, currently breastfeeding. BMI 40.62 kg/m2 Constitutional: She is oriented to person, place, and time. She appears well-developed and well-nourished.  HENT:  Head: Normocephalic and atraumatic.  Neck: Normal range of motion.  Cardiovascular: Normal rate.    Respiratory:  Effort normal.   GI: Soft.  Skin: Skin is warm and dry.  Psychiatric: She has a normal mood and affect. Her behavior is normal.   Genitourinary: Gravid uterus, appropriate for gestational age.    Current Outpatient Medications  Medication Instructions   aspirin EC 81 mg, Oral, Daily, Swallow whole.   ferrous sulfate ER (SLOW FE) 142 (45 Fe) MG TBCR tablet 45 mg of iron, Oral, Daily   folic acid (FOLVITE) 1 mg, Oral, Daily   metoCLOPramide (REGLAN) 10 mg, Oral, Every 6 hours PRN   ondansetron (ZOFRAN) 4 mg, Oral, 4 times daily PRN   ondansetron (ZOFRAN-ODT) 4 MG disintegrating tablet dissolve 1 tablet  on tongue 4 (four) times daily as needed.   Prenatal Vit-Fe Fumarate-FA (PRENATAL PO) 2 each, Daily   sertraline (ZOLOFT) 50 mg, Oral, Daily   sertraline (ZOLOFT) 50 mg, Oral, Daily   Vitamin D3 100 mcg, Oral, Daily     Allergies  Allergen Reactions   Lamisil [Terbinafine] Hives      Prenatal labs: ABO, Rh: --/--/O POS (12/23 1052) Antibody: NEG (12/23 1052) Rubella: Immune (06/11 0000) RPR: NON REACTIVE (12/23 1040)  HBsAg: Negative (06/11 0000)  HIV: Non-reactive (10/29 0000)  GBS: Negative/-- (12/03 0000)    Recent Results (from the past 2160 hours)  OB RESULTS CONSOLE RPR     Status: None   Collection Time: 02/09/23 12:00 AM  Result Value Ref Range   RPR Nonreactive   OB RESULTS CONSOLE HIV antibody     Status: None   Collection Time: 02/09/23 12:00 AM  Result Value Ref Range   HIV Non-reactive   OB RESULT CONSOLE Group B Strep     Status: None   Collection Time: 03/16/23 12:00 AM  Result Value Ref Range   GBS Negative   CBC     Status: Abnormal   Collection Time: 04/05/23 10:40 AM  Result Value Ref Range   WBC 12.5 (H) 4.0 - 10.5 K/uL   RBC 3.96 3.87 - 5.11 MIL/uL   Hemoglobin 11.4 (L) 12.0 - 15.0 g/dL   HCT 01.0 (L) 27.2 - 53.6 %   MCV 89.6 80.0 - 100.0 fL   MCH 28.8 26.0 - 34.0 pg   MCHC 32.1 30.0 - 36.0 g/dL   RDW 64.4 03.4 - 74.2 %   Platelets 301 150  - 400 K/uL   nRBC 0.0 0.0 - 0.2 %    Comment: Performed at [facility], [address] [place], [place] [address]  RPR     Status: None   Collection Time: 04/05/23 10:40 AM  Result Value Ref Range   RPR Ser Ql NON REACTIVE NON REACTIVE    Comment: Performed at [facility], [address], [place], [place] [address]  Type and screen     Status: None   Collection Time: 04/05/23 10:52 AM  Result Value Ref Range   ABO/RH(D) O POS    Antibody Screen NEG    Sample Expiration      04/08/2023,2359 Performed at [facility], [address], [place], [place] [address]      Assessment/Plan:  37 y/o  G#P2002 at [redacted] weeks EGA here for a repeat cesarean delivery and sterilization via bilateral salpingectomy, - Admit to [facility] Labor and Delivery as per admit orders. - This procedure has been fully reviewed with the patient and written informed consent has been obtained.  We discussed risks of the procedure to include but not limited to risks of bleeding, infection, damage to organs.  - We discussed risks, benefits and alternatives of postpartum sterilization via bilateral salpingectomy including but not limited to risks of bleeding, infection, damage to organs.  She also understood the risk of tubal regret but she states she is 100% sure she did not want any more children. She also understood that there are other temporary methods of birth control, and also female sterilization and she did not desire these other options.   Ellin Goodie. 04/08/2023, 1:45 PM

## 2023-04-08 ENCOUNTER — Inpatient Hospital Stay (HOSPITAL_COMMUNITY): Payer: Commercial Managed Care - PPO | Admitting: Anesthesiology

## 2023-04-08 ENCOUNTER — Encounter (HOSPITAL_COMMUNITY)
Admission: AD | Disposition: A | Discharge status: Home / Self Care | Payer: Self-pay | Source: Home / Self Care | Attending: Obstetrics & Gynecology

## 2023-04-08 ENCOUNTER — Encounter (HOSPITAL_COMMUNITY): Payer: Self-pay | Admitting: Obstetrics & Gynecology

## 2023-04-08 ENCOUNTER — Other Ambulatory Visit: Payer: Self-pay

## 2023-04-08 ENCOUNTER — Inpatient Hospital Stay (HOSPITAL_COMMUNITY)
Admission: AD | Admit: 2023-04-08 | Discharge: 2023-04-11 | DRG: 785 | Disposition: A | Discharge status: Home / Self Care | Payer: Commercial Managed Care - PPO | Attending: Obstetrics & Gynecology | Admitting: Obstetrics & Gynecology

## 2023-04-08 DIAGNOSIS — Z3A39 39 weeks gestation of pregnancy: Secondary | ICD-10-CM

## 2023-04-08 DIAGNOSIS — O34211 Maternal care for low transverse scar from previous cesarean delivery: Secondary | ICD-10-CM

## 2023-04-08 DIAGNOSIS — Z8249 Family history of ischemic heart disease and other diseases of the circulatory system: Secondary | ICD-10-CM

## 2023-04-08 DIAGNOSIS — Z98891 History of uterine scar from previous surgery: Principal | ICD-10-CM

## 2023-04-08 DIAGNOSIS — Z7982 Long term (current) use of aspirin: Secondary | ICD-10-CM

## 2023-04-08 DIAGNOSIS — O99214 Obesity complicating childbirth: Secondary | ICD-10-CM | POA: Diagnosis present

## 2023-04-08 DIAGNOSIS — O9962 Diseases of the digestive system complicating childbirth: Secondary | ICD-10-CM | POA: Diagnosis present

## 2023-04-08 DIAGNOSIS — Z833 Family history of diabetes mellitus: Secondary | ICD-10-CM

## 2023-04-08 DIAGNOSIS — E66813 Obesity, class 3: Secondary | ICD-10-CM | POA: Diagnosis present

## 2023-04-08 DIAGNOSIS — Z883 Allergy status to other anti-infective agents status: Secondary | ICD-10-CM

## 2023-04-08 DIAGNOSIS — K219 Gastro-esophageal reflux disease without esophagitis: Secondary | ICD-10-CM | POA: Diagnosis present

## 2023-04-08 DIAGNOSIS — Z148 Genetic carrier of other disease: Secondary | ICD-10-CM

## 2023-04-08 DIAGNOSIS — Z302 Encounter for sterilization: Secondary | ICD-10-CM | POA: Diagnosis not present

## 2023-04-08 DIAGNOSIS — Z3A00 Weeks of gestation of pregnancy not specified: Secondary | ICD-10-CM | POA: Diagnosis not present

## 2023-04-08 DIAGNOSIS — O34219 Maternal care for unspecified type scar from previous cesarean delivery: Secondary | ICD-10-CM | POA: Diagnosis not present

## 2023-04-08 DIAGNOSIS — Z6841 Body Mass Index (BMI) 40.0 and over, adult: Secondary | ICD-10-CM | POA: Diagnosis not present

## 2023-04-08 DIAGNOSIS — Z79899 Other long term (current) drug therapy: Secondary | ICD-10-CM | POA: Diagnosis not present

## 2023-04-08 SURGERY — Surgical Case
Anesthesia: Spinal

## 2023-04-08 MED ORDER — FENTANYL CITRATE (PF) 100 MCG/2ML IJ SOLN
INTRAMUSCULAR | Status: AC
  Filled 2023-04-08: qty 2

## 2023-04-08 MED ORDER — OXYCODONE HCL 5 MG PO TABS
5.0000 mg | ORAL_TABLET | ORAL | Status: DC | PRN
Start: 2023-04-08 — End: 2023-04-11
  Administered 2023-04-08: 5 mg via ORAL
  Administered 2023-04-10: 10 mg via ORAL
  Administered 2023-04-10 (×2): 5 mg via ORAL
  Administered 2023-04-11: 10 mg via ORAL
  Filled 2023-04-08: qty 2
  Filled 2023-04-08: qty 1
  Filled 2023-04-08 (×3): qty 2

## 2023-04-08 MED ORDER — SIMETHICONE 80 MG PO CHEW
80.0000 mg | CHEWABLE_TABLET | ORAL | Status: DC | PRN
Start: 2023-04-08 — End: 2023-04-11
  Administered 2023-04-09 – 2023-04-11 (×2): 80 mg via ORAL
  Filled 2023-04-08 (×2): qty 1

## 2023-04-08 MED ORDER — CEFAZOLIN (ANCEF) 1 G IV SOLR: 2.0000 g | INTRAVENOUS | Status: DC

## 2023-04-08 MED ORDER — DIPHENHYDRAMINE HCL 50 MG/ML IJ SOLN: 12.5000 mg | INTRAMUSCULAR | Status: DC | PRN

## 2023-04-08 MED ORDER — SENNOSIDES-DOCUSATE SODIUM 8.6-50 MG PO TABS
2.0000 | ORAL_TABLET | Freq: Every day | ORAL | Status: DC
  Administered 2023-04-09 – 2023-04-11 (×3): 2 via ORAL
  Filled 2023-04-08 (×3): qty 2

## 2023-04-08 MED ORDER — TRANEXAMIC ACID-NACL 1000-0.7 MG/100ML-% IV SOLN
1000.0000 mg | INTRAVENOUS | Status: AC
  Administered 2023-04-08: 1000 mg via INTRAVENOUS

## 2023-04-08 MED ORDER — NALOXONE HCL 0.4 MG/ML IJ SOLN: 0.4000 mg | INTRAMUSCULAR | Status: DC | PRN

## 2023-04-08 MED ORDER — OXYTOCIN-SODIUM CHLORIDE 30-0.9 UT/500ML-% IV SOLN
INTRAVENOUS | Status: DC | PRN
  Administered 2023-04-08: 300 mL via INTRAVENOUS

## 2023-04-08 MED ORDER — ONDANSETRON HCL 4 MG/2ML IJ SOLN
INTRAMUSCULAR | Status: DC | PRN
  Administered 2023-04-08: 4 mg via INTRAVENOUS

## 2023-04-08 MED ORDER — KETOROLAC TROMETHAMINE 30 MG/ML IJ SOLN
INTRAMUSCULAR | Status: DC | PRN
  Administered 2023-04-08: 30 mg via INTRAVENOUS

## 2023-04-08 MED ORDER — ZOLPIDEM TARTRATE 5 MG PO TABS: 5.0000 mg | ORAL_TABLET | Freq: Every evening | ORAL | Status: DC | PRN

## 2023-04-08 MED ORDER — TETANUS-DIPHTH-ACELL PERTUSSIS 5-2.5-18.5 LF-MCG/0.5 IM SUSY: 0.5000 mL | PREFILLED_SYRINGE | Freq: Once | INTRAMUSCULAR | Status: DC

## 2023-04-08 MED ORDER — DIBUCAINE (PERIANAL) 1 % EX OINT: 1.0000 | TOPICAL_OINTMENT | CUTANEOUS | Status: DC | PRN

## 2023-04-08 MED ORDER — MORPHINE SULFATE (PF) 0.5 MG/ML IJ SOLN
INTRAMUSCULAR | Status: AC
  Filled 2023-04-08: qty 10

## 2023-04-08 MED ORDER — HYDROMORPHONE HCL 1 MG/ML IJ SOLN: 0.2000 mg | INTRAMUSCULAR | Status: DC | PRN

## 2023-04-08 MED ORDER — STERILE WATER FOR IRRIGATION IR SOLN
Status: DC | PRN
  Administered 2023-04-08: 1000 mL

## 2023-04-08 MED ORDER — SODIUM CHLORIDE 0.9% FLUSH: 3.0000 mL | INTRAVENOUS | Status: DC | PRN

## 2023-04-08 MED ORDER — ACETAMINOPHEN 10 MG/ML IV SOLN
INTRAVENOUS | Status: AC
  Filled 2023-04-08: qty 100

## 2023-04-08 MED ORDER — SOD CITRATE-CITRIC ACID 500-334 MG/5ML PO SOLN
ORAL | Status: AC
  Filled 2023-04-08: qty 30

## 2023-04-08 MED ORDER — PRENATAL MULTIVITAMIN CH
1.0000 | ORAL_TABLET | Freq: Every day | ORAL | Status: DC
  Administered 2023-04-09: 1 via ORAL
  Filled 2023-04-08 (×2): qty 1

## 2023-04-08 MED ORDER — FENTANYL CITRATE (PF) 100 MCG/2ML IJ SOLN
INTRAMUSCULAR | Status: DC | PRN
  Administered 2023-04-08: 15 ug via INTRATHECAL

## 2023-04-08 MED ORDER — MENTHOL 3 MG MT LOZG: 1.0000 | LOZENGE | OROMUCOSAL | Status: DC | PRN

## 2023-04-08 MED ORDER — ACETAMINOPHEN 10 MG/ML IV SOLN: 1000.0000 mg | Freq: Once | INTRAVENOUS | Status: DC | PRN

## 2023-04-08 MED ORDER — SODIUM CHLORIDE 0.9 % IR SOLN
Status: DC | PRN
  Administered 2023-04-08: 1

## 2023-04-08 MED ORDER — DEXAMETHASONE SODIUM PHOSPHATE 4 MG/ML IJ SOLN
INTRAMUSCULAR | Status: AC
  Filled 2023-04-08: qty 1

## 2023-04-08 MED ORDER — TRANEXAMIC ACID-NACL 1000-0.7 MG/100ML-% IV SOLN
INTRAVENOUS | Status: AC
  Filled 2023-04-08: qty 100

## 2023-04-08 MED ORDER — ONDANSETRON HCL 4 MG/2ML IJ SOLN: 4.0000 mg | Freq: Three times a day (TID) | INTRAMUSCULAR | Status: DC | PRN

## 2023-04-08 MED ORDER — ACETAMINOPHEN 500 MG PO TABS
1000.0000 mg | ORAL_TABLET | Freq: Four times a day (QID) | ORAL | Status: DC
  Filled 2023-04-08: qty 2

## 2023-04-08 MED ORDER — CEFAZOLIN SODIUM-DEXTROSE 2-4 GM/100ML-% IV SOLN
INTRAVENOUS | Status: AC
Start: 2023-04-08 — End: ?
  Filled 2023-04-08: qty 100

## 2023-04-08 MED ORDER — ACETAMINOPHEN 10 MG/ML IV SOLN
INTRAVENOUS | Status: DC | PRN
  Administered 2023-04-08: 1000 mg via INTRAVENOUS

## 2023-04-08 MED ORDER — WITCH HAZEL-GLYCERIN EX PADS: 1.0000 | MEDICATED_PAD | CUTANEOUS | Status: DC | PRN

## 2023-04-08 MED ORDER — ACETAMINOPHEN 500 MG PO TABS: 1000.0000 mg | ORAL_TABLET | Freq: Four times a day (QID) | ORAL | Status: DC

## 2023-04-08 MED ORDER — COCONUT OIL OIL: 1.0000 | TOPICAL_OIL | Status: DC | PRN

## 2023-04-08 MED ORDER — SOD CITRATE-CITRIC ACID 500-334 MG/5ML PO SOLN
30.0000 mL | ORAL | Status: AC
  Administered 2023-04-08: 30 mL via ORAL

## 2023-04-08 MED ORDER — IBUPROFEN 600 MG PO TABS
600.0000 mg | ORAL_TABLET | Freq: Four times a day (QID) | ORAL | Status: DC
  Administered 2023-04-09 – 2023-04-10 (×4): 600 mg via ORAL
  Filled 2023-04-08 (×4): qty 1

## 2023-04-08 MED ORDER — KETOROLAC TROMETHAMINE 30 MG/ML IJ SOLN
30.0000 mg | Freq: Four times a day (QID) | INTRAMUSCULAR | Status: DC | PRN
Start: 2023-04-08 — End: 2023-04-08

## 2023-04-08 MED ORDER — DIPHENHYDRAMINE HCL 25 MG PO CAPS
25.0000 mg | ORAL_CAPSULE | Freq: Four times a day (QID) | ORAL | Status: DC | PRN
Start: 2023-04-08 — End: 2023-04-11
  Administered 2023-04-08 – 2023-04-09 (×2): 25 mg via ORAL
  Filled 2023-04-08: qty 1

## 2023-04-08 MED ORDER — ENOXAPARIN SODIUM 60 MG/0.6ML IJ SOSY
50.0000 mg | PREFILLED_SYRINGE | INTRAMUSCULAR | Status: DC
  Administered 2023-04-10 – 2023-04-11 (×2): 50 mg via SUBCUTANEOUS
  Filled 2023-04-08 (×2): qty 0.6

## 2023-04-08 MED ORDER — MORPHINE SULFATE (PF) 0.5 MG/ML IJ SOLN
INTRAMUSCULAR | Status: DC | PRN
  Administered 2023-04-08: 150 ug via INTRATHECAL

## 2023-04-08 MED ORDER — DIPHENHYDRAMINE HCL 25 MG PO CAPS
25.0000 mg | ORAL_CAPSULE | ORAL | Status: DC | PRN
  Filled 2023-04-08: qty 1

## 2023-04-08 MED ORDER — SCOPOLAMINE 1 MG/3DAYS TD PT72: 1.0000 | MEDICATED_PATCH | Freq: Once | TRANSDERMAL | Status: DC

## 2023-04-08 MED ORDER — CEFAZOLIN SODIUM-DEXTROSE 2-4 GM/100ML-% IV SOLN
2.0000 g | INTRAVENOUS | Status: AC
  Administered 2023-04-08: 2 g via INTRAVENOUS

## 2023-04-08 MED ORDER — BUPIVACAINE IN DEXTROSE 0.75-8.25 % IT SOLN
INTRATHECAL | Status: DC | PRN
  Administered 2023-04-08: 1.8 mL via INTRATHECAL

## 2023-04-08 MED ORDER — ACETAMINOPHEN 160 MG/5ML PO SOLN
1000.0000 mg | Freq: Four times a day (QID) | ORAL | Status: DC
  Administered 2023-04-08 – 2023-04-11 (×10): 1000 mg via ORAL
  Filled 2023-04-08 (×11): qty 40.6

## 2023-04-08 MED ORDER — NALOXONE HCL 4 MG/10ML IJ SOLN: 1.0000 ug/kg/h | INTRAVENOUS | Status: DC | PRN

## 2023-04-08 MED ORDER — ONDANSETRON HCL 4 MG/2ML IJ SOLN
INTRAMUSCULAR | Status: AC
  Filled 2023-04-08: qty 2

## 2023-04-08 MED ORDER — PHENYLEPHRINE HCL-NACL 20-0.9 MG/250ML-% IV SOLN
INTRAVENOUS | Status: DC | PRN
  Administered 2023-04-08: 60 ug/min via INTRAVENOUS

## 2023-04-08 MED ORDER — DEXAMETHASONE SODIUM PHOSPHATE 10 MG/ML IJ SOLN
INTRAMUSCULAR | Status: DC | PRN
  Administered 2023-04-08: 4 mg via INTRAVENOUS

## 2023-04-08 MED ORDER — KETOROLAC TROMETHAMINE 30 MG/ML IJ SOLN
INTRAMUSCULAR | Status: AC
  Filled 2023-04-08: qty 1

## 2023-04-08 MED ORDER — LACTATED RINGERS IV SOLN
INTRAVENOUS | Status: AC
Start: 2023-04-08 — End: 2023-04-09

## 2023-04-08 MED ORDER — KETOROLAC TROMETHAMINE 30 MG/ML IJ SOLN
30.0000 mg | Freq: Four times a day (QID) | INTRAMUSCULAR | Status: AC
  Administered 2023-04-08 – 2023-04-09 (×3): 30 mg via INTRAVENOUS
  Filled 2023-04-08 (×3): qty 1

## 2023-04-08 MED ORDER — OXYTOCIN-SODIUM CHLORIDE 30-0.9 UT/500ML-% IV SOLN
2.5000 [IU]/h | INTRAVENOUS | Status: AC
  Administered 2023-04-08: 2.5 [IU]/h via INTRAVENOUS
  Filled 2023-04-08: qty 500

## 2023-04-08 MED ORDER — FENTANYL CITRATE (PF) 100 MCG/2ML IJ SOLN
25.0000 ug | INTRAMUSCULAR | Status: DC | PRN
  Administered 2023-04-08: 50 ug via INTRAVENOUS
  Administered 2023-04-08: 87.5 ug via INTRAVENOUS
  Administered 2023-04-08 (×2): 50 ug via INTRAVENOUS

## 2023-04-08 MED ORDER — PHENYLEPHRINE HCL-NACL 20-0.9 MG/250ML-% IV SOLN
INTRAVENOUS | Status: AC
  Filled 2023-04-08: qty 250

## 2023-04-08 SURGICAL SUPPLY — 38 items
BENZOIN TINCTURE PRP APPL 2/3 (GAUZE/BANDAGES/DRESSINGS) IMPLANT
CHLORAPREP W/TINT 26 (MISCELLANEOUS) ×2 IMPLANT
CLAMP UMBILICAL CORD (MISCELLANEOUS) ×1 IMPLANT
CLOTH BEACON ORANGE TIMEOUT ST (SAFETY) ×1 IMPLANT
DERMABOND ADVANCED .7 DNX12 (GAUZE/BANDAGES/DRESSINGS) ×1 IMPLANT
DISSECTOR SURG LIGASURE 21 (MISCELLANEOUS) IMPLANT
DRAPE C SECTION CLR SCREEN (DRAPES) ×1 IMPLANT
DRSG OPSITE POSTOP 4X10 (GAUZE/BANDAGES/DRESSINGS) ×1 IMPLANT
ELECT REM PT RETURN 9FT ADLT (ELECTROSURGICAL) ×1
ELECTRODE REM PT RTRN 9FT ADLT (ELECTROSURGICAL) ×1 IMPLANT
EXTRACTOR VACUUM KIWI (MISCELLANEOUS) ×1 IMPLANT
GLOVE BIOGEL PI IND STRL 7.0 (GLOVE) ×2 IMPLANT
GLOVE SURG SS PI 6.5 STRL IVOR (GLOVE) ×1 IMPLANT
GOWN STRL REUS W/TWL LRG LVL3 (GOWN DISPOSABLE) ×2 IMPLANT
KIT ABG SYR 3ML LUER SLIP (SYRINGE) IMPLANT
MAT PREVALON FULL STRYKER (MISCELLANEOUS) IMPLANT
NDL HYPO 25X5/8 SAFETYGLIDE (NEEDLE) IMPLANT
NDL KEITH (NEEDLE) ×1 IMPLANT
NEEDLE HYPO 25X5/8 SAFETYGLIDE (NEEDLE) IMPLANT
NEEDLE KEITH (NEEDLE) ×1 IMPLANT
NS IRRIG 1000ML POUR BTL (IV SOLUTION) ×1 IMPLANT
PACK C SECTION WH (CUSTOM PROCEDURE TRAY) ×1 IMPLANT
PAD OB MATERNITY 4.3X12.25 (PERSONAL CARE ITEMS) ×1 IMPLANT
RTRCTR C-SECT PINK 25CM LRG (MISCELLANEOUS) ×1 IMPLANT
STRIP CLOSURE SKIN 1/2X4 (GAUZE/BANDAGES/DRESSINGS) IMPLANT
SUT CHROMIC 1 CTX 36 (SUTURE) IMPLANT
SUT CHROMIC 2 0 CT 1 (SUTURE) ×1 IMPLANT
SUT MON AB 4-0 PS1 27 (SUTURE) IMPLANT
SUT PDS AB 0 CTX 60 (SUTURE) IMPLANT
SUT PLAIN 1 NONE 54 (SUTURE) ×1 IMPLANT
SUT PLAIN 2 0 XLH (SUTURE) IMPLANT
SUT PLAIN ABS 2-0 CT1 27XMFL (SUTURE) IMPLANT
SUT VIC AB 0 CTX36XBRD ANBCTRL (SUTURE) ×1 IMPLANT
SUT VIC AB 1 CTX36XBRD ANBCTRL (SUTURE) ×2 IMPLANT
SUTURE PLAIN GUT 2.0 ETHICON (SUTURE) IMPLANT
TOWEL OR 17X24 6PK STRL BLUE (TOWEL DISPOSABLE) ×1 IMPLANT
TRAY FOLEY W/BAG SLVR 14FR LF (SET/KITS/TRAYS/PACK) ×1 IMPLANT
WATER STERILE IRR 1000ML POUR (IV SOLUTION) ×1 IMPLANT

## 2023-04-08 NOTE — Lactation Note (Addendum)
This note was copied from a baby's chart. Lactation Consultation Note  Patient Name: Cynthia Figueroa RCVEL'F Date: 04/08/2023 Age:36 hours  MOB informed LC she plans to "Pump Only" and then formula feed infant. Would like LC services to return later due to family visiting. MOB will call for [facility] when ready to set up DEBP.    Maternal Data    Feeding Nipple Type: Slow - flow  LATCH Score                    Lactation Tools Discussed/Used    Interventions    Discharge    Consult Status      Frederico Hamman 04/08/2023, 7:33 PM

## 2023-04-08 NOTE — Anesthesia Procedure Notes (Signed)
Spinal  Patient location during procedure: OR Start time: 04/08/2023 3:00 PM End time: 04/08/2023 3:02 PM Reason for block: surgical anesthesia Staffing Performed: anesthesiologist  Anesthesiologist: Elmer Picker, MD Performed by: Elmer Picker, MD Authorized by: Elmer Picker, MD   Preanesthetic Checklist Completed: patient identified, IV checked, risks and benefits discussed, surgical consent, monitors and equipment checked, pre-op evaluation and timeout performed Spinal Block Patient position: sitting Prep: DuraPrep and site prepped and draped Patient monitoring: cardiac monitor, continuous pulse ox and blood pressure Approach: midline Location: L3-4 Injection technique: single-shot Needle Needle type: Pencan  Needle gauge: 24 G Needle length: 9 cm Assessment Sensory level: T6 Events: CSF return Additional Notes Functioning IV was confirmed and monitors were applied. Sterile prep and drape, including hand hygiene and sterile gloves were used. The patient was positioned and the spine was prepped. The skin was anesthetized with lidocaine.  Free flow of clear CSF was obtained prior to injecting local anesthetic into the CSF.  The spinal needle aspirated freely following injection.  The needle was carefully withdrawn.  The patient tolerated the procedure well.

## 2023-04-08 NOTE — Op Note (Addendum)
Patient:  Cynthia Figueroa DOB: Jun 05, 1985 MRN:  [id number]  DATE OF SURGERY:  04/08/2023  PREOP DIAGNOSIS:  1. 39 week 0 day EGA IUP. 2.  History of 2 previous cesarean sections and is for a repeat cesarean delivery. 3. Multiparous patient desiring permanent sterilization.  4. BMI 40.  POSTOP DIAGNOSIS: Same as above.    PROCEDURE:  1. Repeat low uterine segment transverse cesarean section via Pfannenstiel incision.  2. Postpartum bilateral tubal ligation via bilateral complete salpingectomy.   SURGEON: Dr.  Hoover Browns.   ASSISTANT: Ob fellow, Dr. Mittie Bodo.   SURGEON ATTESTATION: I was present and scrubbed for the entire case.  Experienced assistants was required given the standard of surgical care and the complexity of the case.  The assistants were needed for exposure, dissection, suctioning, retraction, instrument exchange,  assisting with delivery with administration of fundal pressure, and for overall help during the surgery.    ANESTHESIA: Spinal, Dr. Willette Alma.  COMPLICATIONS: None  FINDINGS: Viable female infant in cephalic presentation, DOA position, weight 6lbs 6.7 oz, Apgar scores of  9 and 9.  Normal left and right fallopian tubes.    EBL:   347 cc  IV FLUID:   1800 cc LR   URINE OUTPUT:  125 cc clear urine  INDICATIONS:  37 y/o Para 2 who presented for a repeat cesarean and bilateral tubal ligation at [redacted] weeks EGA.  She was consented for the procedures after explaining risks benefits and alternatives of the procedures including but not limited to risks of heavy bleeding, infection and damage to organs.  We additionally discussed risks of tubal ligation regret but she stated she was 100% sure she did not want any more children.  She understood there were other kinds of birth control such as pills, patches, IUDs, vaginal rings and depo provera which were temporary but she did not desire them.  She understood there was also an option of female sterilization but she  did not desire that option either.  She desired bilateral complete salpingectomy for sterilization.  She did not desire partial salpingectomy for sterilization.  All her questions were answered pre-operatively and consent forms were signed.  Marland Kitchen   PROCEDURE:  She was taken to the operating room where her spinal anesthesia was found to be adequate. She was prepped abdominally with chloraprep and vaginally with betadine.  She was draped in the usual sterile fashion, foley catheter and SCDs was placed. She received IV ancef and IV tranexamic acid preoperatively. A pfannenstiel incision was made over the previous incision with a scalpel and extended through the subcutaneous layer and also the fascia with the bovie. Small perforators in the subcutaneous layer were contained with the Bovie. The fascia was nicked in the midline and then was further separated from the rectus muscles bilaterally using Mayo scissors. Kochers were placed inferiorly and then superiorly to allow further separation of fascia from the rectus muscles with Mayo scissors.  The peritoneal cavity was entered bluntly with hemostats and stretched out.  The Alexis retractor was placed in. The vesicouterine fold dissection could not be done.  The uterus was incised with a scalpel transversely above the vesico -uterine fold and the incision was extended bluntly bilaterally with fingers and then with bandage scissors.    Membranes were ruptured and moderate clear amniotic fluid was noted.  The head was delivered with head flexion (delivered DOP) and fundal pressure then the rest of the body was delivered with fundal pressure.  She delivered a  viable female infant, apgar scores 9, 9.  The edges of the uterus was grasped with T clamps.  The cord was clamped and cut after 1 minute. Cord blood was collected.    The uterus was not exteriorized.  The placenta was delivered with gentle traction on the umbilical cord.  The uterus was cleared of clots and debris  with a lap.  The uterine incision was closed with #1 Vicryl in a running locked stitch. A small area that bled on the right side was contained with figure of 8 stitch.  A lap was placed over the incision and attention turned to the tubes.    The right fallopian tube was then identified, brought to the incision and followed up to the fimbria end.    The small hand held Ligasure impact was used to completely transect the fallopian tube from the mesosalpinx leaving about a 1 cm cornual stump.  Excellent hemostasis was noted on the remaining pedicles.  A similar procedure was done to remove the left fallopian tube.  Both uterine cornua and remaining mesosalpinx were hemostatic.  Irrigation was applied and suctioned out.  The uterine incision was noted to be hemostatic.  The peritoneum was then reapproximated using 2-0 chromic suture.  Rectus muscles were noted to thinned out and far apart and could not be re approximated.  Fascia was closed using 0 looped PDS.  The subcutaneous layer was irrigated and suctioned out. Small perforators were contained with the bovie.  The subcutaneous layer was closed using 1-0 plain in interrupted stitches.  4- 0 monocryl was used to close the incision in a subcuticular stitch.  Patient was cleaned and dried.  Honey comb dressing was applied.   She was further cleaned and then taken to the recovery room with her baby in stable conditions.   SPECIMEN:  Placenta to labor and delivery.  Umbilical cord blood to lab.  Left and right fallopian tubes to pathology. .   DISPOSITION: TO PACU, STABLE.   Dr. Hoover Browns.  Date: 04/08/2023.

## 2023-04-08 NOTE — Transfer of Care (Signed)
Immediate Anesthesia Transfer of Care Note  Patient: Cynthia Figueroa  Procedure(s) Performed: CESAREAN SECTION WITH BILATERAL TUBAL LIGATION  Patient Location: PACU  Anesthesia Type:Spinal  Level of Consciousness: awake  Airway & Oxygen Therapy: Patient Spontanous Breathing  Post-op Assessment: Report given to RN and Post -op Vital signs reviewed and stable  Post vital signs: Reviewed and stable  Last Vitals:  Vitals Value Taken Time  BP 123/72 04/08/23 1700  Temp    Pulse 60 04/08/23 1702  Resp 23 04/08/23 1702  SpO2 93 % 04/08/23 1702  Vitals shown include unfiled device data.  Last Pain:  Vitals:   04/08/23 1304  TempSrc: Oral         Complications: No notable events documented.

## 2023-04-08 NOTE — Lactation Note (Signed)
This note was copied from a baby's chart. Lactation Consultation Note  Patient Name: Girl Latrinda Caraveo IONGE'X Date: 04/08/2023 Age:36 hours  LC 2nd visit, MOB is tired and would like to be set up with using DEBP in the morning. MOB feeding preference is "Pumping Only and formula",  feeding infant.    Maternal Data    Feeding Nipple Type: Slow - flow  LATCH Score                    Lactation Tools Discussed/Used    Interventions    Discharge    Consult Status      Frederico Hamman 04/08/2023, 10:25 PM

## 2023-04-08 NOTE — Anesthesia Preprocedure Evaluation (Addendum)
Anesthesia Evaluation  Patient identified by MRN, date of birth, ID band Patient awake    Reviewed: Allergy & Precautions, NPO status , Patient's Chart, lab work & pertinent test results  Airway Mallampati: III  TM Distance: >3 FB Neck ROM: Full    Dental no notable dental hx. (+) Teeth Intact, Dental Advisory Given   Pulmonary neg pulmonary ROS   Pulmonary exam normal breath sounds clear to auscultation       Cardiovascular hypertension, Normal cardiovascular exam Rhythm:Regular Rate:Normal     Neuro/Psych  PSYCHIATRIC DISORDERS Anxiety Depression    negative neurological ROS     GI/Hepatic Neg liver ROS,GERD  ,,  Endo/Other    Class 3 obesity (BMI 40)PCOS  Renal/GU negative Renal ROS  negative genitourinary   Musculoskeletal negative musculoskeletal ROS (+)    Abdominal   Peds  Hematology negative hematology ROS (+)   Anesthesia Other Findings Repeat C/Sx2  Reproductive/Obstetrics (+) Pregnancy                             Anesthesia Physical Anesthesia Plan  ASA: 3  Anesthesia Plan: Spinal   Post-op Pain Management:    Induction:   PONV Risk Score and Plan: Treatment may vary due to age or medical condition  Airway Management Planned: Natural Airway  Additional Equipment:   Intra-op Plan:   Post-operative Plan:   Informed Consent: I have reviewed the patients History and Physical, chart, labs and discussed the procedure including the risks, benefits and alternatives for the proposed anesthesia with the patient or authorized representative who has indicated his/her understanding and acceptance.     Dental advisory given  Plan Discussed with: CRNA  Anesthesia Plan Comments:        Anesthesia Quick Evaluation

## 2023-04-09 ENCOUNTER — Encounter (HOSPITAL_COMMUNITY): Payer: Self-pay | Admitting: Obstetrics & Gynecology

## 2023-04-09 LAB — CBC
HCT: 31.2 % — ABNORMAL LOW (ref 36.0–46.0)
Hemoglobin: 10 g/dL — ABNORMAL LOW (ref 12.0–15.0)
MCH: 28.5 pg (ref 26.0–34.0)
MCHC: 32.1 g/dL (ref 30.0–36.0)
MCV: 88.9 fL (ref 80.0–100.0)
Platelets: 272 10*3/uL (ref 150–400)
RBC: 3.51 MIL/uL — ABNORMAL LOW (ref 3.87–5.11)
RDW: 12.8 % (ref 11.5–15.5)
WBC: 14.1 10*3/uL — ABNORMAL HIGH (ref 4.0–10.5)
nRBC: 0 % (ref 0.0–0.2)

## 2023-04-09 LAB — CREATININE, SERUM
Creatinine, Ser: 0.57 mg/dL (ref 0.44–1.00)
GFR, Estimated: >60 mL/min (ref >60)

## 2023-04-09 MED ORDER — SERTRALINE HCL 50 MG PO TABS
50.0000 mg | ORAL_TABLET | Freq: Every day | ORAL | Status: DC
  Administered 2023-04-09 – 2023-04-10 (×2): 50 mg via ORAL
  Filled 2023-04-09 (×2): qty 1

## 2023-04-09 NOTE — Anesthesia Postprocedure Evaluation (Signed)
Anesthesia Post Note  Patient: Hotel manager  Procedure(s) Performed: CESAREAN SECTION WITH BILATERAL TUBAL LIGATION     Patient location during evaluation: PACU Anesthesia Type: Spinal Level of consciousness: oriented and awake and alert Pain management: pain level controlled Vital Signs Assessment: post-procedure vital signs reviewed and stable Respiratory status: spontaneous breathing, respiratory function stable and patient connected to nasal cannula oxygen Cardiovascular status: blood pressure returned to baseline and stable Postop Assessment: no headache, no backache and no apparent nausea or vomiting Anesthetic complications: no  No notable events documented.  Last Vitals:  Vitals:   04/09/23 0759 04/09/23 1255  BP: 110/68 (!) 109/55  Pulse: (!) 55 73  Resp: 16 17  Temp: 36.9 C 37 C  SpO2:  97%    Last Pain:  Vitals:   04/09/23 1513  TempSrc:   PainSc: 0-No pain   Pain Goal:                   Terisa Belardo L Jamarria Real

## 2023-04-09 NOTE — Lactation Note (Signed)
This note was copied from a baby's chart. Lactation Consultation Note  Patient Name: Cynthia Figueroa CZYSA'Y Date: 04/09/2023 Age:35 hours Reason for consult: Initial assessment;Exclusive pumping and bottle feeding;Term;Maternal endocrine disorder  P4- MOB reports that she will be exclusively pumping for infant, as well as formula feeding. MOB states that she does not think infant likes the formula. MOB reports that she exclusively pumped and formula fed all of her children. MOB also reports that her supply was not great with her other children, but she believes it is because she was working the pump incorrectly.   MOB has not yet been set up with a DEBP. MOB has multiple family members in the room at this time, so LC offered to at least set up the pump for when MOB is ready to use it. MOB agreed. When LC asked MOB about her flange size, she stated that she believes she is a 24 mm flange. LC will size her when MOB calls for education on the pump once her family leaves. LC set up the DEBP with size 24 mm flanges. LC encouraged MOB to call when she is ready. MOB agreed.  LC reviewed CDC milk storage guidelines and LC services handout.  Maternal Data Has patient been taught Hand Expression?: No Does the patient have breastfeeding experience prior to this delivery?: Yes How long did the patient breastfeed?: MOB reports "not long"  Feeding Mother's Current Feeding Choice: Breast Milk and Formula  Lactation Tools Discussed/Used Tools: Pump;Flanges Flange Size: 24 (MOB reports that she is a 24 mm flange, but LC will size her correctly when MOB calls after family leaves.) Breast pump type: Double-Electric Breast Pump;Manual Pump Education: Setup, frequency, and cleaning;Milk Storage Reason for Pumping: MOB request (exclusive pumping) Pumping frequency: 15-20 min every 2-3 hrs  Interventions Interventions: Breast feeding basics reviewed;DEBP;Hand pump;Education;LC Services  brochure  Discharge Discharge Education: Warning signs for feeding baby Pump: DEBP;Personal  Consult Status Consult Status: Follow-up Date: 04/10/23 Follow-up type: In-patient    Dema Severin BS, IBCLC 04/09/2023, 3:42 PM

## 2023-04-09 NOTE — Progress Notes (Signed)
Subjective: POD# 1 Information for the patient's newborn:  Cynthia Figueroa, Cynthia Figueroa [[id number]]  female  Reports feeling good, somewhat tired Feeding: breast and formula Reports tolerating PO and denies N/V, foley removed, ambulating and urinating w/o difficulty  Pain controlled with  PO meds Denies HA/SOB/dizziness  Flatus passing Vaginal bleeding is normal, no clots     Objective:  VS:  Vitals:   04/09/23 0300 04/09/23 0409 04/09/23 0507 04/09/23 0759  BP:    110/68  Pulse:    (!) 55  Resp: 18 18 18 16   Temp:  98.2 F (36.8 C)  98.5 F (36.9 C)  TempSrc:  Oral  Oral  SpO2: 99% 99% 98%   Weight:      Height:        Intake/Output Summary (Last 24 hours) at 04/09/2023 0954 Last data filed at 04/09/2023 0759 Gross per 24 hour  Intake 3300 ml  Output 1727 ml  Net 1573 ml     Recent Labs    04/09/23 0451  WBC 14.1*  HGB 10.0*  HCT 31.2*  PLT 272    Blood type: --/--/O POS (12/23 1052) Rubella: Immune (06/11 0000)    Physical Exam:  General: alert, cooperative, and no distress CV: Regular rate and rhythm Resp: clear Abdomen: soft, nontender, normal bowel sounds Incision:  honeycomb dressing is C/D/I Perineum: intact Uterine Fundus: firm, below umbilicus, nontender Lochia: minimal and no clots Ext: trace edema, negative for tenderness, pain, and cords   Assessment/Plan: 37 y.o.   POD# 1. [id number]                  Principal Problem:   History of low transverse cesarean section Bilateral salpingectomy  Routine post-op PP care          Advance diet as tolerated Advised warm fluids and ambulation to improve GI motility Lactation support PRN Anticipate D/C on POD 2 or 3  Roma Schanz, DNP, CNM 04/09/2023, 9:54 AM

## 2023-04-10 ENCOUNTER — Encounter (HOSPITAL_COMMUNITY): Payer: Self-pay | Admitting: Obstetrics & Gynecology

## 2023-04-10 MED ORDER — IBUPROFEN 100 MG/5ML PO SUSP
600.0000 mg | Freq: Four times a day (QID) | ORAL | Status: DC
  Administered 2023-04-10 – 2023-04-11 (×4): 600 mg via ORAL
  Filled 2023-04-10 (×5): qty 30

## 2023-04-10 MED ORDER — CHILDRENS CHEW MULTIVITAMIN PO CHEW
1.0000 | CHEWABLE_TABLET | Freq: Every day | ORAL | Status: DC
  Administered 2023-04-10: 1 via ORAL
  Filled 2023-04-10 (×2): qty 1

## 2023-04-10 NOTE — Progress Notes (Signed)
Subjective: POD# 2 Information for the patient's newborn:  Cynthia Figueroa, Cynthia Figueroa [[id number]]  female    Reports feeling "better than yesterday" Feeding: breast and formula Reports tolerating PO and denies N/V, foley removed, ambulating and urinating w/o difficulty  Pain controlled with  PO meds Denies HA/SOB/dizziness  Flatus passing Vaginal bleeding is normal, no clots     Objective:  VS:  Vitals:   04/09/23 0507 04/09/23 0759 04/09/23 1255 04/09/23 2012  BP:  110/68 (!) 109/55 108/61  Pulse:  (!) 55 73 82  Resp: 18 16 17 17   Temp:  98.5 F (36.9 C) 98.6 F (37 C) 98.4 F (36.9 C)  TempSrc:  Oral Oral Oral  SpO2: 98%  97% 98%  Weight:      Height:        Intake/Output Summary (Last 24 hours) at 04/10/2023 0617 Last data filed at 04/09/2023 1513 Gross per 24 hour  Intake 1885.65 ml  Output 1200 ml  Net 685.65 ml     Recent Labs    04/09/23 0451  WBC 14.1*  HGB 10.0*  HCT 31.2*  PLT 272    Blood type: --/--/O POS (12/23 1052) Rubella: Immune (06/11 0000)    Physical Exam:  General: alert, cooperative, and no distress CV: Regular rate and rhythm Resp: clear Abdomen: soft, nontender, normal bowel sounds Incision: clean, dry, and intact Perineum:  Uterine Fundus: firm, below umbilicus, nontender Lochia: minimal Ext: trace edema, negative for tenderness, pain, and cords   Assessment/Plan: 37 y.o.   POD# 2. [id number]                  Principal Problem:   History of low transverse cesarean section   Routine post-op PP care          Advance diet as tolerated Advised warm fluids and ambulation to improve GI motility Lactation support PRN Anticipate D/C POD 3  Roma Schanz, DNP, CNM 04/10/2023, 6:17 AM

## 2023-04-10 NOTE — Lactation Note (Signed)
This note was copied from a baby's chart. Lactation Consultation Note  Patient Name: Cynthia Figueroa WUJWJ'[id number] Date: 04/10/2023 Age:35 hours   P3- MOB has been bottle feeding only. LC set up DEBP yesterday evening and encouraged her to pump since she stated that she wants to exclusively pump. According to MOB's day shift RN, MOB had not pumped at all today. LC attempted to consult with MOB at this time, but she was sleeping. Lactation team will see her sometime tomorrow.   Feeding Nipple Type: Slow - flow   Dema Severin BS, IBCLC 04/10/2023, 8:50 PM

## 2023-04-11 ENCOUNTER — Other Ambulatory Visit (HOSPITAL_COMMUNITY): Payer: Self-pay

## 2023-04-11 ENCOUNTER — Other Ambulatory Visit: Payer: Self-pay

## 2023-04-11 MED ORDER — SENNOSIDES-DOCUSATE SODIUM 8.6-50 MG PO TABS
2.0000 | ORAL_TABLET | Freq: Every day | ORAL | 1 refills | Status: AC
  Filled 2023-04-11 – 2023-07-11 (×2): qty 60, 30d supply, fill #0

## 2023-04-11 MED ORDER — IBUPROFEN 100 MG/5ML PO SUSP
600.0000 mg | Freq: Four times a day (QID) | ORAL | 1 refills | Status: AC
  Filled 2023-04-11: qty 3658, 31d supply, fill #0
  Filled 2023-07-11: qty 3600, 30d supply, fill #0

## 2023-04-11 MED ORDER — OXYCODONE HCL 5 MG PO TABS
5.0000 mg | ORAL_TABLET | ORAL | 0 refills | Status: AC | PRN
  Filled 2023-04-11: qty 30, 3d supply, fill #0

## 2023-04-11 MED ORDER — ACETAMINOPHEN 160 MG/5ML PO SOLN
1000.0000 mg | Freq: Four times a day (QID) | ORAL | 0 refills | Status: AC
  Filled 2023-04-11 – 2023-07-11 (×2): qty 236, 2d supply, fill #0

## 2023-04-11 NOTE — Lactation Note (Signed)
This note was copied from a baby's chart. Lactation Consultation Note  Patient Name: Cynthia Figueroa FIEPP'I Date: 04/11/2023 Age:35 hours Reason for consult: Follow-up assessment;Exclusive pumping and bottle feeding;Term;Infant weight loss As LC entered the room, per mom felt like her breast are engorged.  LC offered to assess for engorgement , Breast noted to be full and borderline engorged on lateral aspects. Mom had been using heating pads and LC had her pump for 15 mins with 1 drop EBM yield and then iced afterwards.  LC reviewed engorgement prevention and tx, supply and demand, and importance of consistent pumping 8-10 times a day.  Mom aware of LC resources.   Maternal Data    Feeding Mother's Current Feeding Choice: Breast Milk and Formula Nipple Type: Slow - flow   Lactation Tools Discussed/Used Tools: Pump;Flanges Flange Size: 18;21 (LC resized the flange and the best fit was #18 F and #21 F when the milk comes in. Mom aware she will have to order the Nursi Luna inserts for her Motif DEBP) Breast pump type: Double-Electric Breast Pump;Manual Pump Education: Milk Storage;Setup, frequency, and cleaning Pumped volume: 1 mL (1 drop)  Interventions Interventions: Breast feeding basics reviewed;Hand pump;DEBP;Education;LC Services brochure;CDC Guidelines for Breast Pump Cleaning  Discharge Discharge Education: Engorgement and breast care Pump: Personal;DEBP;Manual  Consult Status Consult Status: Complete Date: 04/11/23    Cynthia Figueroa 04/11/2023, 12:10 PM

## 2023-04-11 NOTE — Discharge Summary (Signed)
Postpartum Discharge Summary  Date of Service updated12/29/24     Patient Name: Cynthia Figueroa DOB: Jun 28, 1985 MRN: [id number]  Date of admission: 04/08/2023 Delivery date:04/08/2023 Delivering provider: Hoover Browns Date of discharge: 04/11/2023  Admitting diagnosis: History of low transverse cesarean section [Z98.891] Intrauterine pregnancy: [redacted]w[redacted]d     Secondary diagnosis:  Principal Problem:   History of low transverse cesarean section  Additional problems: na    Discharge diagnosis: Term Pregnancy Delivered                                              Post partum procedures: b salpingectomy with CS Augmentation: N/A Complications: None  Hospital course: Sceduled C/S   37 y.o. yo [id number] at [redacted]w[redacted]d was admitted to the hospital 04/08/2023 for scheduled cesarean section with the following indication:Elective Repeat.Delivery details are as follows:  Membrane Rupture Time/Date: 3:36 PM,04/08/2023  Delivery Method:C-Section, Low Transverse Operative Delivery:N/A Details of operation can be found in separate operative note.  Patient had a postpartum course complicated bynothing.  She is ambulating, tolerating a regular diet, passing flatus, and urinating well. Patient is discharged home in stable condition on  04/11/23        Newborn Data: Birth date:04/08/2023 Birth time:3:37 PM Gender:Female Living status:Living Apgars:9 ,9  Weight:2910 g    Magnesium Sulfate received: No BMZ received: No Rhophylac:No MMR:No T-DaP:   Flu: No RSV Vaccine received: No Transfusion:No Immunizations administered: Immunization History  Administered Date(s) Administered   Influenza,inj,Quad PF,6+ Mos 01/18/2022   PFIZER(Purple Top)SARS-COV-2 Vaccination 12/07/2019, 12/28/2019   Rsv, Bivalent, Protein Subunit Rsvpref,pf Cynthia Figueroa) 03/16/2023   Tdap 09/19/2019    Physical exam  Vitals:   04/10/23 0628 04/10/23 1506 04/10/23 2103 04/11/23 0450  BP: 116/73 110/66 (!) 105/51 109/66  Pulse:  62 74 80 71  Resp: 18 17 18 16   Temp:  98.2 F (36.8 C) 98.3 F (36.8 C)   TempSrc:  Oral Oral   SpO2:  99% 98% 99%  Weight:      Height:       General: alert and cooperative Lochia: appropriate Uterine Fundus: firm Incision: Healing well with no significant drainage DVT Evaluation: Negative Homan's sign. Labs: Lab Results  Component Value Date   WBC 14.1 (H) 04/09/2023   HGB 10.0 (L) 04/09/2023   HCT 31.2 (L) 04/09/2023   MCV 88.9 04/09/2023   PLT 272 04/09/2023      Latest Ref Rng & Units 04/09/2023    4:51 AM  CMP  Creatinine 0.44 - 1.00 mg/dL 1.47    Edinburgh Score:    04/09/2023    4:09 AM  Edinburgh Postnatal Depression Scale Screening Tool  I have been able to laugh and see the funny side of things. 0  I have looked forward with enjoyment to things. 0  I have blamed myself unnecessarily when things went wrong. 0  I have been anxious or worried for no good reason. 0  I have felt scared or panicky for no good reason. 1  Things have been getting on top of me. 0  I have been so unhappy that I have had difficulty sleeping. 0  I have felt sad or miserable. 0  I have been so unhappy that I have been crying. 0  The thought of harming myself has occurred to me. 0  Edinburgh Postnatal Depression Scale Total 1  After visit meds:  Allergies as of 04/11/2023       Reactions   Lamisil [terbinafine] Hives        Medication List     STOP taking these medications    aspirin EC 81 MG tablet   ondansetron 4 MG disintegrating tablet Commonly known as: ZOFRAN-ODT       TAKE these medications    acetaminophen 160 MG/5ML solution Commonly known as: TYLENOL Take 31.3 mLs (1,000 mg total) by mouth every 6 (six) hours.   ferrous sulfate ER 142 (45 Fe) MG Tbcr tablet Commonly known as: Slow Fe Take 1 tablet (45 mg of iron total) by mouth daily.   folic acid 1 MG tablet Commonly known as: FOLVITE Take 1 mg by mouth daily.   ibuprofen 100 MG/5ML  suspension Commonly known as: ADVIL Take 30 mLs (600 mg total) by mouth every 6 (six) hours.   metoCLOPramide 10 MG tablet Commonly known as: Reglan Take 1 tablet (10 mg total) by mouth every 6 (six) hours as needed.   ondansetron 4 MG tablet Commonly known as: Zofran Take 1 tablet (4 mg total) by mouth 4 (four) times daily as needed.   oxyCODONE 5 MG immediate release tablet Commonly known as: Oxy IR/ROXICODONE Take 1-2 tablets (5-10 mg total) by mouth every 4 (four) hours as needed for moderate pain (pain score 4-6).   PRENATAL PO Take 2 each by mouth daily.   senna-docusate 8.6-50 MG tablet Commonly known as: Senokot-S Take 2 tablets by mouth daily. Start taking on: April 12, 2023   sertraline 50 MG tablet Commonly known as: ZOLOFT Take 1 tablet (50 mg total) by mouth daily. What changed: Another medication with the same name was removed. Continue taking this medication, and follow the directions you see here.   Vitamin D3 50 MCG (2000 UT) Tabs Take 2 tablets (100 mcg total) by mouth daily.         Discharge home in stable condition Infant Feeding: Bottle and Breast Infant Disposition:home with mother Discharge instruction: per After Visit Summary and Postpartum booklet. Activity: Advance as tolerated. Pelvic rest for 6 weeks.  Diet: routine diet Anticipated Birth Control:  salpingecotmy Postpartum Appointment:6 weeks Additional Postpartum F/U:    Future Appointments:No future appointments. Follow up Visit:  Follow-up Information     Hoover Browns, MD. Schedule an appointment as soon as possible for a visit in 6 week(s).   Specialty: Obstetrics and Gynecology Contact information: [address] [place] [address] [phone]                     04/11/2023 Cynthia Litter, MD

## 2023-04-12 ENCOUNTER — Other Ambulatory Visit (HOSPITAL_COMMUNITY): Payer: Self-pay

## 2023-04-12 LAB — SURGICAL PATHOLOGY

## 2023-04-16 ENCOUNTER — Telehealth (HOSPITAL_COMMUNITY): Payer: Self-pay | Admitting: *Deleted

## 2023-04-16 NOTE — Telephone Encounter (Signed)
 Attempted hospital discharge follow-up call. Left message for patient to return RN call with any questions or concerns. Deforest Hoyles, RN, 04/16/23, 1016

## 2023-05-06 ENCOUNTER — Other Ambulatory Visit (HOSPITAL_COMMUNITY): Payer: Self-pay

## 2023-05-06 ENCOUNTER — Encounter: Payer: Self-pay | Admitting: Pharmacist

## 2023-05-06 ENCOUNTER — Other Ambulatory Visit: Payer: Self-pay

## 2023-05-07 ENCOUNTER — Other Ambulatory Visit: Payer: Self-pay

## 2023-05-19 DIAGNOSIS — Z1331 Encounter for screening for depression: Secondary | ICD-10-CM | POA: Diagnosis not present

## 2023-07-12 ENCOUNTER — Other Ambulatory Visit (HOSPITAL_COMMUNITY): Payer: Self-pay

## 2023-07-12 ENCOUNTER — Other Ambulatory Visit: Payer: Self-pay

## 2023-07-20 ENCOUNTER — Other Ambulatory Visit (HOSPITAL_COMMUNITY): Payer: Self-pay

## 2023-07-20 ENCOUNTER — Encounter (HOSPITAL_COMMUNITY): Payer: Self-pay

## 2023-08-02 ENCOUNTER — Other Ambulatory Visit: Payer: Self-pay

## 2023-08-06 ENCOUNTER — Other Ambulatory Visit: Payer: Self-pay

## 2023-08-06 ENCOUNTER — Other Ambulatory Visit (HOSPITAL_COMMUNITY): Payer: Self-pay

## 2023-08-06 MED ORDER — SERTRALINE HCL 50 MG PO TABS
50.0000 mg | ORAL_TABLET | Freq: Every day | ORAL | 0 refills | Status: DC
  Filled 2023-08-06 (×2): qty 90, 90d supply, fill #0

## 2023-08-09 ENCOUNTER — Other Ambulatory Visit: Payer: Self-pay

## 2023-09-20 ENCOUNTER — Other Ambulatory Visit: Payer: Self-pay

## 2023-09-20 DIAGNOSIS — R002 Palpitations: Secondary | ICD-10-CM

## 2023-09-20 NOTE — Progress Notes (Signed)
 Order for event monitor and labs placed per Dr. Ryan Coyer request.

## 2023-09-21 ENCOUNTER — Ambulatory Visit: Attending: Cardiology

## 2023-09-21 ENCOUNTER — Ambulatory Visit: Admitting: Cardiology

## 2023-09-21 DIAGNOSIS — R002 Palpitations: Secondary | ICD-10-CM | POA: Diagnosis not present

## 2023-09-21 NOTE — Progress Notes (Unsigned)
 [employer] event serial # [id number] from office inventory given to patient. Printout of instruction letter given to patient.

## 2023-10-18 ENCOUNTER — Telehealth: Payer: Self-pay | Admitting: *Deleted

## 2023-10-18 NOTE — Telephone Encounter (Signed)
 Open error

## 2023-10-22 DIAGNOSIS — R002 Palpitations: Secondary | ICD-10-CM

## 2023-10-26 ENCOUNTER — Other Ambulatory Visit: Payer: Self-pay

## 2023-10-26 MED ORDER — SERTRALINE HCL 50 MG PO TABS
50.0000 mg | ORAL_TABLET | Freq: Every day | ORAL | 0 refills | Status: DC
  Filled 2023-10-26 – 2023-11-01 (×2): qty 90, 90d supply, fill #0

## 2023-10-27 ENCOUNTER — Other Ambulatory Visit: Payer: Self-pay

## 2023-10-28 ENCOUNTER — Ambulatory Visit: Payer: Self-pay | Admitting: Cardiology

## 2023-11-01 ENCOUNTER — Other Ambulatory Visit (HOSPITAL_COMMUNITY): Payer: Self-pay

## 2023-11-01 ENCOUNTER — Other Ambulatory Visit: Payer: Self-pay

## 2023-11-16 ENCOUNTER — Other Ambulatory Visit: Payer: Self-pay

## 2023-11-16 ENCOUNTER — Other Ambulatory Visit (HOSPITAL_COMMUNITY): Payer: Self-pay

## 2023-11-16 DIAGNOSIS — F321 Major depressive disorder, single episode, moderate: Secondary | ICD-10-CM | POA: Diagnosis not present

## 2023-11-16 DIAGNOSIS — Z3040 Encounter for surveillance of contraceptives, unspecified: Secondary | ICD-10-CM | POA: Diagnosis not present

## 2023-11-16 DIAGNOSIS — Z1389 Encounter for screening for other disorder: Secondary | ICD-10-CM | POA: Diagnosis not present

## 2023-11-16 DIAGNOSIS — Z01419 Encounter for gynecological examination (general) (routine) without abnormal findings: Secondary | ICD-10-CM | POA: Diagnosis not present

## 2023-11-16 MED ORDER — SERTRALINE HCL 50 MG PO TABS
50.0000 mg | ORAL_TABLET | Freq: Every day | ORAL | 3 refills | Status: AC
  Filled 2024-02-13 – 2024-03-06 (×2): qty 90, 90d supply, fill #0

## 2023-11-16 MED ORDER — NORELGESTROMIN-ETH ESTRADIOL 150-35 MCG/24HR TD PTWK
1.0000 | MEDICATED_PATCH | TRANSDERMAL | 3 refills | Status: AC
  Filled 2023-11-16: qty 9, 84d supply, fill #0
  Filled 2024-02-13 – 2024-03-06 (×2): qty 9, 84d supply, fill #1

## 2023-11-24 ENCOUNTER — Other Ambulatory Visit (HOSPITAL_COMMUNITY): Payer: Self-pay

## 2023-11-24 MED ORDER — CHOLECALCIFEROL 1.25 MG (50000 UT) PO CAPS
50000.0000 [IU] | ORAL_CAPSULE | ORAL | 0 refills | Status: AC
  Filled 2023-11-24 – 2024-03-06 (×3): qty 8, 56d supply, fill #0

## 2023-12-03 ENCOUNTER — Other Ambulatory Visit (HOSPITAL_COMMUNITY): Payer: Self-pay

## 2024-02-10 ENCOUNTER — Other Ambulatory Visit (HOSPITAL_COMMUNITY): Payer: Self-pay

## 2024-02-10 MED ORDER — FLUZONE 0.5 ML IM SUSY
0.5000 mL | PREFILLED_SYRINGE | Freq: Once | INTRAMUSCULAR | 0 refills | Status: AC
  Filled 2024-02-10: qty 0.5, 1d supply, fill #0

## 2024-02-14 ENCOUNTER — Other Ambulatory Visit (HOSPITAL_COMMUNITY): Payer: Self-pay

## 2024-02-24 ENCOUNTER — Other Ambulatory Visit (HOSPITAL_COMMUNITY): Payer: Self-pay

## 2024-03-06 ENCOUNTER — Other Ambulatory Visit: Payer: Self-pay

## 2024-03-06 ENCOUNTER — Other Ambulatory Visit (HOSPITAL_COMMUNITY): Payer: Self-pay

## 2024-03-12 ENCOUNTER — Other Ambulatory Visit (HOSPITAL_COMMUNITY): Payer: Self-pay

## 2024-03-13 ENCOUNTER — Encounter (HOSPITAL_COMMUNITY): Payer: Self-pay

## 2024-03-13 ENCOUNTER — Other Ambulatory Visit (HOSPITAL_COMMUNITY): Payer: Self-pay

## 2024-03-23 ENCOUNTER — Telehealth: Admitting: Physician Assistant

## 2024-03-23 ENCOUNTER — Other Ambulatory Visit (HOSPITAL_COMMUNITY): Payer: Self-pay

## 2024-03-23 DIAGNOSIS — J208 Acute bronchitis due to other specified organisms: Secondary | ICD-10-CM | POA: Diagnosis not present

## 2024-03-23 MED ORDER — PROMETHAZINE-DM 6.25-15 MG/5ML PO SYRP
5.0000 mL | ORAL_SOLUTION | Freq: Four times a day (QID) | ORAL | 0 refills | Status: AC | PRN
  Filled 2024-03-23: qty 118, 6d supply, fill #0

## 2024-03-23 NOTE — Progress Notes (Signed)
 We are sorry that you are not feeling well.  Here is how we plan to help!  Based on your presentation I believe you most likely have A cough due to a virus.  This is called viral bronchitis and is best treated by rest, plenty of fluids and control of the cough.  You may use Ibuprofen  or Tylenol  as directed to help your symptoms.     I have prescribed a promethazine -dm cough syrup to take as directed. Keep up with your nasal steroid spray.  From your responses in the eVisit questionnaire you describe inflammation in the upper respiratory tract which is causing a significant cough.  This is commonly called Bronchitis and has four common causes:   Allergies Viral Infections Acid Reflux Bacterial Infection Allergies, viruses and acid reflux are treated by controlling symptoms or eliminating the cause. An example might be a cough caused by taking certain blood pressure medications. You stop the cough by changing the medication. Another example might be a cough caused by acid reflux. Controlling the reflux helps control the cough.  USE OF BRONCHODILATOR (RESCUE) INHALERS: There is a risk from using your bronchodilator too frequently.  The risk is that over-reliance on a medication which only relaxes the muscles surrounding the breathing tubes can reduce the effectiveness of medications prescribed to reduce swelling and congestion of the tubes themselves.  Although you feel brief relief from the bronchodilator inhaler, your asthma may actually be worsening with the tubes becoming more swollen and filled with mucus.  This can delay other crucial treatments, such as oral steroid medications. If you need to use a bronchodilator inhaler daily, several times per day, you should discuss this with your provider.  There are probably better treatments that could be used to keep your asthma under control.     HOME CARE Only take medications as instructed by your medical team. Complete the entire course of an  antibiotic. Drink plenty of fluids and get plenty of rest. Avoid close contacts especially the very young and the elderly Cover your mouth if you cough or cough into your sleeve. Always remember to wash your hands A steam or ultrasonic humidifier can help congestion.   GET HELP RIGHT AWAY IF: You develop worsening fever. You become short of breath You cough up blood. Your symptoms persist after you have completed your treatment plan MAKE SURE YOU  Understand these instructions. Will watch your condition. Will get help right away if you are not doing well or get worse.  Your e-visit answers were reviewed by a board certified advanced clinical practitioner to complete your personal care plan.  Depending on the condition, your plan could have included both over the counter or prescription medications. If there is a problem please reply  once you have received a response from your provider. Your safety is important to us .  If you have drug allergies check your prescription carefully.    You can use MyChart to ask questions about todays visit, request a non-urgent call back, or ask for a work or school excuse for 24 hours related to this e-Visit. If it has been greater than 24 hours you will need to follow up with your provider, or enter a new e-Visit to address those concerns. You will get an e-mail in the next two days asking about your experience.  I hope that your e-visit has been valuable and will speed your recovery. Thank you for using e-visits.   I have spent 5 minutes in review of  e-visit questionnaire, review and updating patient chart, medical decision making and response to patient.   Elsie Velma Lunger, PA-C
